# Patient Record
Sex: Male | Born: 1975
Health system: Southern US, Community
[De-identification: ages and names within clinical notes are randomized; demographics above are authoritative.]

## PROBLEM LIST (undated history)

## (undated) DIAGNOSIS — T7840XA Allergy, unspecified, initial encounter: Secondary | ICD-10-CM

## (undated) DIAGNOSIS — J45909 Unspecified asthma, uncomplicated: Secondary | ICD-10-CM

## (undated) DIAGNOSIS — L4050 Arthropathic psoriasis, unspecified: Secondary | ICD-10-CM

## (undated) DIAGNOSIS — K219 Gastro-esophageal reflux disease without esophagitis: Secondary | ICD-10-CM

## (undated) DIAGNOSIS — F419 Anxiety disorder, unspecified: Secondary | ICD-10-CM

## (undated) HISTORY — DX: Unspecified asthma, uncomplicated: J45.909

## (undated) HISTORY — DX: Gastro-esophageal reflux disease without esophagitis: K21.9

## (undated) HISTORY — DX: Allergy, unspecified, initial encounter: T78.40XA

## (undated) HISTORY — DX: Anxiety disorder, unspecified: F41.9

## (undated) HISTORY — DX: Arthropathic psoriasis, unspecified: L40.50

---

## 2003-02-26 ENCOUNTER — Emergency Department (HOSPITAL_COMMUNITY): Admission: EM | Admit: 2003-02-26 | Discharge: 2003-02-26 | Payer: Self-pay | Admitting: Emergency Medicine

## 2003-02-26 ENCOUNTER — Encounter: Payer: Self-pay | Admitting: Emergency Medicine

## 2006-05-04 ENCOUNTER — Emergency Department (HOSPITAL_COMMUNITY): Admission: EM | Admit: 2006-05-04 | Discharge: 2006-05-04 | Payer: Self-pay | Admitting: Emergency Medicine

## 2007-03-20 ENCOUNTER — Emergency Department (HOSPITAL_COMMUNITY): Admission: EM | Admit: 2007-03-20 | Discharge: 2007-03-20 | Payer: Self-pay | Admitting: Emergency Medicine

## 2008-05-19 ENCOUNTER — Ambulatory Visit: Payer: Self-pay | Admitting: Family Medicine

## 2008-05-19 DIAGNOSIS — F411 Generalized anxiety disorder: Secondary | ICD-10-CM | POA: Insufficient documentation

## 2008-05-19 DIAGNOSIS — J45909 Unspecified asthma, uncomplicated: Secondary | ICD-10-CM | POA: Insufficient documentation

## 2009-02-07 ENCOUNTER — Ambulatory Visit: Payer: Self-pay | Admitting: Family Medicine

## 2009-02-07 DIAGNOSIS — L723 Sebaceous cyst: Secondary | ICD-10-CM | POA: Insufficient documentation

## 2010-03-07 ENCOUNTER — Ambulatory Visit: Payer: Self-pay | Admitting: Family Medicine

## 2010-03-26 ENCOUNTER — Emergency Department (HOSPITAL_COMMUNITY): Admission: EM | Admit: 2010-03-26 | Discharge: 2010-03-26 | Payer: Self-pay | Admitting: Emergency Medicine

## 2010-09-24 NOTE — Assessment & Plan Note (Signed)
Summary: RENEW-CHECK CYST ON BACK//CCM   Vital Signs:  Patient profile:   35 year old male Height:      72.25 inches Weight:      179 pounds BMI:     24.20 Temp:     98.3 degrees F oral BP sitting:   120 / 80  (left arm) Cuff size:   regular  Vitals Entered By: Kern Reap CMA (February 07, 2009 11:53 AM)  Reason for Visit Concerns,cyst on back x 2-3 years  History of Present Illness: Alan Brooks is a 35 year old male, who comes in today for evaluation of 3 problems.  He has a history of panic attacks.  Is currently on 20 mg of Celexa daily.  The major panic attacks and stopped however, is having many panic attacks.  Once to discuss increasing the medication.  He can't get on an airplane.  He has a bump in the middle of his back he would like evaluated.  He also has a history of asthma.  He's been an intermittent smoker, however, he stopped smoking now for two weeks.  He takes Advair one puff b.i.d..  Allergies (verified): No Known Drug Allergies PMH-FH-SH reviewed-no changes except otherwise noted  Family History: Reviewed history from 05/19/2008 and no changes required. Family History of Anxiety Family History Depression  Social History: Reviewed history from 05/19/2008 and no changes required. Occupation: Single Former Smoker Alcohol use-yes Drug use-no Regular exercise-yes  Review of Systems      See HPI  Physical Exam  General:  Well-developed,well-nourished,in no acute distress; alert,appropriate and cooperative throughout examination Skin:  sebaceous cyst on his back.  The lesion was squeezed the fluid was excised Psych:  Cognition and judgment appear intact. Alert and cooperative with normal attention span and concentration. No apparent delusions, illusions, hallucinations   Impression & Recommendations:  Problem # 1:  ASTHMA (ICD-493.90) Assessment Improved  His updated medication list for this problem includes:    Advair Diskus 250-50 Mcg/dose Misc  (Fluticasone-salmeterol) ..... Use two times a day    Proventil Hfa 108 (90 Base) Mcg/act Aers (Albuterol sulfate) .Marland Kitchen... As needed  Orders: Prescription Created Electronically [phone])  Problem # 2:  ANXIETY (ICD-300.00) Assessment: Improved  His updated medication list for this problem includes:    Celexa 20 Mg Tabs (Citalopram hydrobromide) .Marland Kitchen... 1 tab @ bedtime    Celexa 20 Mg Tabs (Citalopram hydrobromide) .Marland Kitchen... 1 & 1/2 at bedtime  Orders: Prescription Created Electronically [phone])  Problem # 3:  SEBACEOUS CYST (ICD-706.2) Assessment: New  Orders: Prescription Created Electronically [phone])  Complete Medication List: 1)  Advair Diskus 250-50 Mcg/dose Misc (Fluticasone-salmeterol) .... Use two times a day 2)  Proventil Hfa 108 (90 Base) Mcg/act Aers (Albuterol sulfate) .... As needed 3)  Celexa 20 Mg Tabs (Citalopram hydrobromide) .Marland Kitchen.. 1 tab @ bedtime 4)  Celexa 20 Mg Tabs (Citalopram hydrobromide) .Marland Kitchen.. 1 & 1/2 at bedtime  Patient Instructions: 1)  continue the Advair one puff twice a day.  When she is not smoked for a year.  Will discuss stopping the Advair. 2)  Increase the Celexa to 30 mg a day.  If after 3 weeks.  u  don't feel  increase the dose to 40 mg a day.  In other words we will increase it by 10 mg every 3 weeks and until we get to a  max dose of 80 mg daily. Prescriptions: ADVAIR DISKUS 250-50 MCG/DOSE MISC (FLUTICASONE-SALMETEROL) use two times a day  #3 x 3   Entered and  Authorized by:   Roderick Pee MD   Signed by:   Roderick Pee MD on 02/07/2009   Method used:   Print then Give to Patient   RxID:   [id number] CELEXA 20 MG TABS (CITALOPRAM HYDROBROMIDE) 1 & 1/2 at bedtime  #150 x 3   Entered and Authorized by:   Roderick Pee MD   Signed by:   Roderick Pee MD on 02/07/2009   Method used:   Print then Give to Patient   RxID:   [id number]

## 2010-09-24 NOTE — Assessment & Plan Note (Signed)
Summary: new anxiety/mhf   Vital Signs:  Patient Profile:   35 Years Old Male Height:     72.25 inches Weight:      179 pounds Temp:     98.6 degrees F oral Pulse rate:   60 / minute Pulse rhythm:   regular BP sitting:   120 / 80  (left arm) Cuff size:   regular  Vitals Entered By: Kern Reap CMA (May 19, 2008 4:04 PM)                 Chief Complaint:  cpx.  History of Present Illness: Alan Brooks is a 35 year old single male comes in today as a new patient for evaluation of asthma and panic attacks.  He was diagnosed with asthma at age 54.  Four months ago.  He quit smoking.  His asthma has improved.  He currently takes Advair 250 -- 50.  He takes one puff twice a day.  Also, albuterol, one or two puffs p.r.n. he was instructed that two canisters of albuterol in a 20-month basis represents fairly good control in a sling over the knees to be reevaluated.  He was also reassured that his lungs will heal over the next two years and at some point, we may be able to discontinue the Advair.  However, continue it for now.  He had a long history of anxiety and depression and panic attacks.  He's been given Xanax by another physician but it doesn't help.  His spells about once a month.  He also has issues with heights air plane travel.  It one time he tried to treat it with alcohol.  He realized that his work.  Now he only has one or two beers per week.  Does not use any street drugs.  Positive family history for anxiety, depression    Current Allergies: No known allergies   Past Medical History:    Reviewed history and no changes required:       Anxiety       Asthma   Family History:    Family History of Anxiety    Family History Depression  Social History:    Reviewed history and no changes required:       Occupation:       Single       Former Smoker       Alcohol use-yes       Drug use-no       Regular exercise-yes   Risk Factors:  Tobacco use:  quit  Year quit:  09 Drug use:  no Alcohol use:  yes Exercise:  yes   Review of Systems      See HPI   Physical Exam  General:     Well-developed,well-nourished,in no acute distress; alert,appropriate and cooperative throughout examination Head:     Normocephalic and atraumatic without obvious abnormalities. No apparent alopecia or balding. Eyes:     No corneal or conjunctival inflammation noted. EOMI. Perrla. Funduscopic exam benign, without hemorrhages, exudates or papilledema. Vision grossly normal. Ears:     External ear exam shows no significant lesions or deformities.  Otoscopic examination reveals clear canals, tympanic membranes are intact bilaterally without bulging, retraction, inflammation or discharge. Hearing is grossly normal bilaterally. Nose:     External nasal examination shows no deformity or inflammation. Nasal mucosa are pink and moist without lesions or exudates. Mouth:     Oral mucosa and oropharynx without lesions or exudates.  Teeth in good repair. Neck:  No deformities, masses, or tenderness noted. Chest Wall:     No deformities, masses, tenderness or gynecomastia noted. Breasts:     No masses or gynecomastia noted Lungs:     Normal respiratory effort, chest expands symmetrically. Lungs are clear to auscultation, no crackles or wheezes. Heart:     Normal rate and regular rhythm. S1 and S2 normal without gallop, murmur, click, rub or other extra sounds. Psych:     Cognition and judgment appear intact. Alert and cooperative with normal attention span and concentration. No apparent delusions, illusions, hallucinations    Impression & Recommendations:  Problem # 1:  ANXIETY (ICD-300.00) Assessment: New  His updated medication list for this problem includes:    Celexa 20 Mg Tabs (Citalopram hydrobromide) .Marland Kitchen... 1 tab @ bedtime   Problem # 2:  ASTHMA (ICD-493.90) Assessment: New  His updated medication list for this problem includes:    Advair Diskus  250-50 Mcg/dose Misc (Fluticasone-salmeterol) ..... Use two times a day    Proventil Hfa 108 (90 Base) Mcg/act Aers (Albuterol sulfate) .Marland Kitchen... As needed   Complete Medication List: 1)  Advair Diskus 250-50 Mcg/dose Misc (Fluticasone-salmeterol) .... Use two times a day 2)  Proventil Hfa 108 (90 Base) Mcg/act Aers (Albuterol sulfate) .... As needed 3)  Celexa 20 Mg Tabs (Citalopram hydrobromide) .Marland Kitchen.. 1 tab @ bedtime   Patient Instructions: 1)  begin Celexa 20 mg one tablet at bedtime.  If after 4 weeks your feeling well.  Continue that dose and return to see me in one year.  If not, we increase by 10 mg per month until either the medication helps or we get to a max dose of 80 mg daily.  If you need to increase the medication and need a new prescription call and leave a Engineer, technical sales for [place]. 2)  Also, continue the Advair 1 puff b.i.d., and the Proventil p.r.n. remember, less than two canisters of albuterol in a 12 month time frame indicates good control   Prescriptions: PROVENTIL HFA 108 (90 BASE) MCG/ACT AERS (ALBUTEROL SULFATE) as needed  #1 x 1   Entered and Authorized by:   Roderick Pee MD   Signed by:   Roderick Pee MD on 05/19/2008   Method used:   Print then Give to Patient   RxID:   [id number] ADVAIR DISKUS 250-50 MCG/DOSE MISC (FLUTICASONE-SALMETEROL) use two times a day  #3 x 3   Entered and Authorized by:   Roderick Pee MD   Signed by:   Roderick Pee MD on 05/19/2008   Method used:   Print then Give to Patient   RxID:   [id number] CELEXA 20 MG TABS (CITALOPRAM HYDROBROMIDE) 1 tab @ bedtime  #100 x 3   Entered and Authorized by:   Roderick Pee MD   Signed by:   Roderick Pee MD on 05/19/2008   Method used:   Print then Give to Patient   RxID:   [id number]  ]

## 2010-09-24 NOTE — Assessment & Plan Note (Signed)
Summary: fu on meds/njr   Vital Signs:  Patient profile:   35 year old male Weight:      188 pounds BMI:     25.41 Temp:     98.4 degrees F oral BP sitting:   120 / 80  (left arm) Cuff size:   regular  Vitals Entered By: Kern Reap CMA Duncan Dull) (March 07, 2010 1:47 PM) CC: follow-up visit   CC:  follow-up visit.  History of Present Illness: Alan Brooks is a 35 year old male, who comes in today for evaluation of asthma and mild anxiety attacks.  He began having asthma  when he was about 35 years of age.  Two years ago.  He tried stopping his medication however, his asthma returned.  He currently uses Advair 250 50,,,,, 1 puff  b.i.d., and only uses albuterol p.r.n. in fact, he only uses one can every 12 months.  He also takes Celexa 30 mg daily because of panic attacks.  Allergies: No Known Drug Allergies  Past History:  Past medical, surgical, family and social histories (including risk factors) reviewed, and no changes noted (except as noted below).  Past Medical History: Reviewed history from 05/19/2008 and no changes required. Anxiety Asthma  Family History: Reviewed history from 05/19/2008 and no changes required. Family History of Anxiety Family History Depression  Social History: Reviewed history from 05/19/2008 and no changes required. Occupation: Single Former Smoker Alcohol use-yes Drug use-no Regular exercise-yes  Review of Systems      See HPI  Physical Exam  General:  Well-developed,well-nourished,in no acute distress; alert,appropriate and cooperative throughout examination Neck:  No deformities, masses, or tenderness noted. Lungs:  Normal respiratory effort, chest expands symmetrically. Lungs are clear to auscultation, no crackles or wheezes. Psych:  Cognition and judgment appear intact. Alert and cooperative with normal attention span and concentration. No apparent delusions, illusions, hallucinations   Impression & Recommendations:  Problem #  1:  ASTHMA (ICD-493.90) Assessment Improved  His updated medication list for this problem includes:    Advair Diskus 250-50 Mcg/dose Misc (Fluticasone-salmeterol) ..... Use two times a day    Proventil Hfa 108 (90 Base) Mcg/act Aers (Albuterol sulfate) .Marland Kitchen... As needed  Orders: Prescription Created Electronically [phone])  Problem # 2:  ANXIETY (ICD-300.00) Assessment: Improved  The following medications were removed from the medication list:    Celexa 20 Mg Tabs (Citalopram hydrobromide) .Marland Kitchen... 1 tab @ bedtime His updated medication list for this problem includes:    Celexa 20 Mg Tabs (Citalopram hydrobromide) .Marland Kitchen... 1 & 1/2 at bedtime  Orders: Prescription Created Electronically [phone])  Complete Medication List: 1)  Advair Diskus 250-50 Mcg/dose Misc (Fluticasone-salmeterol) .... Use two times a day 2)  Proventil Hfa 108 (90 Base) Mcg/act Aers (Albuterol sulfate) .... As needed 3)  Celexa 20 Mg Tabs (Citalopram hydrobromide) .Marland Kitchen.. 1 & 1/2 at bedtime  Patient Instructions: 1)  try decreasing the Advair to one puff daily.  If however, he began to have more symptoms, increased back to one puff twice daily. 2)  Continue the Celexa as directed. 3)  Return in one year for follow-up/prn  4)  Remember to get your flu shot in the fall Prescriptions: CELEXA 20 MG TABS (CITALOPRAM HYDROBROMIDE) 1 & 1/2 at bedtime  #150 x 3   Entered and Authorized by:   Roderick Pee MD   Signed by:   Roderick Pee MD on 03/07/2010   Method used:   Electronically to        [facility]  [address] [phone]* (retail)       [address]       [place], [place]  [address]       Ph: [phone] or [phone]       Fax: [phone]   RxID:   [id number] PROVENTIL HFA 108 (90 BASE) MCG/ACT AERS (ALBUTEROL SULFATE) as needed  #6.7 Inhaler x 1   Entered and Authorized by:   Roderick Pee MD   Signed by:   Roderick Pee MD on 03/07/2010   Method used:   [facility]ronically to        [facility]  [address]  [phone]* (retail)       [address]       [place], [place]  [address]       Ph: [phone] or [phone]       Fax: [phone]   RxID:   [id number] ADVAIR DISKUS 250-50 MCG/DOSE MISC (FLUTICASONE-SALMETEROL) use two times a day  #3 units x 4   Entered and Authorized by:   Roderick Pee MD   Signed by:   Roderick Pee MD on 03/07/2010   Method used:   [facility]        [facility]  [address] [phone]* (retail)       [address]       [place], [place]  [address]       Ph: [phone] or [phone]       Fax: [phone]   RxID:   [id number]

## 2010-12-06 ENCOUNTER — Other Ambulatory Visit: Payer: Self-pay | Admitting: Family Medicine

## 2011-03-13 ENCOUNTER — Other Ambulatory Visit: Payer: Self-pay | Admitting: Family Medicine

## 2011-06-25 ENCOUNTER — Other Ambulatory Visit: Payer: Self-pay | Admitting: Family Medicine

## 2011-08-04 ENCOUNTER — Ambulatory Visit (INDEPENDENT_AMBULATORY_CARE_PROVIDER_SITE_OTHER): Payer: BC Managed Care – PPO

## 2011-08-04 DIAGNOSIS — S8010XA Contusion of unspecified lower leg, initial encounter: Secondary | ICD-10-CM

## 2011-08-04 DIAGNOSIS — M79609 Pain in unspecified limb: Secondary | ICD-10-CM

## 2011-11-18 ENCOUNTER — Encounter: Payer: Self-pay | Admitting: Family Medicine

## 2011-11-18 ENCOUNTER — Ambulatory Visit (INDEPENDENT_AMBULATORY_CARE_PROVIDER_SITE_OTHER): Payer: BC Managed Care – PPO | Admitting: Family Medicine

## 2011-11-18 VITALS — BP 110/76 | Temp 98.1°F | Ht 73.0 in | Wt 184.0 lb

## 2011-11-18 DIAGNOSIS — F17200 Nicotine dependence, unspecified, uncomplicated: Secondary | ICD-10-CM

## 2011-11-18 DIAGNOSIS — Z23 Encounter for immunization: Secondary | ICD-10-CM

## 2011-11-18 DIAGNOSIS — J45909 Unspecified asthma, uncomplicated: Secondary | ICD-10-CM

## 2011-11-18 DIAGNOSIS — Z720 Tobacco use: Secondary | ICD-10-CM

## 2011-11-18 LAB — BASIC METABOLIC PANEL
BUN: 11 mg/dL (ref 6–23)
CO2: 28 mEq/L (ref 19–32)
Calcium: 9.4 mg/dL (ref 8.4–10.5)
Chloride: 103 mEq/L (ref 96–112)
Creatinine, Ser: 0.9 mg/dL (ref 0.4–1.5)
GFR: 97.72 mL/min (ref >60.00)
Glucose, Bld: 109 mg/dL — ABNORMAL HIGH (ref 70–99)
Potassium: 4.3 mEq/L (ref 3.5–5.1)
Sodium: 141 mEq/L (ref 135–145)

## 2011-11-18 LAB — POCT URINALYSIS DIPSTICK
Bilirubin, UA: NEGATIVE
Blood, UA: NEGATIVE
Glucose, UA: NEGATIVE
Ketones, UA: NEGATIVE
Leukocytes, UA: NEGATIVE
Nitrite, UA: NEGATIVE
Protein, UA: NEGATIVE
Spec Grav, UA: 1.025
Urobilinogen, UA: 0.2
pH, UA: 6.5

## 2011-11-18 LAB — LIPID PANEL
Cholesterol: 176 mg/dL (ref 0–200)
HDL: 44.5 mg/dL (ref >39.00)
LDL Cholesterol: 108 mg/dL — ABNORMAL HIGH (ref 0–99)
Total CHOL/HDL Ratio: 4
Triglycerides: 119 mg/dL (ref 0.0–149.0)
VLDL: 23.8 mg/dL (ref 0.0–40.0)

## 2011-11-18 LAB — HEPATIC FUNCTION PANEL
ALT: 18 U/L (ref 0–53)
AST: 22 U/L (ref 0–37)
Albumin: 4.2 g/dL (ref 3.5–5.2)
Alkaline Phosphatase: 50 U/L (ref 39–117)
Bilirubin, Direct: 0 mg/dL (ref 0.0–0.3)
Total Bilirubin: 0.5 mg/dL (ref 0.3–1.2)
Total Protein: 7.4 g/dL (ref 6.0–8.3)

## 2011-11-18 LAB — CBC WITH DIFFERENTIAL/PLATELET
Basophils Absolute: 0 10*3/uL (ref 0.0–0.1)
Basophils Relative: 0.6 % (ref 0.0–3.0)
Eosinophils Absolute: 0.6 10*3/uL (ref 0.0–0.7)
Eosinophils Relative: 9 % — ABNORMAL HIGH (ref 0.0–5.0)
HCT: 46.8 % (ref 39.0–52.0)
Hemoglobin: 15.7 g/dL (ref 13.0–17.0)
Lymphocytes Relative: 26.9 % (ref 12.0–46.0)
Lymphs Abs: 1.7 10*3/uL (ref 0.7–4.0)
MCHC: 33.5 g/dL (ref 30.0–36.0)
MCV: 98.5 fl (ref 78.0–100.0)
Monocytes Absolute: 0.5 10*3/uL (ref 0.1–1.0)
Monocytes Relative: 7.6 % (ref 3.0–12.0)
Neutro Abs: 3.5 10*3/uL (ref 1.4–7.7)
Neutrophils Relative %: 55.9 % (ref 43.0–77.0)
Platelets: 200 10*3/uL (ref 150.0–400.0)
RBC: 4.75 Mil/uL (ref 4.22–5.81)
RDW: 13.6 % (ref 11.5–14.6)
WBC: 6.2 10*3/uL (ref 4.5–10.5)

## 2011-11-18 LAB — TSH: TSH: 1.09 u[IU]/mL (ref 0.35–5.50)

## 2011-11-18 MED ORDER — VARENICLINE TARTRATE 1 MG PO TABS: ORAL_TABLET | ORAL | Status: DC

## 2011-11-18 MED ORDER — CITALOPRAM HYDROBROMIDE 20 MG PO TABS: ORAL_TABLET | ORAL | Status: DC

## 2011-11-18 MED ORDER — FLUTICASONE-SALMETEROL 250-50 MCG/DOSE IN AEPB: 1.0000 | INHALATION_SPRAY | Freq: Two times a day (BID) | RESPIRATORY_TRACT | Status: DC

## 2011-11-18 MED ORDER — ALBUTEROL SULFATE HFA 108 (90 BASE) MCG/ACT IN AERS: 2.0000 | INHALATION_SPRAY | Freq: Four times a day (QID) | RESPIRATORY_TRACT | Status: DC | PRN

## 2011-11-18 NOTE — Patient Instructions (Signed)
Increase the Advair to one puff twice daily  Continue the Celexa  Start the Chantix program one half tab daily in the morning and taper as outlined  Return in 6 weeks for followup

## 2011-11-18 NOTE — Progress Notes (Signed)
  Subjective:    Patient ID: Alan Brooks, male    DOB: [birth year], 36 y.o.   MRN: [id number]  HPI Alan Brooks is a 36 year old male who comes in today for general physical examination because of a history of allergic rhinitis, mild depression, and tobacco abuse  He's currently using Advair 1 puff daily and albuterol when necessary he continues to smoke 20 cigarettes a day. We will outline a smoking cessation program  He takes Celexa 30 mg each bedtime for mild depression and states he's doing well emotionally.  Tetanus booster unknown given today   Review of Systems  Constitutional: Negative.   HENT: Negative.   Eyes: Negative.   Respiratory: Negative.   Cardiovascular: Negative.   Gastrointestinal: Negative.   Genitourinary: Negative.   Musculoskeletal: Negative.   Skin: Negative.   Neurological: Negative.   Hematological: Negative.   Psychiatric/Behavioral: Negative.        Objective:   Physical Exam  Constitutional: He is oriented to person, place, and time. He appears well-developed and well-nourished.  HENT:  Head: Normocephalic and atraumatic.  Right Ear: External ear normal.  Left Ear: External ear normal.  Nose: Nose normal.  Mouth/Throat: Oropharynx is clear and moist.  Eyes: Conjunctivae and EOM are normal. Pupils are equal, round, and reactive to light.  Neck: Normal range of motion. Neck supple. No JVD present. No tracheal deviation present. No thyromegaly present.  Cardiovascular: Normal rate, regular rhythm, normal heart sounds and intact distal pulses.  Exam reveals no gallop and no friction rub.   No murmur heard. Pulmonary/Chest: Effort normal and breath sounds normal. No stridor. No respiratory distress. He has no wheezes. He has no rales. He exhibits no tenderness.  Abdominal: Soft. Bowel sounds are normal. He exhibits no distension and no mass. There is no tenderness. There is no rebound and no guarding.  Genitourinary: Penis normal. No penile  tenderness.  Musculoskeletal: Normal range of motion. He exhibits no edema and no tenderness.  Lymphadenopathy:    He has no cervical adenopathy.  Neurological: He is alert and oriented to person, place, and time. He has normal reflexes. No cranial nerve deficit. He exhibits normal muscle tone.  Skin: Skin is warm and dry. No rash noted. No erythema. No pallor.  Psychiatric: He has a normal mood and affect. His behavior is normal. Judgment and thought content normal.   Lungs clear except for some mild late expiratory wheezing       Assessment & Plan:  Healthy male  History of allergic asthma plan increase Advair to one puff twice daily smoking cessation program outlined followup in 6 weeks  History of mild depression continue Celexa 30 mg daily

## 2011-11-20 ENCOUNTER — Telehealth: Payer: Self-pay | Admitting: Family Medicine

## 2011-11-20 NOTE — Telephone Encounter (Signed)
Patient is aware of lab results and copy mailed 

## 2011-11-20 NOTE — Telephone Encounter (Signed)
Pt would like blood work results °

## 2011-12-30 ENCOUNTER — Ambulatory Visit: Payer: BC Managed Care – PPO | Admitting: Family Medicine

## 2012-01-08 ENCOUNTER — Ambulatory Visit (INDEPENDENT_AMBULATORY_CARE_PROVIDER_SITE_OTHER): Payer: BC Managed Care – PPO | Admitting: Family Medicine

## 2012-01-08 ENCOUNTER — Encounter: Payer: Self-pay | Admitting: Family Medicine

## 2012-01-08 VITALS — BP 102/68 | Temp 97.7°F | Wt 180.0 lb

## 2012-01-08 DIAGNOSIS — F17200 Nicotine dependence, unspecified, uncomplicated: Secondary | ICD-10-CM

## 2012-01-08 DIAGNOSIS — J45909 Unspecified asthma, uncomplicated: Secondary | ICD-10-CM

## 2012-01-08 DIAGNOSIS — G5602 Carpal tunnel syndrome, left upper limb: Secondary | ICD-10-CM

## 2012-01-08 DIAGNOSIS — Z720 Tobacco use: Secondary | ICD-10-CM

## 2012-01-08 DIAGNOSIS — G5600 Carpal tunnel syndrome, unspecified upper limb: Secondary | ICD-10-CM

## 2012-01-08 NOTE — Progress Notes (Signed)
  Subjective:    Patient ID: Alan Brooks, male    DOB: 05/07/1976, 36 y.o.   MRN: [id number]  HPI Alan Brooks is a 36 year old male smoker with asthma who comes in today for followup  We have placed him on a smoking cessation program and a half a tablet Chantix daily he has decreased his tobacco consumption down to 10 cigarettes a day  He also has tingling in his left index and middle finger. 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  He also has a history of asthma and is on Advair 1 puff twice a day albuterol when necessary and still having issues of asthma because of the fact that he continues to smoke and also the extrinsic triggers of asthma which are the pollen.   Review of Systems General and pulmonary and neurologic review of systems otherwise negative    Objective:   Physical Exam  Well-developed and nourished man in acute distress      Assessment & Plan:  Tobacco abuse increase the Chantix to one half tablet twice daily as outlined a tapering program followup in 6 weeks  Carpal tunnel syndrome night splint  Asthma continue inhalers followup in 6 weeks

## 2012-01-08 NOTE — Patient Instructions (Signed)
Increase the Chantix to one half tablet twice daily  Taper by 2 cigarettes per week  Followup in 6 weeks  Continue your inhalers  Wear the night splint on your left arm nightly

## 2012-02-19 ENCOUNTER — Ambulatory Visit (INDEPENDENT_AMBULATORY_CARE_PROVIDER_SITE_OTHER): Payer: BC Managed Care – PPO | Admitting: Family Medicine

## 2012-02-19 ENCOUNTER — Encounter: Payer: Self-pay | Admitting: Family Medicine

## 2012-02-19 VITALS — BP 100/70 | Temp 98.1°F | Wt 186.0 lb

## 2012-02-19 DIAGNOSIS — J45909 Unspecified asthma, uncomplicated: Secondary | ICD-10-CM

## 2012-02-19 DIAGNOSIS — G5600 Carpal tunnel syndrome, unspecified upper limb: Secondary | ICD-10-CM

## 2012-02-19 DIAGNOSIS — Z720 Tobacco use: Secondary | ICD-10-CM

## 2012-02-19 DIAGNOSIS — F17200 Nicotine dependence, unspecified, uncomplicated: Secondary | ICD-10-CM

## 2012-02-19 DIAGNOSIS — G5602 Carpal tunnel syndrome, left upper limb: Secondary | ICD-10-CM

## 2012-02-19 NOTE — Progress Notes (Signed)
  Subjective:    Patient ID: Alan Brooks, male    DOB: 1976/05/11, 36 y.o.   MRN: [id number]  HPI Alan Brooks is a 36 year old male who comes in today for followup of smoking cessation  He has a history of underlying allergic rhinitis and asthma and we started him on a smoking cessation program 2 months ago. He's on Chantix program one half tablet twice daily and has been off nicotine completely for 4 weeks except for one day when he smokes 10 cigarettes.  He's wearing his splint for the carpal tunnel syndrome   Review of Systems General and pulmonary review of systems otherwise negative will    Objective:   Physical Exam Well-developed well-nourished male in no acute distress       Assessment & Plan:  Tobacco abuse plan Taper [facility] continue avoiding nicotine products

## 2012-02-19 NOTE — Patient Instructions (Addendum)
Chantix program,,,,,,,,,,, one half tablet twice daily for one month then one half tablet daily for one month then one half tablet Monday Wednesday Friday for one month  Return when necessary

## 2013-01-18 ENCOUNTER — Other Ambulatory Visit: Payer: Self-pay | Admitting: Family Medicine

## 2013-03-02 ENCOUNTER — Other Ambulatory Visit: Payer: Self-pay | Admitting: Family Medicine

## 2013-03-09 ENCOUNTER — Ambulatory Visit: Payer: BC Managed Care – PPO

## 2013-03-09 ENCOUNTER — Ambulatory Visit (INDEPENDENT_AMBULATORY_CARE_PROVIDER_SITE_OTHER): Payer: BC Managed Care – PPO | Admitting: Family Medicine

## 2013-03-09 VITALS — BP 110/78 | HR 88 | Temp 98.0°F | Resp 16 | Ht 72.0 in | Wt 183.0 lb

## 2013-03-09 DIAGNOSIS — S50311A Abrasion of right elbow, initial encounter: Secondary | ICD-10-CM

## 2013-03-09 DIAGNOSIS — M25521 Pain in right elbow: Secondary | ICD-10-CM

## 2013-03-09 DIAGNOSIS — IMO0002 Reserved for concepts with insufficient information to code with codable children: Secondary | ICD-10-CM

## 2013-03-09 DIAGNOSIS — M25529 Pain in unspecified elbow: Secondary | ICD-10-CM
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
## 2013-04-13 ENCOUNTER — Other Ambulatory Visit: Payer: Self-pay | Admitting: Family Medicine

## 2013-04-14 ENCOUNTER — Encounter: Payer: Self-pay | Admitting: Internal Medicine

## 2013-04-14 ENCOUNTER — Ambulatory Visit (INDEPENDENT_AMBULATORY_CARE_PROVIDER_SITE_OTHER): Payer: BC Managed Care – PPO | Admitting: Internal Medicine

## 2013-04-14 VITALS — BP 120/90 | HR 119 | Temp 98.4°F | Wt 186.0 lb

## 2013-04-14 DIAGNOSIS — F17200 Nicotine dependence, unspecified, uncomplicated: Secondary | ICD-10-CM

## 2013-04-14 DIAGNOSIS — M7021 Olecranon bursitis, right elbow: Secondary | ICD-10-CM

## 2013-04-14 DIAGNOSIS — J45909 Unspecified asthma, uncomplicated: Secondary | ICD-10-CM

## 2013-04-14 DIAGNOSIS — M7020 Olecranon bursitis, unspecified elbow: Secondary | ICD-10-CM

## 2013-04-14 DIAGNOSIS — Z720 Tobacco use: Secondary | ICD-10-CM

## 2013-04-14 MED ORDER — VARENICLINE TARTRATE 1 MG PO TABS: ORAL_TABLET | ORAL | Status: DC

## 2013-04-14 NOTE — Patient Instructions (Addendum)
Take Aleve 200 mg twice daily for pain or swelling  Call or return to clinic prn if these symptoms worsen or fail to improve as anticipated.  

## 2013-04-14 NOTE — Progress Notes (Signed)
  Subjective:    Patient ID: Alan Brooks, male    DOB: [birth year], 37 y.o.   MRN: [id number]  HPI  37 year old patient who sustained trauma to the right elbow approximately 6 weeks ago. He had x-rays performed on July 16 which were negative for acute fracture. He has had persistent discomfort and swelling about the right elbow.  X-rays were reviewed  Past Medical History  Diagnosis Date  . Anxiety   . Asthma     History   Social History  . Marital Status: Single    Spouse Name: N/A    Number of Children: N/A  . Years of Education: N/A   Occupational History  . Not on file.   Social History Main Topics  . Smoking status: Current Every Day Smoker -- 1.00 packs/day for 15 years    Types: Cigarettes  . Smokeless tobacco: Not on file  . Alcohol Use: Yes  . Drug Use: No  . Sexual Activity: Not on file   Other Topics Concern  . Not on file   Social History Narrative  . No narrative on file    History reviewed. No pertinent past surgical history.  Family History  Problem Relation Age of Onset  . Asthma Other   . Depression Other   . Anxiety disorder Other     Not on File  Current Outpatient Prescriptions on File Prior to Visit  Medication Sig Dispense Refill  . ADVAIR DISKUS 250-50 MCG/DOSE AEPB INHALE 1 PUFF INTO THE LUNGS 2 (TWO) TIMES DAILY.  180 each  2  . citalopram (CELEXA) 20 MG tablet TAKE 1 AND 1/2 TABLETS BY MOUTH AT BEDTIME  150 tablet  0  . PROVENTIL HFA 108 (90 BASE) MCG/ACT inhaler USE AS DIRECTED AS NEEDED  20.1 each  0   No current facility-administered medications on file prior to visit.    BP 120/90  Pulse 119  Temp(Src) 98.4 F (36.9 C) (Oral)  Wt 186 lb (84.369 kg)  BMI 25.22 kg/m2  SpO2 97%       Review of Systems  Musculoskeletal:       Right elbow pain and swelling       Objective:   Physical Exam  Constitutional: He appears well-developed and well-nourished. No distress.  Musculoskeletal:  The right olecranon  bursa was slightly swollen, mildly tender. No erythema or excessive warmth          Assessment & Plan:   Traumatic olecranon bursitis.  The patient will attempt to avoid overuse activities. He'll take Aleve twice daily. If no improvement in 2-3 weeks or symptoms worsen he will be set up for orthopedic evaluation or cortisone injection

## 2013-05-16 ENCOUNTER — Ambulatory Visit (INDEPENDENT_AMBULATORY_CARE_PROVIDER_SITE_OTHER): Payer: BC Managed Care – PPO | Admitting: Family Medicine

## 2013-05-16 ENCOUNTER — Other Ambulatory Visit: Payer: Self-pay | Admitting: Family Medicine

## 2013-05-16 ENCOUNTER — Encounter: Payer: Self-pay | Admitting: Family Medicine

## 2013-05-16 VITALS — BP 112/72 | HR 70 | Temp 97.8°F | Ht 72.0 in | Wt 186.0 lb

## 2013-05-16 DIAGNOSIS — J45909 Unspecified asthma, uncomplicated: Secondary | ICD-10-CM

## 2013-05-16 DIAGNOSIS — J209 Acute bronchitis, unspecified: Secondary | ICD-10-CM

## 2013-05-16 DIAGNOSIS — L219 Seborrheic dermatitis, unspecified: Secondary | ICD-10-CM

## 2013-05-16 DIAGNOSIS — Z720 Tobacco use: Secondary | ICD-10-CM

## 2013-05-16 DIAGNOSIS — F17200 Nicotine dependence, unspecified, uncomplicated: Secondary | ICD-10-CM

## 2013-05-16 MED ORDER — TRIAMCINOLONE ACETONIDE 0.1 % EX CREA: TOPICAL_CREAM | Freq: Two times a day (BID) | CUTANEOUS | Status: DC

## 2013-05-16 MED ORDER — AZITHROMYCIN 250 MG PO TABS: ORAL_TABLET | ORAL | Status: AC

## 2013-05-16 MED ORDER — VARENICLINE TARTRATE 1 MG PO TABS: 1.0000 mg | ORAL_TABLET | Freq: Two times a day (BID) | ORAL | Status: DC

## 2013-05-16 MED ORDER — VARENICLINE TARTRATE 1 MG PO TABS: ORAL_TABLET | ORAL | Status: DC

## 2013-05-16 NOTE — Progress Notes (Signed)
  Subjective:    Patient ID: Alan Brooks, male    DOB: 11-03-1975, 37 y.o.   MRN: [id number]  HPI Patient seen with several issues as follows  4 to five-day history of cough. Productive of green to brown sputum. No fevers or chills. No dyspnea. He has asthma treated with Advair. No significant wheezing. Minimal nasal congestion. Typical cold-like symptoms of onset 4-5 days ago.  Patient has history of seborrhea. He has areas of involvement including scalp, face, anterior chest. He's tried anti-seborrhea shampoos without much improvement. His intermittent rash is pruritic.  Patient trying to quit smoking. Currently on Chantix but needs refill. No adverse side effects. Currently smoking about one half pack cigarettes per day. Motivation to quit is moderate to high  Past Medical History  Diagnosis Date  . Anxiety   . Asthma    No past surgical history on file.  reports that he has been smoking Cigarettes.  He has a 15 pack-year smoking history. He does not have any smokeless tobacco history on file. He reports that  drinks alcohol. He reports that he does not use illicit drugs. family history includes Anxiety disorder in his other; Asthma in his other; Depression in his other. No Known Allergies    Review of Systems  Constitutional: Negative for fever, chills, activity change, appetite change, fatigue and unexpected weight change.  HENT: Positive for congestion and postnasal drip. Negative for ear pain, sore throat and trouble swallowing.   Respiratory: Positive for cough. Negative for shortness of breath, wheezing and stridor.   Cardiovascular: Negative for chest pain and leg swelling.  Gastrointestinal: Negative for abdominal pain.  Musculoskeletal: Negative for arthralgias.  Skin: Negative for rash.  Neurological: Negative for syncope and headaches.  Hematological: Negative for adenopathy.       Objective:   Physical Exam  Constitutional: He appears well-developed and  well-nourished.  HENT:  Right Ear: External ear normal.  Left Ear: External ear normal.  Mouth/Throat: Oropharynx is clear and moist.  Neck: Neck supple.  Cardiovascular: Normal rate and regular rhythm.   Pulmonary/Chest: Effort normal and breath sounds normal. No respiratory distress. He has no wheezes. He has no rales.  Skin:  Patient has non-scaly slightly erythematous minimally raised rash anterior chest          Assessment & Plan:  #1 acute bronchitis. We explained this is probably viral. No antibiotics indicated at this time but if productive cough persist consider Zithromax #2 seborrheic eczema. We discussed over-the-counter anti-seborrhea shampoos. Add triamcinolone 0.1% cream to affected areas of skin twice daily as needed #3 smoking cessation. Refill of Chantix given

## 2013-05-16 NOTE — Patient Instructions (Signed)

## 2013-10-08 ENCOUNTER — Other Ambulatory Visit: Payer: Self-pay | Admitting: Family Medicine

## 2013-12-08 ENCOUNTER — Telehealth: Payer: Self-pay | Admitting: Family Medicine

## 2013-12-08 MED ORDER — CITALOPRAM HYDROBROMIDE 40 MG PO TABS: 40.0000 mg | ORAL_TABLET | Freq: Every day | ORAL | Status: DC

## 2013-12-08 NOTE — Telephone Encounter (Signed)
Okay per Dr Todd and patient is aware. 

## 2013-12-08 NOTE — Telephone Encounter (Signed)
Pt states his psychologist Hilma FavorsMary Ann Garcia advised pt he needs to increase his citalopram (CELEXA) 30 MG tablet  to 40 mg. Would like dr todd to do this. [facility] [address]

## 2014-06-13 ENCOUNTER — Ambulatory Visit (INDEPENDENT_AMBULATORY_CARE_PROVIDER_SITE_OTHER): Payer: BC Managed Care – PPO

## 2014-06-13 ENCOUNTER — Ambulatory Visit (INDEPENDENT_AMBULATORY_CARE_PROVIDER_SITE_OTHER): Payer: BC Managed Care – PPO | Admitting: Family Medicine

## 2014-06-13 VITALS — BP 100/72 | HR 82 | Temp 98.4°F | Resp 14 | Ht 74.0 in | Wt 200.8 lb

## 2014-06-13 DIAGNOSIS — R079 Chest pain, unspecified: Secondary | ICD-10-CM

## 2014-06-13 NOTE — Progress Notes (Signed)
° °  Subjective:  This chart was scribed for Elvina SidleKurt Lauenstein, MD by Haywood PaoNadim Abu Hashem, ED Scribe. The patient was seen in Exam Room 2 info not found and the patient's care was started at 5:23 PM.   Patient ID: Alan Brooks, male    DOB: [birth year], 38 y.o.   MRN: [id number]  HPI HPI Comments: Alan Brooks is a 38 y.o. male [facility] of centralized CP. Pt notes the pain is primarily in his lungs. He takes Advil once day which he notes provides some relief. Pt does not work out. Pt notes his brother has asthma. He says he used to smoke but quit one year ago. He denies any swelling in his legs or calves. 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 Negative for leg swelling.  All other systems reviewed and are negative.      Objective:   Physical Exam  Nursing note and vitals reviewed. Constitutional: He is oriented to person, place, and time. He appears well-developed and well-nourished.  HENT:  Head: Normocephalic and atraumatic.  Eyes: EOM are normal.  Neck: Normal range of motion.  Cardiovascular: Normal rate, regular rhythm and normal heart sounds.  Exam reveals no friction rub.   No murmur heard. Pulmonary/Chest: Effort normal.  Lungs clear bilaterally   Musculoskeletal: Normal range of motion.  No calve tenderness  Neurological: He is alert and oriented to person, place, and time.  Skin: Skin is warm and dry.  Psychiatric: He has a normal mood and affect. His behavior is normal.   BP 100/72   Pulse 82   Temp(Src) 98.4 F (36.9 C) (Oral)   Resp 14   Ht 6\' 2"  (1.88 m)   Wt 200 lb 12.8 oz (91.082 kg)   BMI 25.77 kg/m2   SpO2 100%  [facility] reading (PRIMARY) by  Dr. Milus GlazierLauenstein:  No acute changes.       Assessment & Plan:  I personally performed the services described in this documentation, which was scribed in my presence. The recorded information has been reviewed and is  accurate.  Symptoms are consistent with a mucous plug. I explained this to the patient and asked him to increase the Advair to twice a day per week. These episodes of acute right-sided chest pain should resolve over the next week or he should return for further evaluation. There are no signs at this is a clot, pneumonia, or pneumothorax.  Signed, Sheila OatsKurt Lauenstein M.D.

## 2014-07-01 ENCOUNTER — Other Ambulatory Visit: Payer: Self-pay | Admitting: Family Medicine

## 2014-09-27 ENCOUNTER — Encounter: Payer: Self-pay | Admitting: Family Medicine

## 2014-09-27 ENCOUNTER — Ambulatory Visit (INDEPENDENT_AMBULATORY_CARE_PROVIDER_SITE_OTHER): Payer: BLUE CROSS/BLUE SHIELD | Admitting: Family Medicine

## 2014-09-27 VITALS — BP 120/80 | Temp 97.9°F | Wt 198.0 lb

## 2014-09-27 DIAGNOSIS — M545 Low back pain, unspecified: Secondary | ICD-10-CM | POA: Insufficient documentation

## 2014-09-27 DIAGNOSIS — L719 Rosacea, unspecified: Secondary | ICD-10-CM

## 2014-09-27 DIAGNOSIS — R1310 Dysphagia, unspecified: Secondary | ICD-10-CM

## 2014-09-27 DIAGNOSIS — M5441 Lumbago with sciatica, right side: Secondary | ICD-10-CM

## 2014-09-27 DIAGNOSIS — F411 Generalized anxiety disorder: Secondary | ICD-10-CM

## 2014-09-27 DIAGNOSIS — J4530 Mild persistent asthma, uncomplicated: Secondary | ICD-10-CM

## 2014-09-27 MED ORDER — DOXYCYCLINE HYCLATE 100 MG PO TABS: 100.0000 mg | ORAL_TABLET | Freq: Two times a day (BID) | ORAL | Status: DC

## 2014-09-27 MED ORDER — METRONIDAZOLE 1 % EX GEL: Freq: Every day | CUTANEOUS | Status: DC

## 2014-09-27 NOTE — Patient Instructions (Signed)
Soft diet  We will get you set up a consult in GI ASAP  For the rosacea........... avoid spicy foods..... Minimal amounts of caffeine and alcohol......... doxycycline one twice daily and MetroGel apply bedtime and wash off in the morning  Return in 4 weeks for reevaluation  You have what's called mechanical low back pain...Marland Kitchen.Marland Kitchen.Marland Kitchen. the treatment of which is follows......... daily Williams exercises every morning...........Marland Kitchen. Motrin 400 mg twice daily when necessary...Marland Kitchen.Marland Kitchen.. do not sit for more than 30 minutes at a time...Marland Kitchen.Marland Kitchen.. daily walking program 30 minutes  Congratulations on being an ex-smoker......... you cannot be exposed to any smoke........ your girlfriend can't smoke in the car or the house.......... stop the Advair

## 2014-09-27 NOTE — Progress Notes (Signed)
   Subjective:    Patient ID: Alan Brooks, male    DOB: September 09, 1975, 39 y.o.   MRN: [id number]  HPI Alan Brooks is a 39 year old single male......Marland Kitchen. lives with his girlfriend who smokes....Marland Kitchen.Marland Kitchen. he himself quit smoking a year ago....... who comes in today for evaluation of for problems  For the past year he's had low back pain. He points to the right and left sciatic area as a source of his pain. He states that comes and goes. When it comes on a last an hour to go away. It sometimes sharp sometimes dull. It's a 4 on a scale of 1-10  The pain is not increased or decreased by any maneuver. Bowel and bladder function normal. Occupation he sits all day. No exercise except for walking a dog. No history of trauma.  For the past 5 years she's had difficulty swallowing food solids. Over the past couple months is seems gotten worse.  He's also had a rash on his face that is exacerbated by spicy foods.  He also has a history of asthma which is really been more related to smoking than allergy. He stopped smoking a year ago. He's tapered his Advair down to 1 puff daily and feels well pulmonary wise    Review of Systems    review of systems otherwise negative Objective:   Physical Exam  Well-developed well-nourished male no acute distress vital signs stable he is afebrile examination spine in the standing position showed no scoliosis. There is some tenderness in both SI joints. Neurologic exam otherwise normal  Skin exam shows rosacea with some acne.      Assessment & Plan:  Low back pain mechanical.......... outlined treatment program of exercise  Rosacea......... doxycycline and MetroGel  X smoker 1 year.......Marland Kitchen. girlfriend a smoker but not in the car the house.......  History of asthma more related to smoking.......... stop the Advair  Difficulty swallowing solid food....Marland Kitchen.Marland Kitchen. probable mid esophageal stricture......Marland Kitchen. refer to GI for evaluation and treatment

## 2014-09-27 NOTE — Progress Notes (Signed)
Pre visit review using our clinic review tool, if applicable. No additional management support is needed unless otherwise documented below in the visit note. 

## 2014-09-29 ENCOUNTER — Encounter: Payer: Self-pay | Admitting: Physician Assistant

## 2014-09-29 ENCOUNTER — Ambulatory Visit (INDEPENDENT_AMBULATORY_CARE_PROVIDER_SITE_OTHER): Payer: BLUE CROSS/BLUE SHIELD | Admitting: Physician Assistant

## 2014-09-29 VITALS — BP 105/74 | HR 85 | Ht 73.0 in | Wt 199.4 lb

## 2014-09-29 DIAGNOSIS — K219 Gastro-esophageal reflux disease without esophagitis: Secondary | ICD-10-CM

## 2014-09-29 DIAGNOSIS — R1310 Dysphagia, unspecified: Secondary | ICD-10-CM

## 2014-09-29 MED ORDER — PANTOPRAZOLE SODIUM 40 MG PO TBEC: 40.0000 mg | DELAYED_RELEASE_TABLET | Freq: Every day | ORAL | Status: DC

## 2014-09-29 NOTE — Patient Instructions (Addendum)
You have been scheduled for an endoscopy. Please follow written instructions given to you at your visit today. If you use inhalers (even only as needed), please bring them with you on the day of your procedure. Your physician has requested that you go to www.startemmi.com and enter the access code given to you at your visit today. This web site gives a general overview about your procedure. However, you should still follow specific instructions given to you by our office regarding your preparation for the procedure.  We have sent the following medications to your pharmacy for you to pick up at your convenience: Pantoprazole 40 mg every morning 30 minutes before breakfast  Food Choices for Gastroesophageal Reflux Disease When you have gastroesophageal reflux disease (GERD), the foods you eat and your eating habits are very important. Choosing the right foods can help ease the discomfort of GERD. WHAT GENERAL GUIDELINES DO I NEED TO FOLLOW?  Choose fruits, vegetables, whole grains, low-fat dairy products, and low-fat meat, fish, and poultry.  Limit fats such as oils, salad dressings, butter, nuts, and avocado.  Keep a food diary to identify foods that cause symptoms.  Avoid foods that cause reflux. These may be different for different people.  Eat frequent small meals instead of three large meals each day.  Eat your meals slowly, in a relaxed setting.  Limit fried foods.  Cook foods using methods other than frying.  Avoid drinking alcohol.  Avoid drinking large amounts of liquids with your meals.  Avoid bending over or lying down until 2-3 hours after eating. WHAT FOODS ARE NOT RECOMMENDED? The following are some foods and drinks that may worsen your symptoms: Vegetables Tomatoes. Tomato juice. Tomato and spaghetti sauce. Chili peppers. Onion and garlic. Horseradish. Fruits Oranges, grapefruit, and lemon (fruit and juice). Meats High-fat meats, fish, and poultry. This includes  hot dogs, ribs, ham, sausage, salami, and bacon. Dairy Whole milk and chocolate milk. Sour cream. Cream. Butter. Ice cream. Cream cheese.  Beverages Coffee and tea, with or without caffeine. Carbonated beverages or energy drinks. Condiments Hot sauce. Barbecue sauce.  Sweets/Desserts Chocolate and cocoa. Donuts. Peppermint and spearmint. Fats and Oils High-fat foods, including [place] fries and potato chips. Other Vinegar. Strong spices, such as black pepper, white pepper, red pepper, cayenne, curry powder, cloves, ginger, and chili powder. The items listed above may not be a complete list of foods and beverages to avoid. Contact your dietitian for more information. Document Released: 08/11/2005 Document Revised: 08/16/2013 Document Reviewed: 06/15/2013 [facility] Patient Information 2015 [place], [place]. This information is not intended to replace advice given to you by your health care provider. Make sure you discuss any questions you have with your health care provider.  Gastroesophageal Reflux Disease, Adult Gastroesophageal reflux disease (GERD) happens when acid from your stomach flows up into the esophagus. When acid comes in contact with the esophagus, the acid causes soreness (inflammation) in the esophagus. Over time, GERD may create small holes (ulcers) in the lining of the esophagus. CAUSES   Increased body weight. This puts pressure on the stomach, making acid rise from the stomach into the esophagus.  Smoking. This increases acid production in the stomach.  Drinking alcohol. This causes decreased pressure in the lower esophageal sphincter (valve or ring of muscle between the esophagus and stomach), allowing acid from the stomach into the esophagus.  Late evening meals and a full stomach. This increases pressure and acid production in the stomach.  A malformed lower esophageal sphincter. Sometimes, no cause is  found. SYMPTOMS   Burning pain in the lower part of the  mid-chest behind the breastbone and in the mid-stomach area. This may occur twice a week or more often.  Trouble swallowing.  Sore throat.  Dry cough.  Asthma-like symptoms including chest tightness, shortness of breath, or wheezing. DIAGNOSIS  Your caregiver may be able to diagnose GERD based on your symptoms. In some cases, X-rays and other tests may be done to check for complications or to check the condition of your stomach and esophagus. TREATMENT  Your caregiver may recommend over-the-counter or prescription medicines to help decrease acid production. Ask your caregiver before starting or adding any new medicines.  HOME CARE INSTRUCTIONS   Change the factors that you can control. Ask your caregiver for guidance concerning weight loss, quitting smoking, and alcohol consumption.  Avoid foods and drinks that make your symptoms worse, such as:  Caffeine or alcoholic drinks.  Chocolate.  Peppermint or mint flavorings.  Garlic and onions.  Spicy foods.  Citrus fruits, such as oranges, lemons, or limes.  Tomato-based foods such as sauce, chili, salsa, and pizza.  Fried and fatty foods.  Avoid lying down for the 3 hours prior to your bedtime or prior to taking a nap.  Eat small, frequent meals instead of large meals.  Wear loose-fitting clothing. Do not wear anything tight around your waist that causes pressure on your stomach.  Raise the head of your bed 6 to 8 inches with wood blocks to help you sleep. Extra pillows will not help.  Only take over-the-counter or prescription medicines for pain, discomfort, or fever as directed by your caregiver.  Do not take aspirin, ibuprofen, or other nonsteroidal anti-inflammatory drugs (NSAIDs). SEEK IMMEDIATE MEDICAL CARE IF:   You have pain in your arms, neck, jaw, teeth, or back.  Your pain increases or changes in intensity or duration.  You develop nausea, vomiting, or sweating (diaphoresis).  You develop shortness of  breath, or you faint.  Your vomit is green, yellow, black, or looks like coffee grounds or blood.  Your stool is red, bloody, or black. These symptoms could be signs of other problems, such as heart disease, gastric bleeding, or esophageal bleeding. MAKE SURE YOU:   Understand these instructions.  Will watch your condition.  Will get help right away if you are not doing well or get worse. Document Released: 05/21/2005 Document Revised: 11/03/2011 Document Reviewed: 02/28/2011 [facility] Patient Information 2015 [place]. This information is not intended to replace advice given to you by your health care provider. Make sure you discuss any questions you have with your health care provider.  CC:Dr Kelle DartingJeffrey Todd

## 2014-09-29 NOTE — Progress Notes (Signed)
Patient ID: Alan Brooks J Carstens, male   DOB: [birth year], 39 y.o.   MRN: [id number]    HPI:   [name] is a 39 year old male referred for evaluation by Dr. Tawanna Coolerodd due to dysphagia.  [name] reports that he has had difficulty swallowing solids for 5-7 years. Over the past few months it has gotten progressively worse. Until then, it was primarily meats like steak and chicken that would get stuck, but yesterday he was eating pancakes and had a piece get stuck and started choking. Eventually the pancake passed down into his stomach and he was able to drink some fluid and felt better. He has had multiple episodes over the past several months where he has had to stop eating and wait for repeat her eat of time for the food bolus to pass. He feels his food is becoming lodged in the distal esophagus.  Further questioning reveals that he has frequent heartburn on a daily basis for which she has been using tums daily for at least 10 years. He does not have any nocturnal regurgitation or cough. He has no epigastric pain, nausea, or vomiting. His appetite is good and he has no early satiety. He reports that his maternal grandmother had esophageal strictures and had several dilations performed. Patient has a history of asthma and environmental allergies and in the past was on allergy meds which he has not taken in some time. He has not clearly correlated any link between a flare in his allergy symptoms associated with an increase in his dysphagia.  Past Medical History  Diagnosis Date  . Anxiety   . Asthma   . Allergy     History reviewed. No pertinent past surgical history. Family History  Problem Relation Age of Onset  . Asthma Other   . Depression Other   . Anxiety disorder Other    History  Substance Use Topics  . Smoking status: Former Smoker -- 1.00 packs/day for 15 years    Types: Cigarettes  . Smokeless tobacco: Not on file  . Alcohol Use: Yes   Current Outpatient Prescriptions  Medication Sig  Dispense Refill  . ADVAIR DISKUS 250-50 MCG/DOSE AEPB INHALE 1 PUFF INTO THE LUNGS 2 (TWO) TIMES DAILY. 180 each 0  . citalopram (CELEXA) 40 MG tablet Take 1 tablet (40 mg total) by mouth daily. 90 tablet 3  . doxycycline (VIBRA-TABS) 100 MG tablet Take 1 tablet (100 mg total) by mouth 2 (two) times daily. 200 tablet 3  . metroNIDAZOLE (METROGEL) 1 % gel Apply topically daily. 60 g 5  . PROVENTIL HFA 108 (90 BASE) MCG/ACT inhaler USE AS DIRECTED AS NEEDED 20.1 each 0  . pantoprazole (PROTONIX) 40 MG tablet Take 1 tablet (40 mg total) by mouth daily before breakfast. 30 tablet 2   No current facility-administered medications for this visit.   No Known Allergies   Review of Systems: Gen: Denies any fever, chills, sweats, anorexia, fatigue, weakness, malaise, weight loss, and sleep disorder CV: Denies chest pain, angina, palpitations, syncope, orthopnea, PND, peripheral edema, and claudication. Resp: Denies dyspnea at rest, dyspnea with exercise, cough, sputum, wheezing, coughing up blood, and pleurisy. GI: Denies vomiting blood, jaundice, and fecal incontinence.  Has dysphagia to solids/ GU : Denies urinary burning, blood in urine, urinary frequency, urinary hesitancy, nocturnal urination, and urinary incontinence. MS: Denies joint pain, limitation of movement, and swelling, stiffness, low back pain, extremity pain. Denies muscle weakness, cramps, atrophy.  Derm: Denies rash, itching, dry skin, hives, moles, warts, or unhealing ulcers.  Psych: Denies depression, anxiety, memory loss, suicidal ideation, hallucinations, paranoia, and confusion. Heme: Denies bruising, bleeding, and enlarged lymph nodes. Neuro:  Denies any headaches, dizziness, paresthesias. Endo:  Denies any problems with DM, thyroid, adrenal function   Physical Exam: BP 105/74 mmHg  Pulse 85  Ht  (1.854 m)  Wt 199 lb 6.4 oz (90.447 kg)  BMI 26.31 kg/m2  SpO2 93% Constitutional: Pleasant,well-developed male in no  acute distress. HEENT: Normocephalic and atraumatic. Conjunctivae are normal. No scleral icterus. Neck supple. No thyromegaly or lymphadenopathy Cardiovascular: Normal rate, regular rhythm.  Pulmonary/chest: Effort normal and breath sounds normal. No wheezing, rales or rhonchi. Abdominal: Soft, nondistended, nontender. Bowel sounds active throughout. There are no masses palpable. No hepatomegaly. Extremities: no edema Lymphadenopathy: No cervical adenopathy noted. Neurological: Alert and oriented to person place and time. Skin: Skin is warm and dry. No rashes noted. Psychiatric: Normal mood and affect. Behavior is normal.  ASSESSMENT AND PLAN:  39 year old male with a long-standing history of GERD now with dysphagia referred for evaluation. He has been advised to adhere to an anti-reflux regimen. He will be given a trial of pantoprazole 40 mg 1 by mouth every morning 30 minutes prior to breakfast. He will be scheduled for an EGD to assess for esophagitis, gastritis, ulcers, and dilate any esophageal strictures if indicated.The risks, benefits, and alternatives to endoscopy with possible biopsy and possible dilation were discussed with the patient and they consent to proceed. The procedure will be scheduled with Dr. Christella Hartigan.  Further recommendations will be made pending the findings of the above.   Tiwana Chavis, Tollie Pizza PA-C 09/29/2014, 3:11 PM

## 2014-09-29 NOTE — Progress Notes (Signed)
i agree with the above note, plan 

## 2014-10-06 ENCOUNTER — Encounter: Payer: Self-pay | Admitting: Gastroenterology

## 2014-10-06 ENCOUNTER — Ambulatory Visit (AMBULATORY_SURGERY_CENTER): Payer: BLUE CROSS/BLUE SHIELD | Admitting: Gastroenterology

## 2014-10-06 VITALS — BP 101/57 | HR 75 | Temp 97.0°F | Resp 18 | Ht 73.0 in | Wt 199.0 lb

## 2014-10-06 DIAGNOSIS — K2100 Gastro-esophageal reflux disease with esophagitis, without bleeding: Secondary | ICD-10-CM

## 2014-10-06 DIAGNOSIS — K222 Esophageal obstruction: Secondary | ICD-10-CM

## 2014-10-06 DIAGNOSIS — R1314 Dysphagia, pharyngoesophageal phase: Secondary | ICD-10-CM

## 2014-10-06 DIAGNOSIS — Q394 Esophageal web: Secondary | ICD-10-CM

## 2014-10-06 MED ORDER — SODIUM CHLORIDE 0.9 % IV SOLN: 500.0000 mL | INTRAVENOUS | Status: DC

## 2014-10-06 NOTE — Patient Instructions (Signed)
YOU HAD AN ENDOSCOPIC PROCEDURE TODAY AT THE [facility]: Refer to the procedure report that was given to you for any specific questions about what was found during the examination.  If the procedure report does not answer your questions, please call your gastroenterologist to clarify.  If you requested that your care partner not be given the details of your procedure findings, then the procedure report has been included in a sealed envelope for you to review at your convenience later.  YOU SHOULD EXPECT: Some feelings of bloating in the abdomen. Passage of more gas than usual.  Walking can help get rid of the air that was put into your GI tract during the procedure and reduce the bloating. If you had a lower endoscopy (such as a colonoscopy or flexible sigmoidoscopy) you may notice spotting of blood in your stool or on the toilet paper. If you underwent a bowel prep for your procedure, then you may not have a normal bowel movement for a few days.  DIET: See dilation diet-  Drink plenty of fluids but you should avoid alcoholic beverages for 24 hours.  ACTIVITY: Your care partner should take you home directly after the procedure.  You should plan to take it easy, moving slowly for the rest of the day.  You can resume normal activity the day after the procedure however you should NOT DRIVE or use heavy machinery for 24 hours (because of the sedation medicines used during the test).    SYMPTOMS TO REPORT IMMEDIATELY: A gastroenterologist can be reached at any hour.  During normal business hours, 8:30 AM to 5:00 PM Monday through Friday, call [phone].  After hours and on weekends, please call the GI answering service at [phone] who will take a message and have the physician on call contact you.   Following upper endoscopy (EGD)  Vomiting of blood or coffee ground material  New chest pain or pain under the shoulder blades  Painful or persistently difficult swallowing  New  shortness of breath  Fever of 100F or higher  Black, tarry-looking stools  FOLLOW UP: If any biopsies were taken you will be contacted by phone or by letter within the next 1-3 weeks.  Call your gastroenterologist if you have not heard about the biopsies in 3 weeks.  Our staff will call the home number listed on your records the next business day following your procedure to check on you and address any questions or concerns that you may have at that time regarding the information given to you following your procedure. This is a courtesy call and so if there is no answer at the home number and we have not heard from you through the emergency physician on call, we will assume that you have returned to your regular daily activities without incident.  SIGNATURES/CONFIDENTIALITY: You and/or your care partner have signed paperwork which will be entered into your electronic medical record.  These signatures attest to the fact that that the information above on your After Visit Summary has been reviewed and is understood.  Full responsibility of the confidentiality of this discharge information lies with you and/or your care-partner.  Dilation diet, stricture-handouts given

## 2014-10-06 NOTE — Progress Notes (Signed)
Called to room to assist during endoscopic procedure.  Patient ID and intended procedure confirmed with present staff. Received instructions for my participation in the procedure from the performing physician.  

## 2014-10-06 NOTE — Op Note (Signed)
[facility] [address] [place], [id number]   ENDOSCOPY PROCEDURE REPORT  PATIENT: Alan Brooks, [name]  MR#: [id number] BIRTHDATE: 30-Mar-1976 , 38  yrs. old GENDER: male ENDOSCOPIST: Rachael Feeaniel P Antjuan Rothe, MD REFERRED BY:  Roderick PeeJeffrey A Todd, M.D. PROCEDURE DATE:  10/06/2014 PROCEDURE:  EGD w/ biopsy and EGD w/ balloon dilation ASA CLASS:     Class II INDICATIONS:  Chronic GERD, intermittent solid food dysphagia for years. MEDICATIONS: Monitored anesthesia care and Propofol 400 mg IV TOPICAL ANESTHETIC: none  DESCRIPTION OF PROCEDURE: After the risks benefits and alternatives of the procedure were thoroughly explained, informed consent was obtained.  The LB [id number] endoscope was introduced through the mouth and advanced to the second portion of the duodenum , Without limitations.  The instrument was slowly withdrawn as the mucosa was fully examined.    The esophagus, GE junction appeared normal and I proceeded with biopsies of the esophagus (distal and proximal).  While performing biopsies a mucosal ring at GE junction became evident (Schatzki's ring).  Following biopsies I dilated the GE junction ring with CRE TTS balloon held inflated for 60 seconds.  The examination was otherwise normal.  Retroflexed views revealed no abnormalities. The scope was then withdrawn from the patient and the procedure completed.  COMPLICATIONS: There were no immediate complications.  ENDOSCOPIC IMPRESSION: The esophagus, GE junction appeared normal and I proceeded with biopsies of the esophagus (distal and proximal).  While performing biopsies a mucosal ring at GE junction became evident (Schatzki's ring).  Following biopsies I dilated the GE junction ring with CRE TTS balloon held inflated for 60 seconds.  The examination was otherwise normal  RECOMMENDATIONS: Await final pathology. For now, please start the antiacid medicine (protonix) that was called into your  pharmacy last week. Take one pill 20-30 min prior to BF meal daily.  Chew your food well, eat slowly and take small bites.   eSigned:  Rachael Feeaniel P Treydon Henricks, MD 10/06/2014 8:49 AM

## 2014-10-06 NOTE — Progress Notes (Signed)
A/ox3 pleased with MAC, report to April RN 

## 2014-10-09 ENCOUNTER — Telehealth: Payer: Self-pay | Admitting: *Deleted

## 2014-10-09 NOTE — Telephone Encounter (Signed)
Name identifier, left message, follow-up 

## 2014-10-12 ENCOUNTER — Encounter: Payer: Self-pay | Admitting: Gastroenterology

## 2014-10-26 ENCOUNTER — Encounter: Payer: Self-pay | Admitting: Family Medicine

## 2014-10-26 ENCOUNTER — Ambulatory Visit (INDEPENDENT_AMBULATORY_CARE_PROVIDER_SITE_OTHER): Payer: BLUE CROSS/BLUE SHIELD | Admitting: Family Medicine

## 2014-10-26 VITALS — BP 110/70 | Temp 98.8°F | Wt 198.0 lb

## 2014-10-26 DIAGNOSIS — J4530 Mild persistent asthma, uncomplicated: Secondary | ICD-10-CM

## 2014-10-26 DIAGNOSIS — L719 Rosacea, unspecified: Secondary | ICD-10-CM

## 2014-10-26 DIAGNOSIS — R1310 Dysphagia, unspecified: Secondary | ICD-10-CM

## 2014-10-26 NOTE — Patient Instructions (Signed)
Continue current medications for now  Call Dr. Para SkeansFred Lupton dermatology

## 2014-10-26 NOTE — Progress Notes (Signed)
   Subjective:    Patient ID: Alan Brooks, male    DOB: 01-17-1976, 39 y.o.   MRN: [id number]  HPI Alan Brooks is a 39 year old single male nonsmoker,,,,,, his live-in girlfriend no longer smokes in the car or the house,,,,,, who comes in today for follow-up  We started him on doxycycline 100 mg twice a day and MetroGel at bedtime for rosacea. He says it doesn't seem to be helping. We discussed various treatment options. He appropriately Lex to get a consult with dermatology Dr. Terri PiedraLupton which I support  We send him to see Dr. Christella HartiganJacobs because of difficulty swallowing. He was found to have an esophageal stricture which was dilated. He's now on an anti-reflex program and is able to swallow food again.  Caffeine consumption is 6 cups per day. We discussed caffeine consumption in terms of reflux   Review of Systems Negative except he is cut his Advair down to 1 puff daily his asthma is markedly diminished since he is not around cigarette smoke anymore    Objective:   Physical Exam  Well-developed well-nourished male no acute distress vital signs stable he is afebrile examination skin shows rosacea on the face in the anterior chest wall      Assessment & Plan:  Rosacea unresolved with doxycycline and MetroGel.......Marland Kitchen. refer to dermatology  Reflux esophagitis..... Esophageal stricture......Marland Kitchen. recently dilated..... Follow directions from Dr. Christella HartiganJacobs  Asthma...Marland Kitchen.Marland Kitchen.Marland Kitchen. Advair 1 puff daily albuterol when necessary

## 2014-10-26 NOTE — Progress Notes (Signed)
Pre visit review using our clinic review tool, if applicable. No additional management support is needed unless otherwise documented below in the visit note. 

## 2014-12-13 ENCOUNTER — Ambulatory Visit (INDEPENDENT_AMBULATORY_CARE_PROVIDER_SITE_OTHER): Payer: BLUE CROSS/BLUE SHIELD | Admitting: Family Medicine

## 2014-12-13 VITALS — BP 118/90 | HR 82 | Temp 98.1°F | Resp 17 | Ht 73.25 in | Wt 196.3 lb

## 2014-12-13 DIAGNOSIS — J209 Acute bronchitis, unspecified: Secondary | ICD-10-CM

## 2014-12-13 DIAGNOSIS — B354 Tinea corporis: Secondary | ICD-10-CM

## 2014-12-13 MED ORDER — PREDNISONE 20 MG PO TABS: 40.0000 mg | ORAL_TABLET | Freq: Every day | ORAL | Status: DC

## 2014-12-13 MED ORDER — KETOCONAZOLE 200 MG PO TABS: 200.0000 mg | ORAL_TABLET | Freq: Every day | ORAL | Status: DC

## 2014-12-13 MED ORDER — AZITHROMYCIN 250 MG PO TABS: ORAL_TABLET | ORAL | Status: DC

## 2014-12-13 NOTE — Patient Instructions (Signed)

## 2014-12-13 NOTE — Progress Notes (Signed)
Patient ID: LACHARLES ALTSCHULER MRN: [id number], DOB: [birth year], 39 y.o. Date of Encounter: 12/13/2014, 4:49 PM  Primary Physician: Evette Georges, MD  Chief Complaint:  Chief Complaint  Patient presents with  . Cough    Productive  . Recurrent Skin Infections  . Nasal Congestion  . Chest Congestion    HPI: 39 y.o. year old male presents with a 7-10 day history of nasal congestion, post nasal drip, sore throat, and cough. Mild sinus pressure. Afebrile. No chills. Nasal congestion thick and green/yellow. Cough is productive of green/yellow sputum and not associated with time of day. Ears feel full, leading to sensation of muffled hearing. Has tried OTC cold preps without success. No GI complaints.   No sick contacts, recent antibiotics, or recent travels.   No leg trauma, sedentary periods, h/o cancer, or tobacco use.  Past Medical History  Diagnosis Date  . Anxiety   . Asthma   . Allergy      Home Meds: Prior to Admission medications   Medication Sig Start Date End Date Taking? Authorizing Provider  ADVAIR DISKUS 250-50 MCG/DOSE AEPB INHALE 1 PUFF INTO THE LUNGS 2 (TWO) TIMES DAILY. 07/03/14  Yes Roderick Pee, MD  citalopram (CELEXA) 40 MG tablet Take 1 tablet (40 mg total) by mouth daily. 12/08/13  Yes Roderick Pee, MD  pantoprazole (PROTONIX) 40 MG tablet Take 1 tablet (40 mg total) by mouth daily before breakfast. 09/29/14  Yes Lori P Hvozdovic, PA-C  PROVENTIL HFA 108 (90 BASE) MCG/ACT inhaler USE AS DIRECTED AS NEEDED 04/13/13  Yes Roderick Pee, MD  azithromycin (ZITHROMAX Z-PAK) 250 MG tablet Take as directed on pack 12/13/14   Elvina Sidle, MD  ketoconazole (NIZORAL) 200 MG tablet Take 1 tablet (200 mg total) by mouth daily. 12/13/14   Elvina Sidle, MD  predniSONE (DELTASONE) 20 MG tablet Take 2 tablets (40 mg total) by mouth daily. 12/13/14   Elvina Sidle, MD    Allergies: No Known Allergies  History   Social History  . Marital Status: Single   Spouse Name: N/A  . Number of Children: N/A  . Years of Education: N/A   Occupational History  . Not on file.   Social History Main Topics  . Smoking status: Former Smoker -- 1.00 packs/day for 15 years    Types: Cigarettes  . Smokeless tobacco: Not on file  . Alcohol Use: No  . Drug Use: No  . Sexual Activity: Not on file   Other Topics Concern  . Not on file   Social History Narrative     Review of Systems: Constitutional: negative for chills, fever, night sweats or weight changes Cardiovascular: negative for chest pain or palpitations Respiratory: negative for hemoptysis, wheezing, or shortness of breath Abdominal: negative for abdominal pain, nausea, vomiting or diarrhea Dermatological: Positive for rash-see below Neurologic: negative for headache   Physical Exam: Blood pressure 118/90, pulse 82, temperature 98.1 F (36.7 C), temperature source Oral, resp. rate 17, height 6' 1.25" (1.861 m), weight 196 lb 5.1 oz (89.05 kg), SpO2 97 %., Body mass index is 25.71 kg/(m^2). General: Well developed, well nourished, in no acute distress. Head: Normocephalic, atraumatic, eyes without discharge, sclera non-icteric, nares are congested. Bilateral auditory canals clear, TM's are without perforation, pearly grey with reflective cone of light bilaterally. No sinus TTP. Oral cavity moist, dentition normal. Posterior pharynx with post nasal drip and mild erythema. No peritonsillar abscess or tonsillar exudate. Neck: Supple. No thyromegaly. Full ROM. No lymphadenopathy. Lungs:  Coarse breath sounds bilaterally without wheezes, rales, or rhonchi. Breathing is unlabored.  Heart: RRR with S1 S2. No murmurs, rubs, or gallops appreciated. Msk:  Strength and tone normal for age. Extremities: No clubbing or cyanosis. No edema. Neuro: Alert and oriented X 3. Moves all extremities spontaneously. CNII-XII grossly in tact. Psych:  Responds to questions appropriately with a normal affect.      ASSESSMENT AND PLAN:  39 y.o. year old male with bronchitis. -   ICD-9-CM ICD-10-CM   1. Acute bronchitis, unspecified organism 466.0 J20.9 predniSONE (DELTASONE) 20 MG tablet     azithromycin (ZITHROMAX Z-PAK) 250 MG tablet  2. Tinea corporis 110.5 B35.4 ketoconazole (NIZORAL) 200 MG tablet   Signed, Elvina SidleKurt Joriel Streety, MD 12/13/2014 4:49 PM  Patient also has a rash is been going on over year. This is characterized by red papules on his face diffusely, dandruff, platelike plaque on his left scrotum. He has taken doxycycline and MetroGel for this without any benefit.  Patient also has a annular area on his back that been itching.  Objective: Patient has an interesting seborrheic type rash on his face as well as what appears to be an early psoriatic lesion on the scrotum.  He has a well-circumscribed annular lesion on the interscapular part of his back with central clearing.  Plan: Dermatology consult in a week, Nizoral 200 mg tablets to be taken daily for 7 days along with the prednisone.  Signed, Sheila OatsKurt Mieka Leaton M.D.

## 2015-01-08 ENCOUNTER — Other Ambulatory Visit: Payer: Self-pay | Admitting: Physician Assistant

## 2015-01-08 ENCOUNTER — Other Ambulatory Visit: Payer: Self-pay | Admitting: Family Medicine

## 2015-01-31 ENCOUNTER — Ambulatory Visit (INDEPENDENT_AMBULATORY_CARE_PROVIDER_SITE_OTHER): Payer: BLUE CROSS/BLUE SHIELD | Admitting: Adult Health

## 2015-01-31 ENCOUNTER — Encounter: Payer: Self-pay | Admitting: Adult Health

## 2015-01-31 VITALS — BP 130/84 | HR 84 | Temp 98.3°F | Resp 20 | Ht 73.25 in | Wt 202.0 lb

## 2015-01-31 DIAGNOSIS — Z760 Encounter for issue of repeat prescription: Secondary | ICD-10-CM | POA: Diagnosis not present

## 2015-01-31 DIAGNOSIS — J4540 Moderate persistent asthma, uncomplicated: Secondary | ICD-10-CM

## 2015-01-31 MED ORDER — FLUTICASONE FUROATE-VILANTEROL 200-25 MCG/INH IN AEPB: 200.0000 ug | INHALATION_SPRAY | Freq: Every day | RESPIRATORY_TRACT | Status: DC

## 2015-01-31 NOTE — Progress Notes (Signed)
Pre visit review using our clinic review tool, if applicable. No additional management support is needed unless otherwise documented below in the visit note. 

## 2015-01-31 NOTE — Patient Instructions (Addendum)
Please let me know if this does not work for you.   Go to the ER with any difficulty breathing.    sthma Asthma is a recurring condition in which the airways tighten and narrow. Asthma can make it difficult to breathe. It can cause coughing, wheezing, and shortness of breath. Asthma episodes, also called asthma attacks, range from minor to life-threatening. Asthma cannot be cured, but medicines and lifestyle changes can help control it. CAUSES Asthma is believed to be caused by inherited (genetic) and environmental factors, but its exact cause is unknown. Asthma may be triggered by allergens, lung infections, or irritants in the air. Asthma triggers are different for each person. Common triggers include:   Animal dander.  Dust mites.  Cockroaches.  Pollen from trees or grass.  Mold.  Smoke.  Air pollutants such as dust, household cleaners, hair sprays, aerosol sprays, paint fumes, strong chemicals, or strong odors.  Cold air, weather changes, and winds (which increase molds and pollens in the air).  Strong emotional expressions such as crying or laughing hard.  Stress.  Certain medicines (such as aspirin) or types of drugs (such as beta-blockers).  Sulfites in foods and drinks. Foods and drinks that may contain sulfites include dried fruit, potato chips, and sparkling grape juice.  Infections or inflammatory conditions such as the flu, a cold, or an inflammation of the nasal membranes (rhinitis).  Gastroesophageal reflux disease (GERD).  Exercise or strenuous activity. SYMPTOMS Symptoms may occur immediately after asthma is triggered or many hours later. Symptoms include:  Wheezing.  Excessive nighttime or early morning coughing.  Frequent or severe coughing with a common cold.  Chest tightness.  Shortness of breath. DIAGNOSIS  The diagnosis of asthma is made by a review of your medical history and a physical exam. Tests may also be performed. These may  include:  Lung function studies. These tests show how much air you breathe in and out.  Allergy tests.  Imaging tests such as X-rays. TREATMENT  Asthma cannot be cured, but it can usually be controlled. Treatment involves identifying and avoiding your asthma triggers. It also involves medicines. There are 2 classes of medicine used for asthma treatment:   Controller medicines. These prevent asthma symptoms from occurring. They are usually taken every day.  Reliever or rescue medicines. These quickly relieve asthma symptoms. They are used as needed and provide short-term relief. Your health care provider will help you create an asthma action plan. An asthma action plan is a written plan for managing and treating your asthma attacks. It includes a list of your asthma triggers and how they may be avoided. It also includes information on when medicines should be taken and when their dosage should be changed. An action plan may also involve the use of a device called a peak flow meter. A peak flow meter measures how well the lungs are working. It helps you monitor your condition. HOME CARE INSTRUCTIONS   Take medicines only as directed by your health care provider. Speak with your health care provider if you have questions about how or when to take the medicines.  Use a peak flow meter as directed by your health care provider. Record and keep track of readings.  Understand and use the action plan to help minimize or stop an asthma attack without needing to seek medical care.  Control your home environment in the following ways to help prevent asthma attacks:  Do not smoke. Avoid being exposed to secondhand smoke.  Change your  heating and air conditioning filter regularly.  Limit your use of fireplaces and wood stoves.  Get rid of pests (such as roaches and mice) and their droppings.  Throw away plants if you see mold on them.  Clean your floors and dust regularly. Use unscented cleaning  products.  Try to have someone else vacuum for you regularly. Stay out of rooms while they are being vacuumed and for a short while afterward. If you vacuum, use a dust mask from a hardware store, a double-layered or microfilter vacuum cleaner bag, or a vacuum cleaner with a HEPA filter.  Replace carpet with wood, tile, or vinyl flooring. Carpet can trap dander and dust.  Use allergy-proof pillows, mattress covers, and box spring covers.  Wash bed sheets and blankets every week in hot water and dry them in a dryer.  Use blankets that are made of polyester or cotton.  Clean bathrooms and kitchens with bleach. If possible, have someone repaint the walls in these rooms with mold-resistant paint. Keep out of the rooms that are being cleaned and painted.  Wash hands frequently. SEEK MEDICAL CARE IF:   You have wheezing, shortness of breath, or a cough even if taking medicine to prevent attacks.  The colored mucus you cough up (sputum) is thicker than usual.  Your sputum changes from clear or white to yellow, green, gray, or bloody.  You have any problems that may be related to the medicines you are taking (such as a rash, itching, swelling, or trouble breathing).  You are using a reliever medicine more than 2-3 times per week.  Your peak flow is still at 50-79% of your personal best after following your action plan for 1 hour.  You have a fever. SEEK IMMEDIATE MEDICAL CARE IF:   You seem to be getting worse and are unresponsive to treatment during an asthma attack.  You are short of breath even at rest.  You get short of breath when doing very little physical activity.  You have difficulty eating, drinking, or talking due to asthma symptoms.  You develop chest pain.  You develop a fast heartbeat.  You have a bluish color to your lips or fingernails.  You are light-headed, dizzy, or faint.  Your peak flow is less than 50% of your personal best. MAKE SURE YOU:   Understand  these instructions.  Will watch your condition.  Will get help right away if you are not doing well or get worse. Document Released: 08/11/2005 Document Revised: 12/26/2013 Document Reviewed: 03/10/2013 [facility] Patient Information 2015 [employer] [place]. This information is not intended to replace advice given to you by your health care provider. Make sure you discuss any questions you have with your health care provider.

## 2015-01-31 NOTE — Progress Notes (Signed)
Subjective:    Patient ID: Alan Brooks, male    DOB: Jul 20, 1976, 39 y.o.   MRN: [id number]  HPI  Has a history of asthma and was using Adviar while he smoked. He stopped smoking two years ago at which time he was having to use two puffs of his advair. He went down to one puff a day over the last year. Two weeks ago he started doing one puff every other day. This week he has not been using his Advair( because he is out of refills). Has been using his rescue inhaler 5-6 times a day.    Review of Systems  Constitutional: Negative.   HENT: Negative.   Eyes: Negative.   Respiratory: Positive for shortness of breath and wheezing. Negative for apnea, cough, choking, chest tightness and stridor.   Cardiovascular: Negative.   Neurological: Negative.   All other systems reviewed and are negative.  Past Medical History  Diagnosis Date  . Anxiety   . Asthma   . Allergy     History   Social History  . Marital Status: Single    Spouse Name: N/A  . Number of Children: N/A  . Years of Education: N/A   Occupational History  . Not on file.   Social History Main Topics  . Smoking status: Former Smoker -- 1.00 packs/day for 15 years    Types: Cigarettes  . Smokeless tobacco: Not on file  . Alcohol Use: No  . Drug Use: No  . Sexual Activity: Not on file   Other Topics Concern  . Not on file   Social History Narrative    No past surgical history on file.  Family History  Problem Relation Age of Onset  . Asthma Other   . Depression Other   . Anxiety disorder Other     No Known Allergies  Current Outpatient Prescriptions on File Prior to Visit  Medication Sig Dispense Refill  . citalopram (CELEXA) 40 MG tablet TAKE 1 TABLET BY MOUTH ONCE DAILY 90 tablet 0  . pantoprazole (PROTONIX) 40 MG tablet TAKE 1 TABLET BY MOUTH ONCE DAILY BEFORE BREAKFAST 30 tablet 2  . PROVENTIL HFA 108 (90 BASE) MCG/ACT inhaler USE AS DIRECTED AS NEEDED 20.1 each 0   No current  facility-administered medications on file prior to visit.    BP 130/84 mmHg  Pulse 84  Temp(Src) 98.3 F (36.8 C) (Oral)  Resp 20  Ht 6' 1.25" (1.861 m)  Wt 202 lb (91.627 kg)  BMI 26.46 kg/m2  SpO2 97%        Objective:   Physical Exam  Constitutional: He is oriented to person, place, and time. He appears well-developed and well-nourished. No distress.  Cardiovascular: Normal rate, regular rhythm, normal heart sounds and intact distal pulses.  Exam reveals no gallop and no friction rub.   No murmur heard. Pulmonary/Chest: Effort normal. No respiratory distress. He has wheezes (right upper). He has no rales. He exhibits no tenderness.  Does not appear in any acute respiratory distress  Neurological: He is alert and oriented to person, place, and time.  Skin: Skin is warm and dry. He is not diaphoretic.  Psychiatric: He has a normal mood and affect. His behavior is normal. Thought content normal.  Nursing note and vitals reviewed.      Assessment & Plan:  1. Medication refill - Fluticasone Furoate-Vilanterol (BREO ELLIPTA) 200-25 MCG/INH AEPB; Inhale 200 mcg into the lungs daily.  Dispense: 1 each; Refill: 11  2. Asthma,  chronic, moderate persistent, uncomplicated - Discontinue Advair - Fluticasone Furoate-Vilanterol (BREO ELLIPTA) 200-25 MCG/INH AEPB; Inhale 200 mcg into the lungs daily.  Dispense: 1 each; Refill: 11 - Follow up with me if no improvement

## 2015-04-03 ENCOUNTER — Other Ambulatory Visit: Payer: Self-pay | Admitting: Gastroenterology

## 2015-04-06 ENCOUNTER — Telehealth: Payer: Self-pay | Admitting: Family Medicine

## 2015-04-06 MED ORDER — CITALOPRAM HYDROBROMIDE 40 MG PO TABS: 40.0000 mg | ORAL_TABLET | Freq: Every day | ORAL | Status: DC

## 2015-04-06 NOTE — Telephone Encounter (Signed)
Refill request for Citalopram 40 mg take 1 po qd and a 90 day supply to [facility].

## 2015-06-11 ENCOUNTER — Ambulatory Visit (INDEPENDENT_AMBULATORY_CARE_PROVIDER_SITE_OTHER): Payer: BLUE CROSS/BLUE SHIELD | Admitting: Emergency Medicine

## 2015-06-11 ENCOUNTER — Ambulatory Visit (INDEPENDENT_AMBULATORY_CARE_PROVIDER_SITE_OTHER): Payer: BLUE CROSS/BLUE SHIELD

## 2015-06-11 VITALS — BP 124/82 | HR 84 | Temp 98.2°F | Resp 16 | Ht 73.25 in | Wt 207.8 lb

## 2015-06-11 DIAGNOSIS — M79676 Pain in unspecified toe(s): Secondary | ICD-10-CM

## 2015-06-11 DIAGNOSIS — M79609 Pain in unspecified limb: Secondary | ICD-10-CM

## 2015-06-11 DIAGNOSIS — S92501A Displaced unspecified fracture of right lesser toe(s), initial encounter for closed fracture: Secondary | ICD-10-CM | POA: Diagnosis not present

## 2015-06-11 MED ORDER — HYDROCODONE-ACETAMINOPHEN 5-325 MG PO TABS: 1.0000 | ORAL_TABLET | ORAL | Status: DC | PRN

## 2015-06-11 NOTE — Patient Instructions (Signed)
Toe Fracture °A toe fracture is a break in one of the toe bones (phalanges). °CAUSES °This condition may be caused by: °· Dropping a heavy object on your toe. °· Stubbing your toe. °· Overusing your toe or doing repetitive exercise. °· Twisting or stretching your toe out of place. °RISK FACTORS °This condition is more likely to develop in people who: °· Play contact sports. °· Have a bone disease. °· Have a low calcium level. °SYMPTOMS °The main symptoms of this condition are swelling and pain in the toe. The pain may get worse with standing or walking. Other symptoms include: °· Bruising. °· Stiffness. °· Numbness. °· A change in the way the toe looks. °· Broken bones that poke through the skin. °· Blood beneath the toenail. °DIAGNOSIS °This condition is diagnosed with a physical exam. You may also have X-rays. °TREATMENT  °Treatment for this condition depends on the type of fracture and its severity. Treatment may involve: °· Taping the broken toe to a toe that is next to it (buddy taping). This is the most common treatment for fractures in which the bone has not moved out of place (nondisplaced fracture). °· Wearing a shoe that has a wide, rigid sole to protect the toe and to limit its movement. °· Wearing a walking cast. °· Having a procedure to move the toe back into place. °· Surgery. This may be needed: °¨ If there are many pieces of broken bone that are out of place (displaced). °¨ If the toe joint breaks. °¨ If the bone breaks through the skin. °· Physical therapy. This is done to help regain movement and strength in the toe. °You may need follow-up X-rays to make sure that the bone is healing well and staying in position. °HOME CARE INSTRUCTIONS °If You Have a Cast: °· Do not stick anything inside the cast to scratch your skin. Doing that increases your risk of infection. °· Check the skin around the cast every day. Report any concerns to your health care provider. You may put lotion on dry skin around the  edges of the cast. Do not apply lotion to the skin underneath the cast. °· Do not put pressure on any part of the cast until it is fully hardened. This may take several hours. °· Keep the cast clean and dry. °Bathing °· Do not take baths, swim, or use a hot tub until your health care provider approves. Ask your health care provider if you can take showers. You may only be allowed to take sponge baths for bathing. °· If your health care provider approves bathing and showering, cover the cast or bandage (dressing) with a watertight plastic bag to protect it from water. Do not let the cast or dressing get wet. °Managing Pain, Stiffness, and Swelling °· If you do not have a cast, apply ice to the injured area, if directed. °¨ Put ice in a plastic bag. °¨ Place a towel between your skin and the bag. °¨ Leave the ice on for 20 minutes, 2-3 times per day. °· Move your toes often to avoid stiffness and to lessen swelling. °· Raise (elevate) the injured area above the level of your heart while you are sitting or lying down. °Driving °· Do not drive or operate heavy machinery while taking pain medicine. °· Do not drive while wearing a cast on a foot that you use for driving. °Activity °· Return to your normal activities as directed by your health care provider. Ask your health care   provider what activities are safe for you. °· Perform exercises daily as directed by your health care provider or physical therapist. °Safety °· Do not use the injured limb to support your body weight until your health care provider says that you can. Use crutches or other assistive devices as directed by your health care provider. °General Instructions °· If your toe was treated with buddy taping, follow your health care provider's instructions for changing the gauze and tape. Change it more often: °¨ The gauze and tape get wet. If this happens, dry the space between the toes. °¨ The gauze and tape are too tight and cause your toe to become pale  or numb. °· Wear a protective shoe as directed by your health care provider. If you were not given a protective shoe, wear sturdy, supportive shoes. Your shoes should not pinch your toes and should not fit tightly against your toes. °· Do not use any tobacco products, including cigarettes, chewing tobacco, or e-cigarettes. Tobacco can delay bone healing. If you need help quitting, ask your health care provider. °· Take medicines only as directed by your health care provider. °· Keep all follow-up visits as directed by your health care provider. This is important. °SEEK MEDICAL CARE IF: °· You have a fever. °· Your pain medicine is not helping. °· Your toe is cold. °· Your toe is numb. °· You still have pain after one week of rest and treatment. °· You still have pain after your health care provider has said that you can start walking again. °· You have pain, tingling, or numbness in your foot that is not going away. °SEEK IMMEDIATE MEDICAL CARE IF: °· You have severe pain. °· You have redness or inflammation in your toe that is getting worse. °· You have pain or numbness in your toe that is getting worse. °· Your toe turns blue. °  °This information is not intended to replace advice given to you by your health care provider. Make sure you discuss any questions you have with your health care provider. °  °Document Released: 08/08/2000 Document Revised: 05/02/2015 Document Reviewed: 06/07/2014 °Elsevier Interactive Patient Education ©2016 [employer] ° °

## 2015-06-11 NOTE — Progress Notes (Signed)
Subjective:  Patient ID: Alan Brooks, male    DOB: May 10, 1976  Age: 39 y.o. MRN: [id number]  CC: Foot Injury   HPI Alan Brooks presents  he was chasing his dog around the living room last night and kicked the leg about, a couch with his foot and has pain in the small toe. There is no deformity but is rather swollen and ecchymotic. He has pain with ambulation  History Alan Brooks has a past medical history of Anxiety; Asthma; and Allergy.   He has no past surgical history on file.   His  family history includes Anxiety disorder in his other; Asthma in his other; Depression in his other.  He   reports that he has quit smoking. His smoking use included Cigarettes. He has a 15 pack-year smoking history. He does not have any smokeless tobacco history on file. He reports that he does not drink alcohol or use illicit drugs.  Outpatient Prescriptions Prior to Visit  Medication Sig Dispense Refill  . citalopram (CELEXA) 40 MG tablet Take 1 tablet (40 mg total) by mouth daily. 90 tablet 0  . Fluticasone Furoate-Vilanterol (BREO ELLIPTA) 200-25 MCG/INH AEPB Inhale 200 mcg into the lungs daily. 1 each 11  . pantoprazole (PROTONIX) 40 MG tablet TAKE 1 TABLET BY MOUTH ONCE DAILY BEFORE BREAKFAST 30 tablet 2  . PROVENTIL HFA 108 (90 BASE) MCG/ACT inhaler USE AS DIRECTED AS NEEDED 20.1 each 0   No facility-administered medications prior to visit.    Social History   Social History  . Marital Status: Single    Spouse Name: N/A  . Number of Children: N/A  . Years of Education: N/A   Social History Main Topics  . Smoking status: Former Smoker -- 1.00 packs/day for 15 years    Types: Cigarettes  . Smokeless tobacco: None  . Alcohol Use: No  . Drug Use: No  . Sexual Activity: Not Asked   Other Topics Concern  . None   Social History Narrative     Review of Systems  Constitutional: Negative for fever, chills and appetite change.  HENT: Negative for congestion, ear pain,  postnasal drip, sinus pressure and sore throat.   Eyes: Negative for pain and redness.  Respiratory: Negative for cough, shortness of breath and wheezing.   Cardiovascular: Negative for leg swelling.  Gastrointestinal: Negative for nausea, vomiting, abdominal pain, diarrhea, constipation and blood in stool.  Endocrine: Negative for polyuria.  Genitourinary: Negative for dysuria, urgency, frequency and flank pain.  Musculoskeletal: Negative for gait problem.  Skin: Negative for rash.  Neurological: Negative for weakness and headaches.  Psychiatric/Behavioral: Negative for confusion and decreased concentration. The patient is not nervous/anxious.     Objective:  BP 124/82 mmHg  Pulse 84  Temp(Src) 98.2 F (36.8 C) (Oral)  Resp 16  Ht 6' 1.25" (1.861 m)  Wt 207 lb 12.8 oz (94.257 kg)  BMI 27.22 kg/m2  SpO2 98%  Physical Exam  Constitutional: He is oriented to person, place, and time. He appears well-developed and well-nourished.  HENT:  Head: Normocephalic and atraumatic.  Eyes: Conjunctivae are normal. Pupils are equal, round, and reactive to light.  Pulmonary/Chest: Effort normal.  Musculoskeletal: He exhibits no edema.       Right foot: There is tenderness and swelling. There is no deformity.  Small toe  Neurological: He is alert and oriented to person, place, and time.  Skin: Skin is dry.  Psychiatric: He has a normal mood and affect. His behavior is  normal. Thought content normal.      Assessment & Plan:   Alan Brooks was seen today for foot injury.  Diagnoses and all orders for this visit:  Pain of fifth toe -     DG Toe 5th Right; Future  Closed fracture of fifth toe of right foot, initial encounter -     Ambulatory referral to Orthopedic Surgery  Other orders -     HYDROcodone-acetaminophen (NORCO) 5-325 MG tablet; Take 1-2 tablets by mouth every 4 (four) hours as needed.   I am having Alan Brooks start on HYDROcodone-acetaminophen. I am also having him  maintain his PROVENTIL HFA, Fluticasone Furoate-Vilanterol, pantoprazole, and citalopram.  Meds ordered this encounter  Medications  . HYDROcodone-acetaminophen (NORCO) 5-325 MG tablet    Sig: Take 1-2 tablets by mouth every 4 (four) hours as needed.    Dispense:  30 tablet    Refill:  0    Appropriate red flag conditions were discussed with the patient as well as actions that should be taken.  Patient expressed his understanding.  Follow-up: Return if symptoms worsen or fail to improve.  Carmelina DaneAnderson, Kehinde Totzke S, MD   [facility] reading (PRIMARY) by  Dr. Dareen PianoAnderson fracture fifth toe.

## 2015-07-05 ENCOUNTER — Other Ambulatory Visit: Payer: Self-pay

## 2015-07-05 MED ORDER — PANTOPRAZOLE SODIUM 40 MG PO TBEC: DELAYED_RELEASE_TABLET | ORAL | Status: DC

## 2015-07-05 NOTE — Telephone Encounter (Signed)
[facility] pantoprazole 40 mg 1 qd #30 3  Refills. RX approved for 3 months supply.

## 2015-07-11 ENCOUNTER — Telehealth: Payer: Self-pay | Admitting: Family Medicine

## 2015-07-11 ENCOUNTER — Other Ambulatory Visit: Payer: Self-pay | Admitting: *Deleted

## 2015-07-11 DIAGNOSIS — Z760 Encounter for issue of repeat prescription: Secondary | ICD-10-CM

## 2015-07-11 DIAGNOSIS — J4540 Moderate persistent asthma, uncomplicated: Secondary | ICD-10-CM

## 2015-07-11 MED ORDER — CITALOPRAM HYDROBROMIDE 40 MG PO TABS: 40.0000 mg | ORAL_TABLET | Freq: Every day | ORAL | Status: DC

## 2015-07-11 NOTE — Telephone Encounter (Signed)
Pt would like a refill on his Citalopram and his Breio sent to [facility] please. He is out.

## 2015-07-12 ENCOUNTER — Encounter: Payer: Self-pay | Admitting: Adult Health

## 2015-07-12 ENCOUNTER — Ambulatory Visit (INDEPENDENT_AMBULATORY_CARE_PROVIDER_SITE_OTHER): Payer: BLUE CROSS/BLUE SHIELD | Admitting: Adult Health

## 2015-07-12 VITALS — BP 102/78 | Temp 98.6°F | Ht 73.0 in | Wt 210.8 lb

## 2015-07-12 DIAGNOSIS — Z760 Encounter for issue of repeat prescription: Secondary | ICD-10-CM

## 2015-07-12 DIAGNOSIS — J4540 Moderate persistent asthma, uncomplicated: Secondary | ICD-10-CM | POA: Diagnosis not present

## 2015-07-12 DIAGNOSIS — Z0000 Encounter for general adult medical examination without abnormal findings: Secondary | ICD-10-CM | POA: Diagnosis not present

## 2015-07-12 MED ORDER — CITALOPRAM HYDROBROMIDE 40 MG PO TABS: 40.0000 mg | ORAL_TABLET | Freq: Every day | ORAL | Status: DC

## 2015-07-12 MED ORDER — FLUTICASONE FUROATE-VILANTEROL 200-25 MCG/INH IN AEPB: 200.0000 ug | INHALATION_SPRAY | Freq: Every day | RESPIRATORY_TRACT | Status: DC

## 2015-07-12 NOTE — Telephone Encounter (Signed)
Prescriptions refilled.

## 2015-07-12 NOTE — Telephone Encounter (Signed)
Pt has an appt today with Cory. 

## 2015-07-12 NOTE — Addendum Note (Signed)
Addended by: Azucena FreedMILLNER, Jamarius Saha C on: 07/12/2015 10:50 AM   Modules accepted: Orders

## 2015-07-12 NOTE — Patient Instructions (Addendum)
It was great seeing you again today!  Follow up for blood work  I cannot stress enough how important it is to start eating healthy and exercising. Please add fruits, vegetables and lean meats to your diet as well as exercise for at least 30 minutes , 3-4 times a week.   Health Maintenance, Male A healthy lifestyle and preventative care can promote health and wellness.  Maintain regular health, dental, and eye exams.  Eat a healthy diet. Foods like vegetables, fruits, whole grains, low-fat dairy products, and lean protein foods contain the nutrients you need and are low in calories. Decrease your intake of foods high in solid fats, added sugars, and salt. Get information about a proper diet from your health care provider, if necessary.  Regular physical exercise is one of the most important things you can do for your health. Most adults should get at least 150 minutes of moderate-intensity exercise (any activity that increases your heart rate and causes you to sweat) each week. In addition, most adults need muscle-strengthening exercises on 2 or more days a week.   Maintain a healthy weight. The body mass index (BMI) is a screening tool to identify possible weight problems. It provides an estimate of body fat based on height and weight. Your health care provider can find your BMI and can help you achieve or maintain a healthy weight. For males 20 years and older:  A BMI below 18.5 is considered underweight.  A BMI of 18.5 to 24.9 is normal.  A BMI of 25 to 29.9 is considered overweight.  A BMI of 30 and above is considered obese.  Maintain normal blood lipids and cholesterol by exercising and minimizing your intake of saturated fat. Eat a balanced diet with plenty of fruits and vegetables. Blood tests for lipids and cholesterol should begin at age 37 and be repeated every 5 years. If your lipid or cholesterol levels are high, you are over age 37, or you are at high risk for heart disease,  you may need your cholesterol levels checked more frequently.Ongoing high lipid and cholesterol levels should be treated with medicines if diet and exercise are not working.  If you smoke, find out from your health care provider how to quit. If you do not use tobacco, do not start.  Lung cancer screening is recommended for adults aged 55-80 years who are at high risk for developing lung cancer because of a history of smoking. A yearly low-dose CT scan of the lungs is recommended for people who have at least a 30-pack-year history of smoking and are current smokers or have quit within the past 15 years. A pack year of smoking is smoking an average of 1 pack of cigarettes a day for 1 year (for example, a 30-pack-year history of smoking could mean smoking 1 pack a day for 30 years or 2 packs a day for 15 years). Yearly screening should continue until the smoker has stopped smoking for at least 15 years. Yearly screening should be stopped for people who develop a health problem that would prevent them from having lung cancer treatment.  If you choose to drink alcohol, do not have more than 2 drinks per day. One drink is considered to be 12 oz (360 mL) of beer, 5 oz (150 mL) of wine, or 1.5 oz (45 mL) of liquor.  Avoid the use of street drugs. Do not share needles with anyone. Ask for help if you need support or instructions about stopping the use  of drugs.  High blood pressure causes heart disease and increases the risk of stroke. High blood pressure is more likely to develop in:  People who have blood pressure in the end of the normal range (100-139/85-89 mm Hg).  People who are overweight or obese.  People who are African American.  If you are 70-59 years of age, have your blood pressure checked every 3-5 years. If you are 19 years of age or older, have your blood pressure checked every year. You should have your blood pressure measured twice--once when you are at a hospital or clinic, and once when  you are not at a hospital or clinic. Record the average of the two measurements. To check your blood pressure when you are not at a hospital or clinic, you can use:  An automated blood pressure machine at a pharmacy.  A home blood pressure monitor.  If you are 8-43 years old, ask your health care provider if you should take aspirin to prevent heart disease.  Diabetes screening involves taking a blood sample to check your fasting blood sugar level. This should be done once every 3 years after age 26 if you are at a normal weight and without risk factors for diabetes. Testing should be considered at a younger age or be carried out more frequently if you are overweight and have at least 1 risk factor for diabetes.  Colorectal cancer can be detected and often prevented. Most routine colorectal cancer screening begins at the age of 32 and continues through age 45. However, your health care provider may recommend screening at an earlier age if you have risk factors for colon cancer. On a yearly basis, your health care provider may provide home test kits to check for hidden blood in the stool. A small camera at the end of a tube may be used to directly examine the colon (sigmoidoscopy or colonoscopy) to detect the earliest forms of colorectal cancer. Talk to your health care provider about this at age 27 when routine screening begins. A direct exam of the colon should be repeated every 5-10 years through age 33, unless early forms of precancerous polyps or small growths are found.  People who are at an increased risk for hepatitis B should be screened for this virus. You are considered at high risk for hepatitis B if:  You were born in a country where hepatitis B occurs often. Talk with your health care provider about which countries are considered high risk.  Your parents were born in a high-risk country and you have not received a shot to protect against hepatitis B (hepatitis B vaccine).  You have HIV  or AIDS.  You use needles to inject street drugs.  You live with, or have sex with, someone who has hepatitis B.  You are a man who has sex with other men (MSM).  You get hemodialysis treatment.  You take certain medicines for conditions like cancer, organ transplantation, and autoimmune conditions.  Hepatitis C blood testing is recommended for all people born from [birth year] through 1963 and any individual with known risk factors for hepatitis C.  Healthy men should no longer receive prostate-specific antigen (PSA) blood tests as part of routine cancer screening. Talk to your health care provider about prostate cancer screening.  Testicular cancer screening is not recommended for adolescents or adult males who have no symptoms. Screening includes self-exam, a health care provider exam, and other screening tests. Consult with your health care provider about any symptoms you  have or any concerns you have about testicular cancer.  Practice safe sex. Use condoms and avoid high-risk sexual practices to reduce the spread of sexually transmitted infections (STIs).  You should be screened for STIs, including gonorrhea and chlamydia if:  You are sexually active and are younger than 24 years.  You are older than 24 years, and your health care provider tells you that you are at risk for this type of infection.  Your sexual activity has changed since you were last screened, and you are at an increased risk for chlamydia or gonorrhea. Ask your health care provider if you are at risk.  If you are at risk of being infected with HIV, it is recommended that you take a prescription medicine daily to prevent HIV infection. This is called pre-exposure prophylaxis (PrEP). You are considered at risk if:  You are a man who has sex with other men (MSM).  You are a heterosexual man who is sexually active with multiple partners.  You take drugs by injection.  You are sexually active with a partner who has  HIV.  Talk with your health care provider about whether you are at high risk of being infected with HIV. If you choose to begin PrEP, you should first be tested for HIV. You should then be tested every 3 months for as long as you are taking PrEP.  Use sunscreen. Apply sunscreen liberally and repeatedly throughout the day. You should seek shade when your shadow is shorter than you. Protect yourself by wearing long sleeves, pants, a wide-brimmed hat, and sunglasses year round whenever you are outdoors.  Tell your health care provider of new moles or changes in moles, especially if there is a change in shape or color. Also, tell your health care provider if a mole is larger than the size of a pencil eraser.  A one-time screening for abdominal aortic aneurysm (AAA) and surgical repair of large AAAs by ultrasound is recommended for men aged 85-75 years who are current or former smokers.  Stay current with your vaccines (immunizations).   This information is not intended to replace advice given to you by your health care provider. Make sure you discuss any questions you have with your health care provider.   Document Released: 02/07/2008 Document Revised: 09/01/2014 Document Reviewed: 01/06/2011 Elsevier Interactive Patient Education [employer].

## 2015-07-12 NOTE — Progress Notes (Signed)
HPI:  Alan Brooks is here to establish care and have a complete physical.  Last PCP and physical: 2013  Has the following chronic problems that require follow up and concerns today:  Anxiety - well controlled with Celexa  Asthma, - Feels like Virgel BouquetBreo Ellipta works much better than [place]pir[place] for him.   ROS negative for unless reported above: fevers, chills,feeling poorly, unintentional weight loss, hearing or vision loss, chest pain, palpitations, leg claudication, struggling to breath,Not feeling congested in the chest, no orthopenia, no cough,no wheezing, normal appetite, no soft tissue swelling, no hemoptysis, melena, hematochezia, hematuria, falls, loc, si, or thoughts of self harm.   Immunizations:Needs tdap and flu but does not want Diet: Does not eat healthy Exercise: Does not exercise    Past Medical History  Diagnosis Date  . Anxiety   . Asthma   . Allergy     No past surgical history on file.  Family History  Problem Relation Age of Onset  . Asthma Other   . Depression Other   . Anxiety disorder Other     Social History   Social History  . Marital Status: Single    Spouse Name: N/A  . Number of Children: N/A  . Years of Education: N/A   Social History Main Topics  . Smoking status: Former Smoker -- 1.00 packs/day for 15 years    Types: Cigarettes  . Smokeless tobacco: None  . Alcohol Use: No  . Drug Use: No  . Sexual Activity: Not Asked   Other Topics Concern  . None   Social History Narrative     Current outpatient prescriptions:  .  citalopram (CELEXA) 40 MG tablet, Take 1 tablet (40 mg total) by mouth daily., Disp: 90 tablet, Rfl: 0 .  Fluticasone Furoate-Vilanterol (BREO ELLIPTA) 200-25 MCG/INH AEPB, Inhale 200 mcg into the lungs daily., Disp: 1 each, Rfl: 11 .  pantoprazole (PROTONIX) 40 MG tablet, TAKE 1 TABLET BY MOUTH ONCE DAILY BEFORE BREAKFAST, Disp: 30 tablet, Rfl: 2 .  PROVENTIL HFA 108 (90 BASE) MCG/ACT inhaler, USE AS  DIRECTED AS NEEDED, Disp: 20.1 each, Rfl: 0  EXAM:  Filed Vitals:   07/12/15 0830  BP: 102/78  Temp: 98.6 F (37 C)    Body mass index is 27.82 kg/(m^2).  GENERAL: vitals reviewed and listed above, alert, oriented, appears well hydrated and in no acute distress  HEENT: atraumatic, conjunttiva clear, no obvious abnormalities on inspection of external nose and ears. Wears glasses  NECK: Neck is soft and supple without masses, no adenopathy or thyromegaly, trachea midline, no JVD. Normal range of motion.   LUNGS: clear to auscultation bilaterally, no wheezes, rales or rhonchi, good air movement  CV: Regular rate and rhythm, normal S1/S2, no audible murmurs, gallops, or rubs. No peripheral edema.   MS: moves all extremities without noticeable abnormality. No edema noted  Abd: soft/nontender/nondistended/normal bowel sounds   Skin: warm and dry, no rash   Extremities: No clubbing, cyanosis, or edema. Capillary refill is WNL. Pulses intact bilaterally in upper and lower extremities.   Neuro: CN II-XII intact, sensation and reflexes normal throughout, 5/5 muscle strength in bilateral upper and lower extremities. Normal finger to nose. Normal rapid alternating movements. Normal romberg. No pronator drift.   PSYCH: pleasant and cooperative, no obvious depression or anxiety  ASSESSMENT AND PLAN:  1. Routine general medical examination at a health care facility  - Basic metabolic panel; Future - CBC; Future - Hemoglobin A1c; Future - Hepatic function panel;  Future - Lipid panel; Future - POCT urinalysis dipstick; Future - TSH; Future - Stressed importance of diet and exercise.  - Follow up for labs and then follow up in one year for CPE - Follow up sooner if needed  Discussed the following assessment and plan:  -We reviewed the PMH, PSH, FH, SH, Meds and Allergies. -We provided refills for any medications we will prescribe as needed. -We addressed current concerns per  orders and patient instructions. -We have asked for records for pertinent exams, studies, vaccines and notes from previous providers. -We have advised patient to follow up per instructions below.   -Patient advised to return or notify a provider immediately if symptoms worsen or persist or new concerns arise.   Shirline Frees, AGNP

## 2015-07-13 ENCOUNTER — Other Ambulatory Visit (INDEPENDENT_AMBULATORY_CARE_PROVIDER_SITE_OTHER): Payer: BLUE CROSS/BLUE SHIELD

## 2015-07-13 DIAGNOSIS — Z0000 Encounter for general adult medical examination without abnormal findings: Secondary | ICD-10-CM | POA: Diagnosis not present

## 2015-07-13 DIAGNOSIS — Z23 Encounter for immunization: Secondary | ICD-10-CM | POA: Diagnosis not present

## 2015-07-13 LAB — LIPID PANEL
Cholesterol: 195 mg/dL (ref 0–200)
HDL: 38.8 mg/dL — ABNORMAL LOW (ref >39.00)
LDL Cholesterol: 139 mg/dL — ABNORMAL HIGH (ref 0–99)
NonHDL: 156.58
Total CHOL/HDL Ratio: 5
Triglycerides: 90 mg/dL (ref 0.0–149.0)
VLDL: 18 mg/dL (ref 0.0–40.0)

## 2015-07-13 LAB — CBC
HCT: 48.2 % (ref 39.0–52.0)
Hemoglobin: 16 g/dL (ref 13.0–17.0)
MCHC: 33.2 g/dL (ref 30.0–36.0)
MCV: 92.7 fl (ref 78.0–100.0)
Platelets: 221 10*3/uL (ref 150.0–400.0)
RBC: 5.2 Mil/uL (ref 4.22–5.81)
RDW: 13.5 % (ref 11.5–15.5)
WBC: 5.2 10*3/uL (ref 4.0–10.5)

## 2015-07-13 LAB — BASIC METABOLIC PANEL
BUN: 15 mg/dL (ref 6–23)
CO2: 31 mEq/L (ref 19–32)
Calcium: 9.6 mg/dL (ref 8.4–10.5)
Chloride: 105 mEq/L (ref 96–112)
Creatinine, Ser: 1.18 mg/dL (ref 0.40–1.50)
GFR: 73.62 mL/min (ref >60.00)
Glucose, Bld: 102 mg/dL — ABNORMAL HIGH (ref 70–99)
Potassium: 4.1 mEq/L (ref 3.5–5.1)
Sodium: 143 mEq/L (ref 135–145)

## 2015-07-13 LAB — HEPATIC FUNCTION PANEL
ALT: 23 U/L (ref 0–53)
AST: 16 U/L (ref 0–37)
Albumin: 4.4 g/dL (ref 3.5–5.2)
Alkaline Phosphatase: 57 U/L (ref 39–117)
Bilirubin, Direct: 0.1 mg/dL (ref 0.0–0.3)
Total Bilirubin: 0.6 mg/dL (ref 0.2–1.2)
Total Protein: 7.1 g/dL (ref 6.0–8.3)

## 2015-07-13 LAB — POCT URINALYSIS DIPSTICK
Bilirubin, UA: NEGATIVE
Blood, UA: NEGATIVE
Glucose, UA: NEGATIVE
Ketones, UA: NEGATIVE
Leukocytes, UA: NEGATIVE
Nitrite, UA: NEGATIVE
Spec Grav, UA: 1.02
Urobilinogen, UA: 1
pH, UA: 7

## 2015-07-13 LAB — HEMOGLOBIN A1C: Hgb A1c MFr Bld: 5.5 % (ref 4.6–6.5)

## 2015-07-13 LAB — TSH: TSH: 0.96 u[IU]/mL (ref 0.35–4.50)

## 2015-09-04 ENCOUNTER — Other Ambulatory Visit: Payer: Self-pay | Admitting: Adult Health

## 2015-09-04 MED ORDER — PANTOPRAZOLE SODIUM 40 MG PO TBEC: DELAYED_RELEASE_TABLET | ORAL | Status: DC

## 2015-09-04 MED ORDER — CITALOPRAM HYDROBROMIDE 40 MG PO TABS: 40.0000 mg | ORAL_TABLET | Freq: Every day | ORAL | Status: DC

## 2015-09-04 NOTE — Telephone Encounter (Signed)
Pls advise on citalopram

## 2015-09-04 NOTE — Telephone Encounter (Signed)
Ok to refill Celexa for 90 days with 3 refills. Can fill protonix as well. Alan Brooks does not come in generic but if he goes on the web site he can get a savings card and pay $10 a month

## 2015-09-04 NOTE — Telephone Encounter (Signed)
Rx was sent to the pharmacy  

## 2015-09-04 NOTE — Telephone Encounter (Signed)
Pt needs refill on generic breo, citalopram and pantoprazole #90 each w/refills send to [facility]

## 2015-09-06 ENCOUNTER — Other Ambulatory Visit: Payer: Self-pay | Admitting: Adult Health

## 2015-09-06 MED FILL — BREO ELLIPTA 200-25 MCG INH: 200-25 | 30 days supply | Qty: 60 | Fill #7

## 2015-09-24 ENCOUNTER — Ambulatory Visit (INDEPENDENT_AMBULATORY_CARE_PROVIDER_SITE_OTHER): Payer: BLUE CROSS/BLUE SHIELD | Admitting: Adult Health

## 2015-09-24 ENCOUNTER — Encounter: Payer: Self-pay | Admitting: Adult Health

## 2015-09-24 VITALS — BP 128/70 | Temp 97.6°F | Ht 73.0 in | Wt 214.7 lb

## 2015-09-24 DIAGNOSIS — T148XXA Other injury of unspecified body region, initial encounter: Secondary | ICD-10-CM

## 2015-09-24 DIAGNOSIS — T148 Other injury of unspecified body region: Secondary | ICD-10-CM | POA: Diagnosis not present

## 2015-09-24 MED ORDER — KETOROLAC TROMETHAMINE 60 MG/2ML IM SOLN
60.0000 mg | Freq: Once | INTRAMUSCULAR | Status: AC
  Administered 2015-09-24: 60 mg via INTRAMUSCULAR

## 2015-09-24 MED ORDER — METHYLPREDNISOLONE 4 MG PO TBPK: ORAL_TABLET | ORAL | Status: DC

## 2015-09-24 MED ORDER — CYCLOBENZAPRINE HCL 10 MG PO TABS: 10.0000 mg | ORAL_TABLET | Freq: Three times a day (TID) | ORAL | Status: DC | PRN

## 2015-09-24 MED FILL — METHYLPREDNISOLONE 4 MG TAB: 4 | 6 days supply | Qty: 21 | Fill #0

## 2015-09-24 MED FILL — CYCLOBENZAPRINE 10 MG TAB: 10 | 10 days supply | Qty: 30 | Fill #0

## 2015-09-24 NOTE — Progress Notes (Signed)
Subjective:    Patient ID: Alan Brooks, male    DOB: [birth year], 40 y.o.   MRN: [id number]  HPI 40 year old male who presents to the office today for lower back pain that he first started to experience around 7 days ago. The pain started in his sacral area and become worse as the week went on. The pain was the worst two days ago (Friday) when he woke up the morning after the pain was gone. Yesterday he was eating breakfast when he felt a " tightness" in his mid back. He has been doing stretching exercises, which he endorses makes the pain worse but also makes the pain better. Has also been using a heating pad.  He denies any trauma to the area   Review of Systems  Constitutional: Positive for activity change. Negative for chills.  Respiratory: Negative.   Cardiovascular: Negative.   Gastrointestinal: Negative.   Musculoskeletal: Positive for myalgias and back pain. Negative for joint swelling, arthralgias, gait problem, neck pain and neck stiffness.  Skin: Negative.   Neurological: Negative.   All other systems reviewed and are negative.  Past Medical History  Diagnosis Date  . Anxiety   . Asthma   . Allergy     Social History   Social History  . Marital Status: Single    Spouse Name: N/A  . Number of Children: N/A  . Years of Education: N/A   Occupational History  . Not on file.   Social History Main Topics  . Smoking status: Former Smoker -- 1.00 packs/day for 15 years    Types: Cigarettes  . Smokeless tobacco: Not on file  . Alcohol Use: No  . Drug Use: No  . Sexual Activity: Not on file   Other Topics Concern  . 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  Family History  Problem Relation Age of Onset  . Asthma Brother   . Depression Other     entire family   . Anxiety disorder Other     entire  family   . Heart attack Maternal Grandfather   . Heart attack Paternal Grandfather     No Known Allergies  Current Outpatient Prescriptions on File Prior to Visit  Medication Sig Dispense Refill  . citalopram (CELEXA) 40 MG tablet Take 1 tablet (40 mg total) by mouth daily. 90 tablet 3  . Fluticasone Furoate-Vilanterol (BREO ELLIPTA) 200-25 MCG/INH AEPB Inhale 200 mcg into the lungs daily. 1 each 11  . pantoprazole (PROTONIX) 40 MG tablet TAKE 1 TABLET BY MOUTH ONCE DAILY BEFORE BREAKFAST 90 tablet 0  . PROVENTIL HFA 108 (90 BASE) MCG/ACT inhaler USE AS DIRECTED AS NEEDED 20.1 each 0   No current facility-administered medications on file prior to visit.    BP 128/70 mmHg  Temp(Src) 97.6 F (36.4 C) (Oral)  Ht  (1.854 m)  Wt 214 lb 11.2 oz (97.387 kg)  BMI 28.33 kg/m2       Objective:   Physical Exam  Constitutional: He is oriented to person, place, and time. He appears well-developed and well-nourished. No distress.  Cardiovascular: Normal rate, regular rhythm, normal heart sounds and intact distal pulses.  Exam reveals no gallop and no friction rub.   No murmur heard. Pulmonary/Chest: Effort  normal and breath sounds normal. No respiratory distress. He has no wheezes. He has no rales. He exhibits no tenderness.  Musculoskeletal: Normal range of motion. He exhibits tenderness (mid back on right side of spine).  Neurological: He is alert and oriented to person, place, and time.  Skin: Skin is warm and dry. No rash noted. He is not diaphoretic. No erythema. No pallor.  No bruising, redness or warmth noted  Psychiatric: He has a normal mood and affect. His behavior is normal. Judgment and thought content normal.  Nursing note and vitals reviewed.      Assessment & Plan:  1. Muscle strain - methylPREDNISolone (MEDROL DOSEPAK) 4 MG TBPK tablet; Take as directed  Dispense: 21 tablet; Refill: 0 - cyclobenzaprine (FLEXERIL) 10 MG tablet; Take 1 tablet (10 mg total) by mouth 3  (three) times daily as needed for muscle spasms.  Dispense: 30 tablet; Refill: 0 - Ibuprofen  every 8 hours as needed for pain - Stretching and heating pad

## 2015-09-24 NOTE — Patient Instructions (Addendum)
It was great seeing you today.  Your exam is consistent with a muscle strain. I have sent in a prescription for Flexeril and Prednisone. Take these as directed. You can also take Ibuprofen  every 8 hours as needed for pain.   Please follow up if no improvement in the next 2-3 days.     Muscle Strain A muscle strain is an injury that occurs when a muscle is stretched beyond its normal length. Usually a small number of muscle fibers are torn when this happens. Muscle strain is rated in degrees. First-degree strains have the least amount of muscle fiber tearing and pain. Second-degree and third-degree strains have increasingly more tearing and pain.  Usually, recovery from muscle strain takes 1-2 weeks. Complete healing takes 5-6 weeks.  CAUSES  Muscle strain happens when a sudden, violent force placed on a muscle stretches it too far. This may occur with lifting, sports, or a fall.  RISK FACTORS Muscle strain is especially common in athletes.  SIGNS AND SYMPTOMS At the site of the muscle strain, there may be:  Pain.  Bruising.  Swelling.  Difficulty using the muscle due to pain or lack of normal function. DIAGNOSIS  Your health care provider will perform a physical exam and ask about your medical history. TREATMENT  Often, the best treatment for a muscle strain is resting, icing, and applying cold compresses to the injured area.  HOME CARE INSTRUCTIONS   Use the PRICE method of treatment to promote muscle healing during the first 2-3 days after your injury. The PRICE method involves:  Protecting the muscle from being injured again.  Restricting your activity and resting the injured body part.  Icing your injury. To do this, put ice in a plastic bag. Place a towel between your skin and the bag. Then, apply the ice and leave it on from 15-20 minutes each hour. After the third day, switch to moist heat packs.  Apply compression to the injured area with a splint or elastic  bandage. Be careful not to wrap it too tightly. This may interfere with blood circulation or increase swelling.  Elevate the injured body part above the level of your heart as often as you can.  Only take over-the-counter or prescription medicines for pain, discomfort, or fever as directed by your health care provider.  Warming up prior to exercise helps to prevent future muscle strains. SEEK MEDICAL CARE IF:   You have increasing pain or swelling in the injured area.  You have numbness, tingling, or a significant loss of strength in the injured area. MAKE SURE YOU:   Understand these instructions.  Will watch your condition.  Will get help right away if you are not doing well or get worse.   This information is not intended to replace advice given to you by your health care provider. Make sure you discuss any questions you have with your health care provider.   Document Released: 08/11/2005 Document Revised: 06/01/2013 Document Reviewed: 03/10/2013 Elsevier Interactive Patient Education [employer]

## 2015-09-24 NOTE — Progress Notes (Signed)
Pre visit review using our clinic review tool, if applicable. No additional management support is needed unless otherwise documented below in the visit note. 

## 2015-10-11 MED FILL — PANTOPRAZOLE SOD DR 40 MG T: 40 | 90 days supply | Qty: 90 | Fill #0

## 2015-10-11 MED FILL — BREO ELLIPTA 200-25 MCG INH: 200-25 | 30 days supply | Qty: 60 | Fill #8

## 2015-10-11 MED FILL — CITALOPRAM HBR 40 MG TABLET: 40 | 90 days supply | Qty: 90 | Fill #0

## 2015-10-18 ENCOUNTER — Other Ambulatory Visit: Payer: Self-pay

## 2015-10-18 MED ORDER — ALBUTEROL SULFATE HFA 108 (90 BASE) MCG/ACT IN AERS: 2.0000 | INHALATION_SPRAY | Freq: Four times a day (QID) | RESPIRATORY_TRACT | Status: DC | PRN

## 2015-10-18 NOTE — Telephone Encounter (Signed)
Rx request for Albuterol.  Ok per [place] to send to pharmacy.

## 2015-11-09 MED FILL — BREO ELLIPTA 200-25 MCG INH: 200-25 | 30 days supply | Qty: 60 | Fill #9

## 2015-12-10 MED FILL — BREO ELLIPTA 200-25 MCG INH: 200-25 | 30 days supply | Qty: 60 | Fill #10

## 2016-01-04 ENCOUNTER — Encounter: Payer: Self-pay | Admitting: Adult Health

## 2016-01-04 ENCOUNTER — Ambulatory Visit (INDEPENDENT_AMBULATORY_CARE_PROVIDER_SITE_OTHER): Payer: BLUE CROSS/BLUE SHIELD | Admitting: Adult Health

## 2016-01-04 VITALS — BP 118/64 | Temp 98.5°F | Wt 195.5 lb

## 2016-01-04 DIAGNOSIS — J029 Acute pharyngitis, unspecified: Secondary | ICD-10-CM

## 2016-01-04 MED ORDER — METHYLPREDNISOLONE 4 MG PO TBPK: ORAL_TABLET | ORAL | Status: DC

## 2016-01-04 MED FILL — METHYLPREDNISOLONE 4 MG TAB: 4 | 6 days supply | Qty: 21 | Fill #0

## 2016-01-04 NOTE — Patient Instructions (Addendum)
It was great seeing you.   Your exam is consistent with pharyngitis.   I have sent in a prescription for medrol dose pack. Take as directed.   Gargle with warm saltwater  Use honey.   Follow up if no improvement.   Pharyngitis Pharyngitis is redness, pain, and swelling (inflammation) of your pharynx.  CAUSES  Pharyngitis is usually caused by infection. Most of the time, these infections are from viruses (viral) and are part of a cold. However, sometimes pharyngitis is caused by bacteria (bacterial). Pharyngitis can also be caused by allergies. Viral pharyngitis may be spread from person to person by coughing, sneezing, and personal items or utensils (cups, forks, spoons, toothbrushes). Bacterial pharyngitis may be spread from person to person by more intimate contact, such as kissing.  SIGNS AND SYMPTOMS  Symptoms of pharyngitis include:   Sore throat.   Tiredness (fatigue).   Low-grade fever.   Headache.  Joint pain and muscle aches.  Skin rashes.  Swollen lymph nodes.  Plaque-like film on throat or tonsils (often seen with bacterial pharyngitis). DIAGNOSIS  Your health care provider will ask you questions about your illness and your symptoms. Your medical history, along with a physical exam, is often all that is needed to diagnose pharyngitis. Sometimes, a rapid strep test is done. Other lab tests may also be done, depending on the suspected cause.  TREATMENT  Viral pharyngitis will usually get better in 3-4 days without the use of medicine. Bacterial pharyngitis is treated with medicines that kill germs (antibiotics).  HOME CARE INSTRUCTIONS   Drink enough water and fluids to keep your urine clear or pale yellow.   Only take over-the-counter or prescription medicines as directed by your health care provider:   If you are prescribed antibiotics, make sure you finish them even if you start to feel better.   Do not take aspirin.   Get lots of rest.   Gargle  with 8 oz of salt water ( tsp of salt per 1 qt of water) as often as every 1-2 hours to soothe your throat.   Throat lozenges (if you are not at risk for choking) or sprays may be used to soothe your throat. SEEK MEDICAL CARE IF:   You have large, tender lumps in your neck.  You have a rash.  You cough up green, yellow-brown, or bloody spit. SEEK IMMEDIATE MEDICAL CARE IF:   Your neck becomes stiff.  You drool or are unable to swallow liquids.  You vomit or are unable to keep medicines or liquids down.  You have severe pain that does not go away with the use of recommended medicines.  You have trouble breathing (not caused by a stuffy nose). MAKE SURE YOU:   Understand these instructions.  Will watch your condition.  Will get help right away if you are not doing well or get worse.   This information is not intended to replace advice given to you by your health care provider. Make sure you discuss any questions you have with your health care provider.   Document Released: 08/11/2005 Document Revised: 06/01/2013 Document Reviewed: 04/18/2013 Elsevier Interactive Patient Education [employer] Inc[employer]

## 2016-01-04 NOTE — Progress Notes (Signed)
Subjective:    Patient ID: Alan Brooks, male    DOB: [birth year], 40 y.o.   MRN: [id number]  HPI 40 year old male who presents to the office for throat pain for one week. The pain is described as " swallowing sand paper", it it is located on the right side. Pain is not worse when swallowing. Has not had any fevers, nausea, vomiting, feeling acutely ill.   He did have a hoarse voice this week but feels as though it is getting better.   He has tried Claritin- D, which did not work.     Review of Systems  Constitutional: Negative.   HENT: Positive for sore throat and voice change. Negative for nosebleeds, postnasal drip, rhinorrhea, sinus pressure and trouble swallowing.   Eyes: Negative.   Neurological: Negative.   All other systems reviewed and are negative.  Past Medical History  Diagnosis Date  . Anxiety   . Asthma   . Allergy     Social History   Social History  . Marital Status: Single    Spouse Name: N/A  . Number of Children: N/A  . Years of Education: N/A   Occupational History  . Not on file.   Social History Main Topics  . Smoking status: Former Smoker -- 1.00 packs/day for 15 years    Types: Cigarettes  . Smokeless tobacco: Not on file  . Alcohol Use: No  . Drug Use: No  . Sexual Activity: Not on file   Other Topics Concern  . 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  Family History  Problem Relation Age of Onset  . Asthma Brother   . Depression Other     entire family   . Anxiety disorder Other     entire family   . Heart attack Maternal Grandfather   . Heart attack Paternal Grandfather     No Known Allergies  Current Outpatient Prescriptions on File Prior to Visit  Medication Sig Dispense Refill  . albuterol (PROVENTIL HFA) 108 (90 Base) MCG/ACT inhaler Inhale 2 puffs into the  lungs every 6 (six) hours as needed for wheezing or shortness of breath. 18 each 0  . citalopram (CELEXA) 40 MG tablet Take 1 tablet (40 mg total) by mouth daily. 90 tablet 3  . Fluticasone Furoate-Vilanterol (BREO ELLIPTA) 200-25 MCG/INH AEPB Inhale 200 mcg into the lungs daily. 1 each 11  . pantoprazole (PROTONIX) 40 MG tablet TAKE 1 TABLET BY MOUTH ONCE DAILY BEFORE BREAKFAST 90 tablet 0   No current facility-administered medications on file prior to visit.    BP 118/64 mmHg  Temp(Src) 98.5 F (36.9 C) (Oral)  Wt 195 lb 8 oz (88.678 kg)       Objective:   Physical Exam  Constitutional: He is oriented to person, place, and time. He appears well-developed and well-nourished. No distress.  HENT:  Head: Normocephalic and atraumatic.  Right Ear: External ear normal.  Left Ear: External ear normal.  Nose: Nose normal.  Mouth/Throat: Uvula is midline and mucous membranes are normal. Posterior oropharyngeal edema (slightly enlarged right tonsil ) present. No oropharyngeal exudate or posterior oropharyngeal erythema.  Cardiovascular: Normal rate, regular rhythm, normal heart sounds and intact distal pulses.  Exam reveals no gallop and no friction  rub.   No murmur heard. Pulmonary/Chest: Effort normal and breath sounds normal. No respiratory distress. He has no wheezes. He has no rales. He exhibits no tenderness.  Neurological: He is alert and oriented to person, place, and time.  Skin: Skin is warm and dry. No rash noted. He is not diaphoretic. No erythema. No pallor.  Psychiatric: He has a normal mood and affect. His behavior is normal. Judgment and thought content normal.  Nursing note reviewed.     Assessment & Plan:  1. Acute pharyngitis, unspecified etiology - Not concerned for strep. He has to sing this weekend. Will trial prednisone to see if it helps with symptoms - methylPREDNISolone (MEDROL DOSEPAK) 4 MG TBPK  tablet; Take as directed  Dispense: 21 tablet; Refill: 0 -  Follow up as needed  Shirline Freesory Kinlee Garrison, NP

## 2016-01-14 MED FILL — PANTOPRAZOLE SOD DR 40 MG T: 40 | 30 days supply | Qty: 30 | Fill #0

## 2016-01-14 MED FILL — CITALOPRAM HBR 40 MG TABLET: 40 | 90 days supply | Qty: 90 | Fill #1

## 2016-01-14 MED FILL — BREO ELLIPTA 200-25 MCG INH: 200-25 | 30 days supply | Qty: 60 | Fill #11

## 2016-02-12 MED FILL — BREO ELLIPTA 200-25 MCG INH: 200-25 | 30 days supply | Qty: 60 | Fill #0

## 2016-02-12 MED FILL — PANTOPRAZOLE SOD DR 40 MG T: 40 | 30 days supply | Qty: 30 | Fill #1

## 2016-03-12 ENCOUNTER — Other Ambulatory Visit: Payer: Self-pay | Admitting: Adult Health

## 2016-03-12 MED FILL — BREO ELLIPTA 200-25 MCG INH: 200-25 | 30 days supply | Qty: 60 | Fill #1

## 2016-03-12 MED FILL — PANTOPRAZOLE SOD DR 40 MG T: 40 | 30 days supply | Qty: 30 | Fill #2

## 2016-04-14 MED FILL — PANTOPRAZOLE SOD DR 40 MG T: 40 | 90 days supply | Qty: 90 | Fill #0

## 2016-04-14 MED FILL — CITALOPRAM HBR 40 MG TABLET: 40 | 90 days supply | Qty: 90 | Fill #2

## 2016-04-14 MED FILL — BREO ELLIPTA 200-25 MCG INH: 200-25 | 30 days supply | Qty: 60 | Fill #2

## 2016-04-14 MED FILL — VENTOLIN HFA 90 MCG INHALER: 108 (90 BAS | 25 days supply | Qty: 18 | Fill #0

## 2016-05-06 ENCOUNTER — Encounter: Payer: Self-pay | Admitting: Adult Health

## 2016-05-06 ENCOUNTER — Ambulatory Visit (INDEPENDENT_AMBULATORY_CARE_PROVIDER_SITE_OTHER): Payer: BLUE CROSS/BLUE SHIELD | Admitting: Adult Health

## 2016-05-06 VITALS — BP 120/80 | Temp 98.2°F | Wt 206.8 lb

## 2016-05-06 DIAGNOSIS — L219 Seborrheic dermatitis, unspecified: Secondary | ICD-10-CM | POA: Diagnosis not present

## 2016-05-06 DIAGNOSIS — F411 Generalized anxiety disorder: Secondary | ICD-10-CM

## 2016-05-06 DIAGNOSIS — Z23 Encounter for immunization: Secondary | ICD-10-CM

## 2016-05-06 DIAGNOSIS — D1801 Hemangioma of skin and subcutaneous tissue: Secondary | ICD-10-CM | POA: Diagnosis not present

## 2016-05-06 DIAGNOSIS — D235 Other benign neoplasm of skin of trunk: Secondary | ICD-10-CM | POA: Diagnosis not present

## 2016-05-06 DIAGNOSIS — L821 Other seborrheic keratosis: Secondary | ICD-10-CM | POA: Diagnosis not present

## 2016-05-06 NOTE — Progress Notes (Signed)
Subjective:    Patient ID: Alan Brooks, male    DOB: 1975/10/26, 40 y.o.   MRN: [id number]  HPI   40 year old male who  has a past medical history of Allergy; Anxiety; and Asthma. He presents to the office today for concern for anxiety. He has been on Celexa for " 6-8 years". He feels as though he has been having more panic attacks over the last month or two. These panic attacks have been happening with more frequency. He has started formally exercising and is doing karate twice a week. He has not seen a difference in the amount of panic attacks throughout the week but feels as though on days when he does go to karate his attacks are better.   His panic attacks only last a few minutes  Denies any thoughts of SI or self harm   Review of Systems  Constitutional: Negative.   HENT: Negative.   Eyes: Negative.   Respiratory: Negative.   Cardiovascular: Negative.   Gastrointestinal: Negative.   Neurological: Negative.   Hematological: Negative.   Psychiatric/Behavioral: Negative for agitation, behavioral problems, confusion, decreased concentration, dysphoric mood, hallucinations, self-injury, sleep disturbance and suicidal ideas. The patient is nervous/anxious. The patient is not hyperactive.   All other systems reviewed and are negative.  Past Medical History:  Diagnosis Date  . Allergy   . Anxiety   . Asthma     Social History   Social History  . Marital status: Single    Spouse name: N/A  . Number of children: N/A  . Years of education: N/A   Occupational History  . Not on file.   Social History Main Topics  . Smoking status: Former Smoker    Packs/day: 1.00    Years: 15.00    Types: Cigarettes  . Smokeless tobacco: Not on file  . Alcohol use No  . Drug use: No  . Sexual activity: Not on file   Other Topics Concern  . 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  Family History  Problem Relation Age of Onset  . Asthma Brother   . Depression Other     entire family   . Anxiety disorder Other     entire family   . Heart attack Maternal Grandfather   . Heart attack Paternal Grandfather     No Known Allergies  Current Outpatient Prescriptions on File Prior to Visit  Medication Sig Dispense Refill  . albuterol (PROVENTIL HFA) 108 (90 Base) MCG/ACT inhaler Inhale 2 puffs into the lungs every 6 (six) hours as needed for wheezing or shortness of breath. 18 each 0  . citalopram (CELEXA) 40 MG tablet Take 1 tablet (40 mg total) by mouth daily. 90 tablet 3  . Fluticasone Furoate-Vilanterol (BREO ELLIPTA) 200-25 MCG/INH AEPB Inhale 200 mcg into the lungs daily. 1 each 11  . pantoprazole (PROTONIX) 40 MG tablet TAKE 1 TABLET BY MOUTH ONCE DAILY BEFORE BREAKFAST 90 tablet 0   No current facility-administered medications on file prior to visit.     BP 120/80 (BP Location: Right Arm, Patient Position: Sitting, Cuff Size: Normal)   Temp 98.2 F (36.8 C) (Oral)   Wt 206 lb 12.8 oz (93.8 kg)   BMI 27.28 kg/m  Objective:   Physical Exam  Constitutional: He is oriented to person, place, and time. He appears well-developed and well-nourished. No distress.  Cardiovascular: Normal rate, regular rhythm, normal heart sounds and intact distal pulses.  Exam reveals no gallop and no friction rub.   No murmur heard. Pulmonary/Chest: Effort normal and breath sounds normal. No respiratory distress. He has no wheezes. He has no rales. He exhibits no tenderness.  Musculoskeletal: Normal range of motion. He exhibits no edema, tenderness or deformity.  Neurological: He is alert and oriented to person, place, and time.  Skin: Skin is warm and dry. No rash noted. He is not diaphoretic. No erythema. No pallor.  Psychiatric: He has a normal mood and affect. His behavior is normal. Judgment and thought content  normal.  Nursing note and vitals reviewed.     Assessment & Plan:  1. Anxiety state - We have decided to continue with Celexa at this time. He is going to increase his exercise weekly and see if this helps.  - He will follow up with me if feels as though this does work  - Consider changing to Zoloft  2. Need for influenza vaccination - Flu Vaccine QUAD 36+ mos IM   Shirline Frees, NP

## 2016-05-20 MED FILL — BREO ELLIPTA 200-25 MCG INH: 200-25 | 30 days supply | Qty: 60 | Fill #3

## 2016-06-16 ENCOUNTER — Other Ambulatory Visit: Payer: Self-pay | Admitting: Adult Health

## 2016-06-16 MED FILL — BREO ELLIPTA 200-25 MCG INH: 200-25 | 90 days supply | Qty: 180 | Fill #4

## 2016-06-17 DIAGNOSIS — L700 Acne vulgaris: Secondary | ICD-10-CM | POA: Diagnosis not present

## 2016-06-17 DIAGNOSIS — L219 Seborrheic dermatitis, unspecified: Secondary | ICD-10-CM | POA: Diagnosis not present

## 2016-07-21 MED FILL — PANTOPRAZOLE SOD DR 40 MG T: 40 | 90 days supply | Qty: 90 | Fill #0

## 2016-07-21 MED FILL — CITALOPRAM HBR 40 MG TABLET: 40 | 90 days supply | Qty: 90 | Fill #3

## 2016-07-21 MED FILL — CICLOPIROX 1% SHAMPOO: 1 | 30 days supply | Qty: 120 | Fill #0

## 2016-09-15 ENCOUNTER — Other Ambulatory Visit: Payer: Self-pay | Admitting: Adult Health

## 2016-09-15 DIAGNOSIS — Z760 Encounter for issue of repeat prescription: Secondary | ICD-10-CM

## 2016-09-15 DIAGNOSIS — J4540 Moderate persistent asthma, uncomplicated: Secondary | ICD-10-CM

## 2016-09-15 NOTE — Telephone Encounter (Signed)
Ok to refill all for 6 months. Please remind him that he is due for his physical

## 2016-09-16 NOTE — Telephone Encounter (Signed)
Patient notified and physical has been scheduled.

## 2016-09-17 ENCOUNTER — Encounter: Payer: Self-pay | Admitting: Adult Health

## 2016-09-17 ENCOUNTER — Ambulatory Visit (INDEPENDENT_AMBULATORY_CARE_PROVIDER_SITE_OTHER): Payer: BLUE CROSS/BLUE SHIELD | Admitting: Adult Health

## 2016-09-17 VITALS — BP 106/74 | Temp 98.6°F | Ht 73.0 in | Wt 209.4 lb

## 2016-09-17 DIAGNOSIS — F411 Generalized anxiety disorder: Secondary | ICD-10-CM | POA: Diagnosis not present

## 2016-09-17 DIAGNOSIS — Z0000 Encounter for general adult medical examination without abnormal findings: Secondary | ICD-10-CM

## 2016-09-17 DIAGNOSIS — J4520 Mild intermittent asthma, uncomplicated: Secondary | ICD-10-CM

## 2016-09-17 LAB — POC URINALSYSI DIPSTICK (AUTOMATED)
Bilirubin, UA: NEGATIVE
Blood, UA: NEGATIVE
Glucose, UA: NEGATIVE
Ketones, UA: NEGATIVE
Leukocytes, UA: NEGATIVE
Nitrite, UA: NEGATIVE
Protein, UA: NEGATIVE
Spec Grav, UA: >=1.03
Urobilinogen, UA: 0.2
pH, UA: 5.5

## 2016-09-17 LAB — CBC WITH DIFFERENTIAL/PLATELET
Basophils Absolute: 0 10*3/uL (ref 0.0–0.1)
Basophils Relative: 0.7 % (ref 0.0–3.0)
Eosinophils Absolute: 0.4 10*3/uL (ref 0.0–0.7)
Eosinophils Relative: 5.5 % — ABNORMAL HIGH (ref 0.0–5.0)
HCT: 46.8 % (ref 39.0–52.0)
Hemoglobin: 15.8 g/dL (ref 13.0–17.0)
Lymphocytes Relative: 35.2 % (ref 12.0–46.0)
Lymphs Abs: 2.4 10*3/uL (ref 0.7–4.0)
MCHC: 33.8 g/dL (ref 30.0–36.0)
MCV: 92.1 fl (ref 78.0–100.0)
Monocytes Absolute: 0.6 10*3/uL (ref 0.1–1.0)
Monocytes Relative: 8.6 % (ref 3.0–12.0)
Neutro Abs: 3.5 10*3/uL (ref 1.4–7.7)
Neutrophils Relative %: 50 % (ref 43.0–77.0)
Platelets: 224 10*3/uL (ref 150.0–400.0)
RBC: 5.08 Mil/uL (ref 4.22–5.81)
RDW: 13.7 % (ref 11.5–15.5)
WBC: 6.9 10*3/uL (ref 4.0–10.5)

## 2016-09-17 LAB — HEPATIC FUNCTION PANEL
ALT: 31 U/L (ref 0–53)
AST: 19 U/L (ref 0–37)
Albumin: 4.4 g/dL (ref 3.5–5.2)
Alkaline Phosphatase: 69 U/L (ref 39–117)
Bilirubin, Direct: 0.1 mg/dL (ref 0.0–0.3)
Total Bilirubin: 0.5 mg/dL (ref 0.2–1.2)
Total Protein: 7.1 g/dL (ref 6.0–8.3)

## 2016-09-17 LAB — BASIC METABOLIC PANEL
BUN: 13 mg/dL (ref 6–23)
CO2: 28 mEq/L (ref 19–32)
Calcium: 9.4 mg/dL (ref 8.4–10.5)
Chloride: 107 mEq/L (ref 96–112)
Creatinine, Ser: 1.01 mg/dL (ref 0.40–1.50)
GFR: 87.55 mL/min (ref >60.00)
Glucose, Bld: 78 mg/dL (ref 70–99)
Potassium: 4.5 mEq/L (ref 3.5–5.1)
Sodium: 141 mEq/L (ref 135–145)

## 2016-09-17 LAB — TSH: TSH: 0.84 u[IU]/mL (ref 0.35–4.50)

## 2016-09-17 MED FILL — BREO ELLIPTA 200-25 MCG INH: 200-25 | 30 days supply | Qty: 60 | Fill #0

## 2016-09-17 NOTE — Progress Notes (Signed)
Subjective:    Patient ID: Alan Brooks, male    DOB: 1976/05/08, 41 y.o.   MRN: [id number]  HPI  Patient presents for yearly preventative medicine examination. He is a pleasant and healthy 41 year old male who  has a past medical history of Allergy; Anxiety; and Asthma.  All immunizations and health maintenance protocols were reviewed with the patient and needed orders were placed. He is up to date on his vaccinations  Appropriate screening laboratory values were ordered for the patient including screening of hyperlipidemia, renal function and hepatic function.  Medication reconciliation,  past medical history, social history, problem list and allergies were reviewed in detail with the patient  Goals were established with regard to weight loss, exercise, and  diet in compliance with medications. He has started doing martial arts and is enjoying being more active.   Anxiety is well controlled with Celexa 40mg .   GERD is controlled with protonix and diet  He has no acute complaints.    Review of Systems  Constitutional: Negative.   HENT: Negative.   Eyes: Negative.   Respiratory: Negative.   Cardiovascular: Negative.   Gastrointestinal: Negative.   Endocrine: Negative.   Genitourinary: Negative.   Musculoskeletal: Negative.   Allergic/Immunologic: Negative.   Neurological: Negative.   Hematological: Negative.   Psychiatric/Behavioral: Negative.   All other systems reviewed and are negative.  Past Medical History:  Diagnosis Date  . Allergy   . Anxiety   . Asthma     Social History   Social History  . Marital status: Single    Spouse name: N/A  . Number of children: N/A  . Years of education: N/A   Occupational History  . Not on file.   Social History Main Topics  . Smoking status: Former Smoker    Packs/day: 1.00    Years: 15.00    Types: Cigarettes  . Smokeless tobacco: Not on file  . Alcohol use No  . Drug use: No  . Sexual activity: Not on  file   Other Topics Concern  . 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  Family History  Problem Relation Age of Onset  . Heart attack Maternal Grandfather   . Heart attack Paternal Grandfather   . Asthma Brother   . Depression Other     entire family   . Anxiety disorder Other     entire family     No Known Allergies  Current Outpatient Prescriptions on File Prior to Visit  Medication Sig Dispense Refill  . BREO ELLIPTA 200-25 MCG/INH AEPB INHALE 1 PUFF BY MOUTH INTO LUNGS DAILY 60 each 11  . citalopram (CELEXA) 40 MG tablet TAKE 1 TABLET BY MOUTH ONCE DAILY 90 tablet 3  . pantoprazole (PROTONIX) 40 MG tablet TAKE 1 TABLET BY MOUTH ONCE DAILY BEFORE BREAKFAST 90 tablet 0  . VENTOLIN HFA 108 (90 Base) MCG/ACT inhaler INHALE 2 PUFFS BY MOUTH INTO THE LUNGS EVERY 6 HOURS AS NEEDED FOR WHEEZING OR SHORTNESS OF BREATH 18 g 0   No current facility-administered medications on file prior to visit.     BP 106/74   Temp 98.6 F (37 C) (Oral)   Ht 6\' 1"  (1.854 m)   Wt 209 lb 6.4 oz (95 kg)  BMI 27.63 kg/m       Objective:   Physical Exam  Constitutional: He is oriented to person, place, and time. He appears well-developed and well-nourished. No distress.  HENT:  Head: Normocephalic and atraumatic.  Right Ear: External ear normal.  Left Ear: External ear normal.  Nose: Nose normal.  Mouth/Throat: Oropharynx is clear and moist.  Cardiovascular: Normal rate, regular rhythm, normal heart sounds and intact distal pulses.  Exam reveals no gallop and no friction rub.   No murmur heard. Pulmonary/Chest: Effort normal and breath sounds normal. No respiratory distress. He has no wheezes. He has no rales. He exhibits no tenderness.  Abdominal: Soft. Bowel sounds are normal. He exhibits no distension and no mass. There is no  tenderness. There is no rebound and no guarding.  Musculoskeletal: Normal range of motion. He exhibits no edema, tenderness or deformity.  Neurological: He is alert and oriented to person, place, and time. He has normal reflexes. He displays normal reflexes. No cranial nerve deficit. He exhibits normal muscle tone. Coordination normal.  Skin: Skin is warm and dry. No rash noted. He is not diaphoretic. No erythema. No pallor.  Psychiatric: He has a normal mood and affect. His behavior is normal. Judgment and thought content normal.  Nursing note and vitals reviewed.     Assessment & Plan:  1. Routine general medical examination at a health care facility - Basic metabolic panel - CBC with Differential/Platelet - Hepatic function panel - POCT Urinalysis Dipstick (Automated) - TSH - Continue to stay active and eat healthy  - Follow up in one year or sooner if needed - Unable to do lipid panel today as he was not fasting.  2. Mild intermittent asthma without complication - Continue with Breo and Ventolin   3. Anxiety state - Well controlled with Celexa   Shirline Frees, NP

## 2016-10-09 MED FILL — PANTOPRAZOLE SOD DR 40 MG T: 40 | 90 days supply | Qty: 90 | Fill #0

## 2016-10-09 MED FILL — CITALOPRAM HBR 40 MG TABLET: 40 | 90 days supply | Qty: 90 | Fill #0

## 2016-10-10 MED FILL — BREO ELLIPTA 200-25 MCG INH: 200-25 | 30 days supply | Qty: 60 | Fill #1

## 2016-11-18 ENCOUNTER — Other Ambulatory Visit: Payer: Self-pay | Admitting: Adult Health

## 2016-11-18 DIAGNOSIS — J4540 Moderate persistent asthma, uncomplicated: Secondary | ICD-10-CM

## 2016-11-18 DIAGNOSIS — Z760 Encounter for issue of repeat prescription: Secondary | ICD-10-CM

## 2016-11-19 MED FILL — BREO ELLIPTA 200-25 MCG INH: 200-25 | 30 days supply | Qty: 60 | Fill #2

## 2016-12-10 ENCOUNTER — Ambulatory Visit (INDEPENDENT_AMBULATORY_CARE_PROVIDER_SITE_OTHER): Payer: BLUE CROSS/BLUE SHIELD | Admitting: Adult Health

## 2016-12-10 VITALS — BP 108/64 | Temp 98.3°F | Ht 73.0 in | Wt 207.2 lb

## 2016-12-10 DIAGNOSIS — T148XXA Other injury of unspecified body region, initial encounter: Secondary | ICD-10-CM | POA: Diagnosis not present

## 2016-12-10 NOTE — Progress Notes (Signed)
Subjective:    Patient ID: Alan Brooks, male    DOB: [birth year], 41 y.o.   MRN: [id number]  HPI  41 year old male who  has a past medical history of Allergy; Anxiety; and Asthma. He presents to the office today for the acute complaint of left leg pain intermittently for two months. The pain feels as though it is a " twinge" and happens when he is walking, or stretching. He is unsure if he injured it during karate or yoga.   He has not tried anything for pain relief.   Denies any bruising, issues with gait or ambulation.   Denies knee pain    Review of Systems See hPI   Past Medical History:  Diagnosis Date  . Allergy   . Anxiety   . Asthma     Social History   Social History  . Marital status: Single    Spouse name: N/A  . Number of children: N/A  . Years of education: N/A   Occupational History  . Not on file.   Social History Main Topics  . Smoking status: Former Smoker    Packs/day: 1.00    Years: 15.00    Types: Cigarettes  . Smokeless tobacco: Not on file  . Alcohol use No  . Drug use: No  . Sexual activity: Not on file   Other Topics Concern  . 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  Family History  Problem Relation Age of Onset  . Heart attack Maternal Grandfather   . Heart attack Paternal Grandfather   . Asthma Brother   . Depression Other     entire family   . Anxiety disorder Other     entire family     No Known Allergies  Current Outpatient Prescriptions on File Prior to Visit  Medication Sig Dispense Refill  . BREO ELLIPTA 200-25 MCG/INH AEPB INHALE 1 PUFF BY MOUTH INTO LUNGS DAILY 60 each 11  . citalopram (CELEXA) 40 MG tablet TAKE 1 TABLET BY MOUTH ONCE DAILY 90 tablet 3  . pantoprazole (PROTONIX) 40 MG tablet TAKE 1 TABLET BY MOUTH ONCE DAILY BEFORE BREAKFAST 90 tablet 0    . VENTOLIN HFA 108 (90 Base) MCG/ACT inhaler INHALE 2 PUFFS BY MOUTH INTO THE LUNGS EVERY 6 HOURS AS NEEDED FOR WHEEZING OR SHORTNESS OF BREATH 18 g 0   No current facility-administered medications on file prior to visit.     BP 108/64 (BP Location: Left Arm, Patient Position: Sitting, Cuff Size: Normal)   Temp 98.3 F (36.8 C) (Oral)   Ht  (1.854 m)   Wt 207 lb 3.2 oz (94 kg)   BMI 27.34 kg/m       Objective:   Physical Exam  Constitutional: He is oriented to person, place, and time. He appears well-developed and well-nourished. No distress.  Cardiovascular: Normal rate, regular rhythm, normal heart sounds and intact distal pulses.  Exam reveals no gallop and no friction rub.   No murmur heard. Musculoskeletal: Normal range of motion. He exhibits no edema or tenderness.  Unable to reproduce pain with ROM exercises. Pain is located on left lateral Soleus muscle. No bruising noted.   Neurological: He is alert and oriented to person, place, and time.  Skin: Skin is warm and dry. No rash noted. He is not diaphoretic. No erythema. No pallor.  Psychiatric: He has a normal mood and affect. His behavior is normal. Judgment and thought content normal.  Nursing note and vitals reviewed.     Assessment & Plan:  1. Muscle strain - Appears to be muscle strain.  - Advised rest, ice and anti inflammatories.  - Follow up if no improvement  - Consider referral to sports medicine   Shirline Frees, NP

## 2016-12-11 ENCOUNTER — Ambulatory Visit: Payer: BLUE CROSS/BLUE SHIELD | Admitting: Adult Health

## 2016-12-17 MED FILL — BREO ELLIPTA 200-25 MCG INH: 200-25 | 30 days supply | Qty: 60 | Fill #3

## 2017-01-16 ENCOUNTER — Other Ambulatory Visit: Payer: Self-pay | Admitting: Adult Health

## 2017-01-16 MED FILL — PANTOPRAZOLE SOD DR 40 MG T: 40 | 90 days supply | Qty: 90 | Fill #0

## 2017-01-16 MED FILL — CITALOPRAM HBR 40 MG TABLET: 40 | 90 days supply | Qty: 90 | Fill #1

## 2017-01-16 MED FILL — BREO ELLIPTA 200-25 MCG INH: 200-25 | 30 days supply | Qty: 60 | Fill #4

## 2017-01-16 NOTE — Telephone Encounter (Signed)
Ok to refill for one year  

## 2017-02-12 DIAGNOSIS — S86812A Strain of other muscle(s) and tendon(s) at lower leg level, left leg, initial encounter: Secondary | ICD-10-CM | POA: Diagnosis not present

## 2017-02-12 MED FILL — predniSONE 20 MG TABS: 20 | 7 days supply | Qty: 14 | Fill #0

## 2017-02-17 DIAGNOSIS — M79662 Pain in left lower leg: Secondary | ICD-10-CM | POA: Diagnosis not present

## 2017-02-17 MED FILL — BREO ELLIPTA 200-25 MCG INH: 200-25 | 30 days supply | Qty: 60 | Fill #5

## 2017-02-20 DIAGNOSIS — M79662 Pain in left lower leg: Secondary | ICD-10-CM | POA: Diagnosis not present

## 2017-02-23 DIAGNOSIS — M79662 Pain in left lower leg: Secondary | ICD-10-CM | POA: Diagnosis not present

## 2017-02-27 DIAGNOSIS — M79662 Pain in left lower leg: Secondary | ICD-10-CM | POA: Diagnosis not present

## 2017-03-02 ENCOUNTER — Encounter: Payer: Self-pay | Admitting: Family Medicine

## 2017-03-02 ENCOUNTER — Ambulatory Visit (INDEPENDENT_AMBULATORY_CARE_PROVIDER_SITE_OTHER): Payer: BLUE CROSS/BLUE SHIELD | Admitting: Family Medicine

## 2017-03-02 VITALS — BP 120/84 | HR 76 | Temp 98.4°F | Wt 204.0 lb

## 2017-03-02 DIAGNOSIS — M79662 Pain in left lower leg: Secondary | ICD-10-CM | POA: Diagnosis not present

## 2017-03-02 DIAGNOSIS — L739 Follicular disorder, unspecified: Secondary | ICD-10-CM

## 2017-03-02 MED ORDER — DOXYCYCLINE HYCLATE 100 MG PO CAPS: 100.0000 mg | ORAL_CAPSULE | Freq: Two times a day (BID) | ORAL | 0 refills | Status: DC

## 2017-03-02 MED FILL — DOXYCYCLINE HYC 100 MG TAB: 100 | 10 days supply | Qty: 20 | Fill #0

## 2017-03-02 MED FILL — CICLOPIROX 1% SHAMPOO: 1 | 30 days supply | Qty: 120 | Fill #1

## 2017-03-02 NOTE — Progress Notes (Signed)
Subjective:     Patient ID: Alan Brooks, male   DOB: [birth year], 41 y.o.   MRN: [id number]  HPI Patient seen with onset of rash last Thursday. He states he saw orthopedist recently was prescribed prednisone course and finished that about a week ago. He wondered if his current rash had something to do with coming off prednisone. He has a pinpoint pustular rash which is confined mostly to the upper extremities and trunk. Nonpruritic. Nonpainful. No fevers or chills. No recent hot tub use. He did recently start using a different type of soap couple weeks ago. Not sure of the exact type.  Past Medical History:  Diagnosis Date  . Allergy   . Anxiety   . Asthma    No past surgical history on file.  reports that he has quit smoking. His smoking use included Cigarettes. He has a 15.00 pack-year smoking history. He has never used smokeless tobacco. He reports that he does not drink alcohol or use drugs. family history includes Anxiety disorder in his other; Asthma in his brother; Depression in his other; Heart attack in his maternal grandfather and paternal grandfather. No Known Allergies   Review of Systems  Constitutional: Negative for chills and fever.  Skin: Positive for rash.       Objective:   Physical Exam  Constitutional: He appears well-developed and well-nourished.  Cardiovascular: Normal rate and regular rhythm.   Pulmonary/Chest: Effort normal and breath sounds normal. No respiratory distress. He has no wheezes. He has no rales.  Skin: Rash noted.  Patient has small erythematous papules and pustules scattered on his extremities including upper extremities and trunk. Sparing of the lower extremities       Assessment:     Patient has widespread folliculitis type rash mostly involving his trunk and upper extremities    Plan:     -Stay out of the heat as much as possible until rash is cleared -Doxycycline 100 mg twice daily -Follow-up with primary in one week if not  improving -Consider good anti-staph soap such as Lever 2000 or Dial.  Kristian CoveyBruce W Geri Hepler MD [facility]

## 2017-03-02 NOTE — Patient Instructions (Signed)
Consider soap such as Lever 2000 or Dial.

## 2017-03-06 DIAGNOSIS — M79662 Pain in left lower leg: Secondary | ICD-10-CM | POA: Diagnosis not present

## 2017-03-09 DIAGNOSIS — M79662 Pain in left lower leg: Secondary | ICD-10-CM | POA: Diagnosis not present

## 2017-03-12 DIAGNOSIS — M79662 Pain in left lower leg: Secondary | ICD-10-CM | POA: Diagnosis not present

## 2017-03-17 MED FILL — BREO ELLIPTA 200-25 MCG INH: 200-25 | 30 days supply | Qty: 60 | Fill #6

## 2017-04-17 MED FILL — PANTOPRAZOLE SOD DR 40 MG T: 40 | 90 days supply | Qty: 90 | Fill #1

## 2017-04-17 MED FILL — CITALOPRAM HBR 40 MG TABLET: 40 | 90 days supply | Qty: 90 | Fill #2

## 2017-04-17 MED FILL — BREO ELLIPTA 200-25 MCG INH: 200-25 | 30 days supply | Qty: 60 | Fill #7

## 2017-05-13 MED FILL — BREO ELLIPTA 200-25 MCG INH: 200-25 | 30 days supply | Qty: 60 | Fill #8

## 2017-05-18 MED FILL — CICLOPIROX 1% SHAMPOO: 1 | 30 days supply | Qty: 120 | Fill #0

## 2017-05-18 MED FILL — CICLOPIROX 0.77% GEL: 0.77 | 30 days supply | Qty: 100 | Fill #0

## 2017-06-17 MED FILL — BREO ELLIPTA 200-25 MCG INH: 200-25 | 30 days supply | Qty: 60 | Fill #9

## 2017-06-19 DIAGNOSIS — L700 Acne vulgaris: Secondary | ICD-10-CM | POA: Diagnosis not present

## 2017-06-19 DIAGNOSIS — L218 Other seborrheic dermatitis: Secondary | ICD-10-CM | POA: Diagnosis not present

## 2017-06-19 DIAGNOSIS — L820 Inflamed seborrheic keratosis: Secondary | ICD-10-CM | POA: Diagnosis not present

## 2017-06-19 MED FILL — CICLOPIROX 0.77% GEL: 0.77 | 30 days supply | Qty: 100 | Fill #0

## 2017-06-19 MED FILL — CICLOPIROX 1 % SHAM: 1 | 30 days supply | Qty: 120 | Fill #0

## 2017-06-26 ENCOUNTER — Encounter: Payer: Self-pay | Admitting: Physician Assistant

## 2017-06-26 ENCOUNTER — Ambulatory Visit (INDEPENDENT_AMBULATORY_CARE_PROVIDER_SITE_OTHER): Payer: BLUE CROSS/BLUE SHIELD | Admitting: Physician Assistant

## 2017-06-26 VITALS — BP 124/70 | HR 83 | Temp 98.8°F | Resp 16 | Ht 73.0 in | Wt 200.0 lb

## 2017-06-26 DIAGNOSIS — R21 Rash and other nonspecific skin eruption: Secondary | ICD-10-CM | POA: Diagnosis not present

## 2017-06-26 DIAGNOSIS — Z23 Encounter for immunization: Secondary | ICD-10-CM

## 2017-06-26 DIAGNOSIS — J329 Chronic sinusitis, unspecified: Secondary | ICD-10-CM

## 2017-06-26 LAB — POCT SKIN KOH
Skin KOH, POC: NEGATIVE
Skin KOH, POC: NEGATIVE

## 2017-06-26 MED ORDER — GUAIFENESIN ER 1200 MG PO TB12: 1.0000 | ORAL_TABLET | Freq: Two times a day (BID) | ORAL | 1 refills | Status: DC | PRN

## 2017-06-26 MED ORDER — FLUTICASONE PROPIONATE 50 MCG/ACT NA SUSP: 2.0000 | Freq: Every day | NASAL | 1 refills | Status: DC

## 2017-06-26 MED ORDER — ZINC OXIDE 11.3 % EX CREA: 1.0000 "application " | TOPICAL_CREAM | Freq: Two times a day (BID) | CUTANEOUS | 0 refills | Status: DC

## 2017-06-26 MED ORDER — FLUCONAZOLE 150 MG PO TABS: 150.0000 mg | ORAL_TABLET | ORAL | 0 refills | Status: DC

## 2017-06-26 NOTE — Progress Notes (Signed)
PRIMARY CARE AT [facility] [address] [place] [address] [phone]  Date:  06/26/2017   Name:  Alan Brooks   DOB:  11-Apr-1976   MRN:  [id number]  PCP:  Shirline Frees, NP    History of Present Illness:  Alan Brooks is a 41 y.o. male patient who presents to PCP with  Chief Complaint  Patient presents with  . Sinusitis    congestion/ green mucus  . Anal Itching  . Rash    head of penis/ white film around the area     Sinus: 5 days ago, he was having headache, itching, and pndrainage.  He has some brown mucus that he is coughing up.  Nasal mucus is clear.  No sob or dyspnea.  No ear discomfort.  Mild sore throat.  He has taken nothing for his symptoms, but vitamin C.  No fever.  No facial pain.  Cough is non-productive.  He has had contact with sick co-workers.    Anal itching: years of pruritic anal.  Feels raw at night.  He scratches at night.  No pain with bowel movements.  No constipation.  No anal intercourse.  No abdominal pain.  No foreign travel.    PENIle rash: 3 weeks of .  After sexual activity the penis would get raw and swell.  He has noticed whitish material, and there was some bleeding at the joining area of the penile shaft and head of his penis.  No dysuria.  No pruritic changes.  Circumcised.  He has tried neosporin to no avail.  He had tried lotrimin which did not help.  He tried ciclopirox which burned.     Patient Active Problem List   Diagnosis Date Noted  . Rosacea, acne 09/27/2014  . Difficulty swallowing solids 09/27/2014  . Low back pain 09/27/2014  . Carpal tunnel syndrome of left wrist 01/08/2012  . SEBACEOUS CYST 02/07/2009  . Anxiety state 05/19/2008  . Asthma 05/19/2008    Past Medical History:  Diagnosis Date  . Allergy   . Anxiety   . Asthma     History reviewed. No pertinent surgical history.  Social History  Substance Use Topics  . Smoking status: Former Smoker    Packs/day: 1.00    Years: 15.00    Types:  Cigarettes  . Smokeless tobacco: Never Used  . Alcohol use No    Family History  Problem Relation Age of Onset  . Heart attack Maternal Grandfather   . Heart attack Paternal Grandfather   . Asthma Brother   . Depression Other        entire family   . Anxiety disorder Other        entire family     No Known Allergies  Medication list has been reviewed and updated.  Current Outpatient Prescriptions on File Prior to Visit  Medication Sig Dispense Refill  . BREO ELLIPTA 200-25 MCG/INH AEPB INHALE 1 PUFF BY MOUTH INTO LUNGS DAILY 60 each 11  . citalopram (CELEXA) 40 MG tablet TAKE 1 TABLET BY MOUTH ONCE DAILY 90 tablet 3  . pantoprazole (PROTONIX) 40 MG tablet TAKE 1 TABLET BY MOUTH ONCE DAILY BEFORE BREAKFAST 90 tablet 3  . VENTOLIN HFA 108 (90 Base) MCG/ACT inhaler INHALE 2 PUFFS BY MOUTH INTO THE LUNGS EVERY 6 HOURS AS NEEDED FOR WHEEZING OR SHORTNESS OF BREATH 18 g 0  . doxycycline (VIBRAMYCIN) 100 MG capsule Take 1 capsule (100 mg total) by mouth 2 (two) times daily. (Patient  not taking: Reported on 06/26/2017) 20 capsule 0   No current facility-administered medications on file prior to visit.     ROS ROS otherwise unremarkable unless listed above.  Physical Examination: BP 124/70   Pulse 83   Temp 98.8 F (37.1 C) (Oral)   Resp 16   Ht 6\' 1"  (1.854 m)   Wt 200 lb (90.7 kg)   SpO2 95%   BMI 26.39 kg/m  Ideal Body Weight: Weight in (lb) to have BMI = 25: 189.1  Physical Exam  Constitutional: He is oriented to person, place, and time. He appears well-developed and well-nourished. No distress.  HENT:  Head: Normocephalic and atraumatic.  Right Ear: Tympanic membrane, external ear and ear canal normal.  Left Ear: Tympanic membrane, external ear and ear canal normal.  Nose: Mucosal edema and rhinorrhea present. Right sinus exhibits no maxillary sinus tenderness and no frontal sinus tenderness. Left sinus exhibits no maxillary sinus tenderness and no frontal sinus  tenderness.  Mouth/Throat: No uvula swelling. No oropharyngeal exudate, posterior oropharyngeal edema or posterior oropharyngeal erythema.  Eyes: Conjunctivae, EOM and lids are normal. Pupils are equal, round, and reactive to light. Right eye exhibits normal extraocular motion. Left eye exhibits normal extraocular motion.  Neck: Trachea normal and full passive range of motion without pain. No edema and no erythema present.  Cardiovascular: Normal rate.  Pulmonary/Chest: Effort normal. No respiratory distress. He has no decreased breath sounds. He has no wheezes. He has no rhonchi.  Genitourinary:  Genitourinary Comments: White material along the corona of the the penis.  This is non-tender.  No erythema. Gluteal cleft has mild erythema and fissure present superiorly at the cleft.   Mildly tender.   Neurological: He is alert and oriented to person, place, and time.  Skin: Skin is warm and dry. He is not diaphoretic.  Psychiatric: He has a normal mood and affect. His behavior is normal.     Results for orders placed or performed in visit on 06/26/17  POCT Skin KOH  Result Value Ref Range   Skin KOH, POC Negative Negative  POCT Skin KOH  Result Value Ref Range   Skin KOH, POC Negative Negative     Assessment and Plan: Alan Brooks is a 41 y.o. male who is here today for cc of  Chief Complaint  Patient presents with  . Sinusitis    congestion/ green mucus  . Anal Itching  . Rash    head of penis/ white film around the area  sinus appears viral in symptoms and longevity.  Supportive treatment given The rashes may be related.  I am giving zinc oxide to dry this area.  Also advised to take an anitfungal weekly for 3 weeks.  He will then return at that time for recheck.  Rhinosinusitis - Plan: Guaifenesin (MUCINEX MAXIMUM STRENGTH) 1200 MG TB12, fluticasone (FLONASE) 50 MCG/ACT nasal spray  Penile rash - Plan: POCT Skin KOH, zinc oxide (BALMEX) 11.3 % CREA cream, fluconazole  (DIFLUCAN) 150 MG tablet  Rash - Plan: POCT Skin KOH, zinc oxide (BALMEX) 11.3 % CREA cream, fluconazole (DIFLUCAN) 150 MG tablet  Need for influenza vaccination - Plan: Flu Vaccine QUAD 36+ mos IM  Trena PlattStephanie Nilesh Stegall, PA-C [facility] 11/6/20189:56 AM

## 2017-06-26 NOTE — Patient Instructions (Addendum)
Please apply the zinc oxide to the buttock area.   You can take the diflucan for the rash.  Take it weekly consecutively.       IF you received an x-ray today, you will receive an invoice from [facility]. Please contact [facility] at [phone] with questions or concerns regarding your invoice.   IF you received labwork today, you will receive an invoice from [facility]. Please contact [facility] at [phone] with questions or concerns regarding your invoice.   Our billing staff will not be able to assist you with questions regarding bills from these companies.  You will be contacted with the lab results as soon as they are available. The fastest way to get your results is to activate your My Chart account. Instructions are located on the last page of this paperwork. If you have not heard from us regarding the results in 2 weeks, please contact this office.

## 2017-07-17 MED FILL — CITALOPRAM HBR 40 MG TABLET: 40 | 90 days supply | Qty: 90 | Fill #3

## 2017-07-17 MED FILL — PANTOPRAZOLE SOD DR 40 MG T: 40 | 90 days supply | Qty: 90 | Fill #2

## 2017-07-17 MED FILL — BREO ELLIPTA 200-25 MCG INH: 200-25 | 30 days supply | Qty: 60 | Fill #10

## 2017-07-20 ENCOUNTER — Encounter: Payer: Self-pay | Admitting: Physician Assistant

## 2017-07-20 ENCOUNTER — Other Ambulatory Visit: Payer: Self-pay

## 2017-07-20 ENCOUNTER — Ambulatory Visit: Payer: BLUE CROSS/BLUE SHIELD | Admitting: Physician Assistant

## 2017-07-20 VITALS — BP 121/64 | HR 98 | Temp 98.3°F | Resp 16 | Ht 73.0 in | Wt 201.0 lb

## 2017-07-20 DIAGNOSIS — M79675 Pain in left toe(s): Secondary | ICD-10-CM | POA: Diagnosis not present

## 2017-07-20 DIAGNOSIS — R21 Rash and other nonspecific skin eruption: Secondary | ICD-10-CM

## 2017-07-20 MED ORDER — TERBINAFINE HCL 250 MG PO TABS: 250.0000 mg | ORAL_TABLET | Freq: Every day | ORAL | 0 refills | Status: DC

## 2017-07-20 MED ORDER — NYSTATIN 100000 UNIT/GM EX CREA: 1.0000 "application " | TOPICAL_CREAM | Freq: Two times a day (BID) | CUTANEOUS | 0 refills | Status: DC

## 2017-07-20 MED FILL — TERBINAFINE HCL 250 MG TAB: 250 | 28 days supply | Qty: 28 | Fill #0

## 2017-07-20 MED FILL — NYSTATIN 100,000 UNIT/GM CR: 100000 | 10 days supply | Qty: 30 | Fill #0

## 2017-07-20 NOTE — Progress Notes (Signed)
[facility] AT [facility] [address], [place] [id number] [phone]  Date:  07/20/2017   Name:  Alan BussingMichael J Brooks   DOB:  1976-04-19   MRN:  [id number]  PCP:  Shirline FreesNafziger, Cory, NP    History of Present Illness:  Alan Brooks is a 41 y.o. male patient who presents to PCP with  Chief Complaint  Patient presents with  . Follow-up    rhinosinusitis and penile rash. sinus is better rash is not.  . Toe Pain    left big toe     Patient reports that he continues to have the penile rash along the ridge of penis.  It is mildly irritated.  He did not think the diflucan had helped much.  He notes that there is a film which can wash off in the shower.  He will draw the skin back and clean, but this comes back.    Toe pain of left pain aggravated with jumping rope and kicking.  He has no swelling.  He does this without socks.  No redness that he knows.  He does do Location managerkick boxing.   He has no history of trauma.    Patient Active Problem List   Diagnosis Date Noted  . Rosacea, acne 09/27/2014  . Difficulty swallowing solids 09/27/2014  . Low back pain 09/27/2014  . Carpal tunnel syndrome of left wrist 01/08/2012  . SEBACEOUS CYST 02/07/2009  . Anxiety state 05/19/2008  . Asthma 05/19/2008    Past Medical History:  Diagnosis Date  . Allergy   . Anxiety   . Asthma     History reviewed. No pertinent surgical history.  Social History   Tobacco Use  . Smoking status: Former Smoker    Packs/day: 1.00    Years: 15.00    Pack years: 15.00    Types: Cigarettes  . Smokeless tobacco: Never Used  Substance Use Topics  . Alcohol use: No  . Drug use: No    Family History  Problem Relation Age of Onset  . Heart attack Maternal Grandfather   . Heart attack Paternal Grandfather   . Asthma Brother   . Depression Other        entire family   . Anxiety disorder Other        entire family     No Known Allergies  Medication list has been reviewed and updated.  Current  Outpatient Medications on File Prior to Visit  Medication Sig Dispense Refill  . BREO ELLIPTA 200-25 MCG/INH AEPB INHALE 1 PUFF BY MOUTH INTO LUNGS DAILY 60 each 11  . citalopram (CELEXA) 40 MG tablet TAKE 1 TABLET BY MOUTH ONCE DAILY 90 tablet 3  . doxycycline (VIBRAMYCIN) 100 MG capsule Take 1 capsule (100 mg total) by mouth 2 (two) times daily. (Patient not taking: Reported on 06/26/2017) 20 capsule 0  . fluconazole (DIFLUCAN) 150 MG tablet Take 1 tablet (150 mg total) by mouth once a week. Repeat if needed (Patient not taking: Reported on 07/20/2017) 3 tablet 0  . fluticasone (FLONASE) 50 MCG/ACT nasal spray Place 2 sprays into both nostrils daily. 16 g 1  . Guaifenesin (MUCINEX MAXIMUM STRENGTH) 1200 MG TB12 Take 1 tablet (1,200 mg total) by mouth every 12 (twelve) hours as needed. 14 tablet 1  . pantoprazole (PROTONIX) 40 MG tablet TAKE 1 TABLET BY MOUTH ONCE DAILY BEFORE BREAKFAST 90 tablet 3  . VENTOLIN HFA 108 (90 Base) MCG/ACT inhaler INHALE 2 PUFFS BY MOUTH INTO THE LUNGS EVERY 6  HOURS AS NEEDED FOR WHEEZING OR SHORTNESS OF BREATH 18 g 0  . zinc oxide (BALMEX) 11.3 % CREA cream Apply 1 application topically 2 (two) times daily. 85 g 0   No current facility-administered medications on file prior to visit.     ROS ROS otherwise unremarkable unless listed above.  Physical Examination: BP 121/64   Pulse 98   Temp 98.3 F (36.8 C) (Oral)   Resp 16   Ht 6\' 1"  (1.854 m)   Wt 201 lb (91.2 kg)   SpO2 98%   BMI 26.52 kg/m  Ideal Body Weight: Weight in (lb) to have BMI = 25: 189.1  Physical Exam  Constitutional: He is oriented to person, place, and time. He appears well-developed and well-nourished. No distress.  HENT:  Head: Normocephalic and atraumatic.  Eyes: Conjunctivae and EOM are normal. Pupils are equal, round, and reactive to light.  Cardiovascular: Normal rate.  Pulmonary/Chest: Effort normal. No respiratory distress.  Musculoskeletal:  Left toe swelling and tenderness  at the plantar area of the 1st metatarsal.  No erythema or swelling.  Normal rom  Neurological: He is alert and oriented to person, place, and time.  Skin: Skin is warm and dry. He is not diaphoretic.  Erythematous patch along the penile shaft and head.  White film at the coronal site once the penis is drawn back.   Psychiatric: He has a normal mood and affect. His behavior is normal.     Assessment and Plan: Alan Brooks is a 41 y.o. male who is here today for cc of  Chief Complaint  Patient presents with  . Follow-up    rhinosinusitis and penile rash. sinus is better rash is not.  . Toe Pain    left big toe  advised icing of the toe, and ibuprofen.  No improvement in 2 weeks, will image with xray Penis--start terbinafine for rash.  Nystatin topically.   Penile rash - Plan: nystatin cream (MYCOSTATIN), DISCONTINUED: nystatin cream (MYCOSTATIN)  Pain in left toe(s)  Trena PlattStephanie English, PA-C [facility] 11/26/20186:45 PM

## 2017-07-20 NOTE — Patient Instructions (Addendum)
Please ice the left toe three times per day for 15 minutes.  You can take ibuprofen 600mg  every 8 hours as needed.  Please take the terbinafine as prescribed. Terbinafine tablets What is this medicine? TERBINAFINE (TER bin a feen) is an antifungal medicine. It is used to treat certain kinds of fungal or yeast infections. This medicine may be used for other purposes; ask your health care provider or pharmacist if you have questions. COMMON BRAND NAME(S): Lamisil, Terbinex What should I tell my health care provider before I take this medicine? They need to know if you have any of these conditions: -drink alcoholic beverages -kidney disease -liver disease -an unusual or allergic reaction to terbinafine, other medicines, foods, dyes, or preservatives -pregnant or trying to get pregnant -breast-feeding How should I use this medicine? Take this medicine by mouth with a full glass of water. Follow the directions on the prescription label. You can take this medicine with food or on an empty stomach. Take your medicine at regular intervals. Do not take your medicine more often than directed. Do not skip doses or stop your medicine early even if you feel better. Do not stop taking except on your doctor's advice. Talk to your pediatrician regarding the use of this medicine in children. Special care may be needed. Overdosage: If you think you have taken too much of this medicine contact a poison control center or emergency room at once. NOTE: This medicine is only for you. Do not share this medicine with others. What if I miss a dose? If you miss a dose, take it as soon as you can. If it is almost time for your next dose, take only that dose. Do not take double or extra doses. What may interact with this medicine? Do not take this medicine with any of the following medications: -thioridazine This medicine may also interact with the following  medications: -beta-blockers -caffeine -cimetidine -cyclosporine -medicines for depression, anxiety, or psychotic disturbances -medicines for fungal infections like fluconazole and ketoconazole -medicines for irregular heartbeat like amiodarone, flecainide and propafenone -rifampin -warfarin This list may not describe all possible interactions. Give your health care provider a list of all the medicines, herbs, non-prescription drugs, or dietary supplements you use. Also tell them if you smoke, drink alcohol, or use illegal drugs. Some items may interact with your medicine. What should I watch for while using this medicine? Visit your doctor or health care provider regularly. Tell your doctor right away if you have nausea or vomiting, loss of appetite, stomach pain on your right upper side, yellow skin, dark urine, light stools, or are over tired. Some fungal infections need many weeks or months of treatment to cure. If you are taking this medicine for a long time, you will need to have important blood work done. What side effects may I notice from receiving this medicine? Side effects that you should report to your doctor or health care professional as soon as possible: -allergic reactions like skin rash or hives, swelling of the face, lips, or tongue -changes in vision -dark urine -fever or infection -general ill feeling or flu-like symptoms -light-colored stools -loss of appetite, nausea -redness, blistering, peeling or loosening of the skin, including inside the mouth -right upper belly pain -unusually weak or tired -yellowing of the eyes or skin Side effects that usually do not require medical attention (report to your doctor or health care professional if they continue or are bothersome): -changes in taste -diarrhea -hair loss -muscle or joint pain -  stomach gas -stomach upset This list may not describe all possible side effects. Call your doctor for medical advice about side  effects. You may report side effects to FDA at 1-800-FDA-1088. Where should I keep my medicine? Keep out of the reach of children. Store at room temperature below 25 degrees C (77 degrees F). Protect from light. Throw away any unused medicine after the expiration date. NOTE: This sheet is a summary. It may not cover all possible information. If you have questions about this medicine, talk to your doctor, pharmacist, or health care provider.  2018 Elsevier/Gold Standard (2007-10-22 16:28:07)        IF you received an x-ray today, you will receive an invoice from [facility]. Please contact [facility] at [phone] with questions or concerns regarding your invoice.   IF you received labwork today, you will receive an invoice from [facility]. Please contact [facility] at [phone] with questions or concerns regarding your invoice.   Our billing staff will not be able to assist you with questions regarding bills from these companies.  You will be contacted with the lab results as soon as they are available. The fastest way to get your results is to activate your My Chart account. Instructions are located on the last page of this paperwork. If you have not heard from us regarding the results in 2 weeks, please contact this office.

## 2017-07-30 DIAGNOSIS — M79675 Pain in left toe(s): Secondary | ICD-10-CM | POA: Diagnosis not present

## 2017-07-30 MED FILL — MELOXICAM 15 MG TABLET: 15 | 30 days supply | Qty: 30 | Fill #0

## 2017-08-01 ENCOUNTER — Ambulatory Visit: Payer: BLUE CROSS/BLUE SHIELD | Admitting: Physician Assistant

## 2017-08-01 ENCOUNTER — Encounter: Payer: Self-pay | Admitting: Physician Assistant

## 2017-08-01 VITALS — BP 123/70 | HR 83 | Temp 98.3°F | Resp 16 | Ht 73.0 in | Wt 204.0 lb

## 2017-08-01 DIAGNOSIS — R21 Rash and other nonspecific skin eruption: Secondary | ICD-10-CM

## 2017-08-01 MED ORDER — HYDROCORTISONE 1 % EX CREA: 1.0000 "application " | TOPICAL_CREAM | Freq: Two times a day (BID) | CUTANEOUS | 0 refills | Status: DC

## 2017-08-01 NOTE — Progress Notes (Signed)
[facility] [address], [place] [phone]  Date:  08/01/2017   Name:  Alan Brooks   DOB:  Jun 16, 1976   MRN:  [id number]  PCP:  Shirline FreesNafziger, Cory, NP    History of Present Illness:  Alan Brooks is a 41 y.o. male patient who presents to PCP with  Chief Complaint  Patient presents with  . Rash    penis rash; no change  . Follow-up     He is here today for penile rash follow up.  This is unchanged.  No symptoms of hematuria, dysuria, or penile discharge.  He still has  The film that appears at the head of penis.  He states it continues to be pruritic and painful.  He has no sexual activity at this time.   He is currently taking the terbinafine over the last 2 weeks.  He notes no change.  He also continues to have anal itching.  Patient Active Problem List   Diagnosis Date Noted  . Rosacea, acne 09/27/2014  . Difficulty swallowing solids 09/27/2014  . Low back pain 09/27/2014  . Carpal tunnel syndrome of left wrist 01/08/2012  . SEBACEOUS CYST 02/07/2009  . Anxiety state 05/19/2008  . Asthma 05/19/2008    Past Medical History:  Diagnosis Date  . Allergy   . Anxiety   . Asthma     No past surgical history on file.  Social History   Tobacco Use  . Smoking status: Former Smoker    Packs/day: 1.00    Years: 15.00    Pack years: 15.00    Types: Cigarettes  . Smokeless tobacco: Never Used  Substance Use Topics  . Alcohol use: No  . Drug use: No    Family History  Problem Relation Age of Onset  . Heart attack Maternal Grandfather   . Heart attack Paternal Grandfather   . Asthma Brother   . Depression Other        entire family   . Anxiety disorder Other        entire family     No Known Allergies  Medication list has been reviewed and updated.  Current Outpatient Medications on File Prior to Visit  Medication Sig Dispense Refill  . BREO ELLIPTA 200-25 MCG/INH AEPB INHALE 1 PUFF BY MOUTH INTO LUNGS DAILY 60 each 11   . citalopram (CELEXA) 40 MG tablet TAKE 1 TABLET BY MOUTH ONCE DAILY 90 tablet 3  . fluticasone (FLONASE) 50 MCG/ACT nasal spray Place 2 sprays into both nostrils daily. 16 g 1  . Guaifenesin (MUCINEX MAXIMUM STRENGTH) 1200 MG TB12 Take 1 tablet (1,200 mg total) by mouth every 12 (twelve) hours as needed. 14 tablet 1  . nystatin cream (MYCOSTATIN) Apply 1 application topically 2 (two) times daily. 30 g 0  . pantoprazole (PROTONIX) 40 MG tablet TAKE 1 TABLET BY MOUTH ONCE DAILY BEFORE BREAKFAST 90 tablet 3  . terbinafine (LAMISIL) 250 MG tablet Take 1 tablet (250 mg total) by mouth daily. 28 tablet 0  . VENTOLIN HFA 108 (90 Base) MCG/ACT inhaler INHALE 2 PUFFS BY MOUTH INTO THE LUNGS EVERY 6 HOURS AS NEEDED FOR WHEEZING OR SHORTNESS OF BREATH 18 g 0  . zinc oxide (BALMEX) 11.3 % CREA cream Apply 1 application topically 2 (two) times daily. 85 g 0  . doxycycline (VIBRAMYCIN) 100 MG capsule Take 1 capsule (100 mg total) by mouth 2 (two) times daily. (Patient not taking: Reported on 06/26/2017) 20 capsule 0  .  fluconazole (DIFLUCAN) 150 MG tablet Take 1 tablet (150 mg total) by mouth once a week. Repeat if needed (Patient not taking: Reported on 07/20/2017) 3 tablet 0   No current facility-administered medications on file prior to visit.     ROS ROS otherwise unremarkable unless listed above.  Physical Examination: BP 123/70   Pulse 83   Temp 98.3 F (36.8 C) (Oral)   Resp 16   Ht 6\' 1"  (1.854 m)   Wt 204 lb (92.5 kg)   SpO2 96%   BMI 26.91 kg/m  Ideal Body Weight: Weight in (lb) to have BMI = 25: 189.1  Physical Exam  Constitutional: He is oriented to person, place, and time. He appears well-developed and well-nourished. No distress.  HENT:  Head: Normocephalic and atraumatic.  Eyes: Conjunctivae and EOM are normal. Pupils are equal, round, and reactive to light.  Cardiovascular: Normal rate. Exam reveals no friction rub.  No murmur heard. Pulmonary/Chest: Effort normal. No  respiratory distress.  Genitourinary:  Genitourinary Comments: Coronal erythema with one circular lesion that is along the head extending distally at the shaft about 1cm in diameter.   2 annular mildly erythematous patching  At the right side of the scrotal area.  Non-tender or open.  Neurological: He is alert and oriented to person, place, and time.  Skin: Skin is warm and dry. He is not diaphoretic.  Psychiatric: He has a normal mood and affect. His behavior is normal.     Assessment and Plan: Alan Brooks is a 41 y.o. male who is here today for cc of  Chief Complaint  Patient presents with  . Rash    penis rash; no change  . Follow-up   We will do an otc potent hydrocortisone.  Adding rpr at this time.   Penile rash - Plan: hydrocortisone cream 1 %, RPR, Ambulatory referral to Dermatology  Trena PlattStephanie Klaryssa Fauth, PA-C [facility] 12/10/20189:36 AM

## 2017-08-01 NOTE — Patient Instructions (Addendum)
We will try hydrocortisone, so we are treating one step at a time Continue the terbinafine at this time.   I will contact you regarding the blood work.      IF you received an x-ray today, you will receive an invoice from [facility]. Please contact [facility] at [phone] with questions or concerns regarding your invoice.   IF you received labwork today, you will receive an invoice from [facility]. Please contact [facility] at [phone] with questions or concerns regarding your invoice.   Our billing staff will not be able to assist you with questions regarding bills from these companies.  You will be contacted with the lab results as soon as they are available. The fastest way to get your results is to activate your My Chart account. Instructions are located on the last page of this paperwork. If you have not heard from us regarding the results in 2 weeks, please contact this office.

## 2017-08-02 LAB — RPR: RPR Ser Ql: NONREACTIVE

## 2017-08-03 ENCOUNTER — Ambulatory Visit: Payer: BLUE CROSS/BLUE SHIELD | Admitting: Physician Assistant

## 2017-08-05 ENCOUNTER — Ambulatory Visit: Payer: BLUE CROSS/BLUE SHIELD | Admitting: Physician Assistant

## 2017-08-05 ENCOUNTER — Encounter: Payer: Self-pay | Admitting: Physician Assistant

## 2017-08-05 ENCOUNTER — Other Ambulatory Visit: Payer: Self-pay

## 2017-08-05 ENCOUNTER — Ambulatory Visit (INDEPENDENT_AMBULATORY_CARE_PROVIDER_SITE_OTHER): Payer: BLUE CROSS/BLUE SHIELD

## 2017-08-05 VITALS — BP 110/58 | HR 71 | Temp 96.8°F | Wt 207.0 lb

## 2017-08-05 DIAGNOSIS — R0781 Pleurodynia: Secondary | ICD-10-CM

## 2017-08-05 DIAGNOSIS — S20212A Contusion of left front wall of thorax, initial encounter: Secondary | ICD-10-CM | POA: Diagnosis not present

## 2017-08-05 DIAGNOSIS — R0602 Shortness of breath: Secondary | ICD-10-CM | POA: Diagnosis not present
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
## 2017-08-05 NOTE — Patient Instructions (Addendum)
This appears to be a contusion and a fracture could not be seen today. Please ice this area three times per day for 15 minutes. I would like you to try taking ibuprofen 600mg  every 6 hours as needed for pain.  Take with food on the belly.  Rib Contusion A rib contusion is a deep bruise on your rib area. Contusions are the result of a blunt trauma that causes bleeding and injury to the tissues under the skin. A rib contusion may involve bruising of the ribs and of the skin and muscles in the area. The skin overlying the contusion may turn blue, purple, or yellow. Minor injuries will give you a painless contusion, but more severe contusions may stay painful and swollen for a few weeks. What are the causes? A contusion is usually caused by a blow, trauma, or direct force to an area of the body. This often occurs while playing contact sports. What are the signs or symptoms?  Swelling and redness of the injured area.  Discoloration of the injured area.  Tenderness and soreness of the injured area.  Pain with or without movement. How is this diagnosed? The diagnosis can be made by taking a medical history and performing a physical exam. An X-ray, CT scan, or MRI may be needed to determine if there were any associated injuries, such as broken bones (fractures) or internal injuries. How is this treated? Often, the best treatment for a rib contusion is rest. Icing or applying cold compresses to the injured area may help reduce swelling and inflammation. Deep breathing exercises may be recommended to reduce the risk of partial lung collapse and pneumonia. Over-the-counter or prescription medicines may also be recommended for pain control. Follow these instructions at home:  Apply ice to the injured area: ? Put ice in a plastic bag. ? Place a towel between your skin and the bag. ? Leave the ice on for 20 minutes, 2-3 times per day.  Take medicines only as directed by your health care  provider.  Rest the injured area. Avoid strenuous activity and any activities or movements that cause pain. Be careful during activities and avoid bumping the injured area.  Perform deep-breathing exercises as directed by your health care provider.  Do not lift anything that is heavier than 5 lb (2.3 kg) until your health care provider approves.  Do not use any tobacco products, including cigarettes, chewing tobacco, or electronic cigarettes. If you need help quitting, ask your health care provider. Contact a health care provider if:  You have increased bruising or swelling.  You have pain that is not controlled with treatment.  You have a fever. Get help right away if:  You have difficulty breathing or shortness of breath.  You develop a continual cough, or you cough up thick or bloody sputum.  You feel sick to your stomach (nauseous), you throw up (vomit), or you have abdominal pain. This information is not intended to replace advice given to you by your health care provider. Make sure you discuss any questions you have with your health care provider. Document Released: 05/06/2001 Document Revised: 01/17/2016 Document Reviewed: 05/23/2014 Elsevier Interactive Patient Education  2018 [employer]     IF you received an x-ray today, you will receive an invoice from [facility]. Please contact [facility] at [phone] with questions or concerns regarding your invoice.   IF you received labwork today, you will receive an invoice from [facility]. Please contact [facility] at [phone] with questions or concerns  regarding your invoice.   Our billing staff will not be able to assist you with questions regarding bills from these companies.  You will be contacted with the lab results as soon as they are available. The fastest way to get your results is to activate your My Chart account. Instructions are located on the last page of this paperwork. If you have not  heard from us regarding the results in 2 weeks, please contact this office.

## 2017-08-07 DIAGNOSIS — L309 Dermatitis, unspecified: Secondary | ICD-10-CM | POA: Diagnosis not present

## 2017-08-07 DIAGNOSIS — L219 Seborrheic dermatitis, unspecified: Secondary | ICD-10-CM | POA: Diagnosis not present

## 2017-08-07 DIAGNOSIS — L209 Atopic dermatitis, unspecified: Secondary | ICD-10-CM | POA: Diagnosis not present

## 2017-08-19 MED FILL — BREO ELLIPTA 200-25 MCG INH: 200-25 | 30 days supply | Qty: 60 | Fill #11

## 2017-08-28 DIAGNOSIS — L219 Seborrheic dermatitis, unspecified: Secondary | ICD-10-CM | POA: Diagnosis not present

## 2017-08-28 DIAGNOSIS — A630 Anogenital (venereal) warts: Secondary | ICD-10-CM | POA: Diagnosis not present

## 2017-08-28 DIAGNOSIS — L309 Dermatitis, unspecified: Secondary | ICD-10-CM | POA: Diagnosis not present

## 2017-08-28 DIAGNOSIS — L209 Atopic dermatitis, unspecified: Secondary | ICD-10-CM | POA: Diagnosis not present

## 2017-09-17 ENCOUNTER — Other Ambulatory Visit: Payer: Self-pay | Admitting: Adult Health

## 2017-09-17 DIAGNOSIS — Z760 Encounter for issue of repeat prescription: Secondary | ICD-10-CM

## 2017-09-17 DIAGNOSIS — J4540 Moderate persistent asthma, uncomplicated: Secondary | ICD-10-CM

## 2017-09-18 MED FILL — BREO ELLIPTA 200-25 MCG INH: 200-25 | 30 days supply | Qty: 60 | Fill #0

## 2017-09-24 ENCOUNTER — Encounter: Payer: Self-pay | Admitting: Adult Health

## 2017-09-24 ENCOUNTER — Ambulatory Visit (INDEPENDENT_AMBULATORY_CARE_PROVIDER_SITE_OTHER): Payer: BLUE CROSS/BLUE SHIELD | Admitting: Adult Health

## 2017-09-24 VITALS — BP 122/70 | HR 80 | Temp 97.7°F | Resp 18 | Ht 73.0 in | Wt 205.6 lb

## 2017-09-24 DIAGNOSIS — Z760 Encounter for issue of repeat prescription: Secondary | ICD-10-CM | POA: Diagnosis not present

## 2017-09-24 DIAGNOSIS — F411 Generalized anxiety disorder: Secondary | ICD-10-CM

## 2017-09-24 DIAGNOSIS — K219 Gastro-esophageal reflux disease without esophagitis: Secondary | ICD-10-CM

## 2017-09-24 DIAGNOSIS — Z0000 Encounter for general adult medical examination without abnormal findings: Secondary | ICD-10-CM | POA: Diagnosis not present

## 2017-09-24 DIAGNOSIS — J4540 Moderate persistent asthma, uncomplicated: Secondary | ICD-10-CM

## 2017-09-24 MED ORDER — FLUTICASONE FUROATE-VILANTEROL 200-25 MCG/INH IN AEPB: 1.0000 | INHALATION_SPRAY | Freq: Every day | RESPIRATORY_TRACT | 6 refills | Status: DC

## 2017-09-24 NOTE — Progress Notes (Signed)
Subjective:    Patient ID: Alan Brooks, male    DOB: 08/13/1976, 42 y.o.   MRN: [id number]  HPI Patient presents for yearly preventative medicine examination. He is a pleasant 42 year old male who  has a past medical history of Allergy, Anxiety, and Asthma.  Anxiety is well controlled with Celexa 40mg .   GERD is controlled with protonix and diet  Asthma is controlled with Breo and Ventolin   All immunizations and health maintenance protocols were reviewed with the patient and needed orders were placed.  Appropriate screening laboratory values were ordered for the patient including screening of hyperlipidemia, renal function and hepatic function.  Medication reconciliation,  past medical history, social history, problem list and allergies were reviewed in detail with the patient  Goals were established with regard to weight loss, exercise, and  diet in compliance with medications. He continues to do marital arts and tries to eat healthy.   His only concern today is that of possible genital warts. He has been seen by dermatology and has been applying Imiquimod cream. He has a follow up in 4 days for possible biopsy.   Wt Readings from Last 3 Encounters:  09/24/17 205 lb 9 oz (93.2 kg)  08/05/17 207 lb (93.9 kg)  08/01/17 204 lb (92.5 kg)    Review of Systems  Constitutional: Negative.   HENT: Negative.   Eyes: Negative.   Respiratory: Negative.   Cardiovascular: Negative.   Gastrointestinal: Negative.   Endocrine: Negative.   Genitourinary: Negative.   Musculoskeletal: Negative.   Skin: Positive for rash and wound.  Allergic/Immunologic: Negative.   Neurological: Negative.   Hematological: Negative.   Psychiatric/Behavioral: Negative.   All other systems reviewed and are negative.  Past Medical History:  Diagnosis Date  . Allergy   . Anxiety   . Asthma     Social History   Socioeconomic History  . Marital status: Single    Spouse name: Not on file  .  Number of children: Not on file  . Years of education: Not on file  . Highest education level: Not on file  Social Needs  . Financial resource strain: Not on file  . Food insecurity - worry: Not on file  . Food insecurity - inability: Not on file  . Transportation needs - medical: Not on file  . Transportation needs - non-medical: Not on file  Occupational History  . Not on file  Tobacco Use  . Smoking status: Former Smoker    Packs/day: 1.00    Years: 15.00    Pack years: 15.00    Types: Cigarettes  . Smokeless tobacco: Never Used  Substance and Sexual Activity  . Alcohol use: No  . Drug use: No  . Sexual activity: Not on file  Other Topics Concern  . 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  Family History  Problem Relation Age of Onset  . Heart attack Maternal Grandfather   . Heart attack Paternal Grandfather   . Asthma Brother   . Depression Other        entire family   . Anxiety disorder Other        entire family     No Known Allergies  Current Outpatient Medications on File Prior to Visit  Medication Sig  Dispense Refill  . citalopram (CELEXA) 40 MG tablet TAKE 1 TABLET BY MOUTH ONCE DAILY 90 tablet 3  . fluconazole (DIFLUCAN) 150 MG tablet Take 1 tablet (150 mg total) by mouth once a week. Repeat if needed 3 tablet 0  . fluticasone (FLONASE) 50 MCG/ACT nasal spray Place 2 sprays into both nostrils daily. 16 g 1  . fluticasone furoate-vilanterol (BREO ELLIPTA) 200-25 MCG/INH AEPB Inhale 1 puff into the lungs daily. **NEEDS YEARLY PHYSICAL APPOINTMENT** 60 each 0  . hydrocortisone cream 1 % Apply 1 application topically 2 (two) times daily. 30 g 0  . pantoprazole (PROTONIX) 40 MG tablet TAKE 1 TABLET BY MOUTH ONCE DAILY BEFORE BREAKFAST 90 tablet 3  . terbinafine (LAMISIL) 250 MG tablet Take 1 tablet (250 mg total) by  mouth daily. 28 tablet 0  . VENTOLIN HFA 108 (90 Base) MCG/ACT inhaler INHALE 2 PUFFS BY MOUTH INTO THE LUNGS EVERY 6 HOURS AS NEEDED FOR WHEEZING OR SHORTNESS OF BREATH 18 g 0  . zinc oxide (BALMEX) 11.3 % CREA cream Apply 1 application topically 2 (two) times daily. (Patient not taking: Reported on 08/05/2017) 85 g 0   No current facility-administered medications on file prior to visit.     BP 122/70   Pulse 80   Temp 97.7 F (36.5 C)   Resp 18   Ht 6\' 1"  (1.854 m)   Wt 205 lb 9 oz (93.2 kg)   SpO2 96%   BMI 27.12 kg/m       Objective:   Physical Exam  Constitutional: He is oriented to person, place, and time. He appears well-developed and well-nourished. No distress.  HENT:  Head: Normocephalic and atraumatic.  Right Ear: External ear normal.  Left Ear: External ear normal.  Nose: Nose normal.  Mouth/Throat: Oropharynx is clear and moist. No oropharyngeal exudate.  Eyes: Conjunctivae and EOM are normal. Pupils are equal, round, and reactive to light. Right eye exhibits no discharge. Left eye exhibits no discharge. No scleral icterus.  Neck: Normal range of motion. Neck supple. No JVD present. No tracheal deviation present. No thyromegaly present.  Cardiovascular: Normal rate, regular rhythm, normal heart sounds and intact distal pulses. Exam reveals no gallop and no friction rub.  No murmur heard. Pulmonary/Chest: Effort normal and breath sounds normal. No stridor. No respiratory distress. He has no wheezes. He has no rales. He exhibits no tenderness.  Abdominal: Soft. Bowel sounds are normal. He exhibits no distension and no mass. There is no tenderness. There is no rebound and no guarding.  Musculoskeletal: Normal range of motion. He exhibits no edema, tenderness or deformity.  Lymphadenopathy:    He has no cervical adenopathy.  Neurological: He is alert and oriented to person, place, and time. He has normal reflexes. He displays normal reflexes. No cranial nerve deficit.  He exhibits normal muscle tone. Coordination normal.  Skin: Skin is warm and dry. No rash noted. He is not diaphoretic. No erythema. No pallor.  Red irritated skin around head of penis. This is likely from cream prescribed by dermatology. No warts seen  Psychiatric: He has a normal mood and affect. His behavior is normal. Judgment and thought content normal.  Nursing note and vitals reviewed.     Assessment & Plan:  1. Routine general medical examination at a health care facility  - Lipid panel; Future - TSH; Future - Hemoglobin A1c; Future - CBC with Differential/Platelet; Future - CMP; Future  2. Anxiety state - Well controlled on Celexa. No change  3. Gastroesophageal reflux disease without esophagitis - Well controlled with Protonix and diet   4. Medication refill  - fluticasone furoate-vilanterol (BREO ELLIPTA) 200-25 MCG/INH AEPB; Inhale 1 puff into the lungs daily.  Dispense: 60 each; Refill: 6  5. Asthma, chronic, moderate persistent, uncomplicated  - fluticasone furoate-vilanterol (BREO ELLIPTA) 200-25 MCG/INH AEPB; Inhale 1 puff into the lungs daily.  Dispense: 60 each; Refill: 6  Shirline Frees, NP

## 2017-09-25 LAB — CBC WITH DIFFERENTIAL/PLATELET
Basophils Absolute: 0 10*3/uL (ref 0.0–0.1)
Basophils Relative: 0.9 % (ref 0.0–3.0)
Eosinophils Absolute: 0.2 10*3/uL (ref 0.0–0.7)
Eosinophils Relative: 4.5 % (ref 0.0–5.0)
HCT: 46.2 % (ref 39.0–52.0)
Hemoglobin: 15.7 g/dL (ref 13.0–17.0)
Lymphocytes Relative: 30.7 % (ref 12.0–46.0)
Lymphs Abs: 1.7 10*3/uL (ref 0.7–4.0)
MCHC: 34 g/dL (ref 30.0–36.0)
MCV: 92.7 fl (ref 78.0–100.0)
Monocytes Absolute: 0.7 10*3/uL (ref 0.1–1.0)
Monocytes Relative: 12.2 % — ABNORMAL HIGH (ref 3.0–12.0)
Neutro Abs: 2.8 10*3/uL (ref 1.4–7.7)
Neutrophils Relative %: 51.7 % (ref 43.0–77.0)
Platelets: 196 10*3/uL (ref 150.0–400.0)
RBC: 4.99 Mil/uL (ref 4.22–5.81)
RDW: 13.9 % (ref 11.5–15.5)
WBC: 5.5 10*3/uL (ref 4.0–10.5)

## 2017-09-25 LAB — COMPREHENSIVE METABOLIC PANEL
ALT: 27 U/L (ref 0–53)
AST: 22 U/L (ref 0–37)
Albumin: 4.3 g/dL (ref 3.5–5.2)
Alkaline Phosphatase: 65 U/L (ref 39–117)
BUN: 15 mg/dL (ref 6–23)
CO2: 31 mEq/L (ref 19–32)
Calcium: 9.1 mg/dL (ref 8.4–10.5)
Chloride: 103 mEq/L (ref 96–112)
Creatinine, Ser: 1.13 mg/dL (ref 0.40–1.50)
GFR: 76.5 mL/min (ref >60.00)
Glucose, Bld: 85 mg/dL (ref 70–99)
Potassium: 4.2 mEq/L (ref 3.5–5.1)
Sodium: 140 mEq/L (ref 135–145)
Total Bilirubin: 0.8 mg/dL (ref 0.2–1.2)
Total Protein: 6.7 g/dL (ref 6.0–8.3)

## 2017-09-25 LAB — HEMOGLOBIN A1C: Hgb A1c MFr Bld: 5.7 % (ref 4.6–6.5)

## 2017-09-25 LAB — LIPID PANEL
Cholesterol: 178 mg/dL (ref 0–200)
HDL: 49.9 mg/dL (ref >39.00)
LDL Cholesterol: 118 mg/dL — ABNORMAL HIGH (ref 0–99)
NonHDL: 128.2
Total CHOL/HDL Ratio: 4
Triglycerides: 53 mg/dL (ref 0.0–149.0)
VLDL: 10.6 mg/dL (ref 0.0–40.0)

## 2017-09-25 LAB — TSH: TSH: 1.58 u[IU]/mL (ref 0.35–4.50)

## 2017-09-25 NOTE — Addendum Note (Signed)
Addended by: Bonnye FavaKWEI, NANA K on: 09/25/2017 07:53 AM   Modules accepted: Orders

## 2017-09-25 NOTE — Progress Notes (Signed)
Left message on voicemail to call office.  

## 2017-09-28 ENCOUNTER — Encounter: Payer: Self-pay | Admitting: Family Medicine

## 2017-09-30 DIAGNOSIS — L219 Seborrheic dermatitis, unspecified: Secondary | ICD-10-CM | POA: Diagnosis not present

## 2017-09-30 DIAGNOSIS — A630 Anogenital (venereal) warts: Secondary | ICD-10-CM | POA: Diagnosis not present

## 2017-09-30 DIAGNOSIS — L209 Atopic dermatitis, unspecified: Secondary | ICD-10-CM | POA: Diagnosis not present

## 2017-09-30 DIAGNOSIS — L309 Dermatitis, unspecified: Secondary | ICD-10-CM | POA: Diagnosis not present

## 2017-10-08 DIAGNOSIS — K625 Hemorrhage of anus and rectum: Secondary | ICD-10-CM | POA: Diagnosis not present

## 2017-10-10 ENCOUNTER — Encounter: Payer: Self-pay | Admitting: Physician Assistant

## 2017-10-10 DIAGNOSIS — L209 Atopic dermatitis, unspecified: Secondary | ICD-10-CM | POA: Insufficient documentation

## 2017-10-10 DIAGNOSIS — A630 Anogenital (venereal) warts: Secondary | ICD-10-CM | POA: Insufficient documentation

## 2017-10-10 DIAGNOSIS — L219 Seborrheic dermatitis, unspecified: Secondary | ICD-10-CM | POA: Insufficient documentation

## 2017-10-19 ENCOUNTER — Other Ambulatory Visit: Payer: Self-pay | Admitting: Adult Health

## 2017-10-19 MED FILL — BREO ELLIPTA 200-25 MCG INH: 200-25 | 30 days supply | Qty: 60 | Fill #0

## 2017-10-19 MED FILL — PANTOPRAZOLE SOD DR 40 MG T: 40 | 90 days supply | Qty: 90 | Fill #3

## 2017-10-21 MED FILL — CITALOPRAM HBR 40 MG TABLET: 40 | 90 days supply | Qty: 90 | Fill #0

## 2017-10-21 NOTE — Telephone Encounter (Signed)
Sent to the pharmacy by e-scribe. 

## 2017-10-22 ENCOUNTER — Other Ambulatory Visit: Payer: Self-pay | Admitting: Adult Health

## 2017-10-23 NOTE — Telephone Encounter (Signed)
DENIED.  FILLED 12/2016 FOR 1 YEAR.  REQUEST IS TOO EARLY.  MESSAGE SENT TO THE PHARMACY.

## 2017-10-28 DIAGNOSIS — L219 Seborrheic dermatitis, unspecified: Secondary | ICD-10-CM | POA: Diagnosis not present

## 2017-10-28 DIAGNOSIS — L309 Dermatitis, unspecified: Secondary | ICD-10-CM | POA: Diagnosis not present

## 2017-11-11 DIAGNOSIS — L918 Other hypertrophic disorders of the skin: Secondary | ICD-10-CM | POA: Diagnosis not present

## 2017-11-11 DIAGNOSIS — A630 Anogenital (venereal) warts: Secondary | ICD-10-CM | POA: Diagnosis not present

## 2017-11-11 DIAGNOSIS — L219 Seborrheic dermatitis, unspecified: Secondary | ICD-10-CM | POA: Diagnosis not present

## 2017-11-11 DIAGNOSIS — L249 Irritant contact dermatitis, unspecified cause: Secondary | ICD-10-CM | POA: Diagnosis not present

## 2017-11-16 MED FILL — BREO ELLIPTA 200-25 MCG INH: 200-25 | 30 days supply | Qty: 60 | Fill #1

## 2017-11-25 ENCOUNTER — Encounter: Payer: Self-pay | Admitting: Physician Assistant

## 2017-12-01 DIAGNOSIS — L814 Other melanin hyperpigmentation: Secondary | ICD-10-CM | POA: Diagnosis not present

## 2017-12-01 DIAGNOSIS — D225 Melanocytic nevi of trunk: Secondary | ICD-10-CM | POA: Diagnosis not present

## 2017-12-01 DIAGNOSIS — L218 Other seborrheic dermatitis: Secondary | ICD-10-CM | POA: Diagnosis not present

## 2017-12-01 DIAGNOSIS — D1801 Hemangioma of skin and subcutaneous tissue: Secondary | ICD-10-CM | POA: Diagnosis not present

## 2017-12-01 DIAGNOSIS — A630 Anogenital (venereal) warts: Secondary | ICD-10-CM | POA: Diagnosis not present

## 2017-12-01 MED FILL — CICLOPIROX 1% SHAMPOO: 1 | 30 days supply | Qty: 120 | Fill #0

## 2017-12-01 MED FILL — KETOCONAZOLE 2% CREAM: 2 | 30 days supply | Qty: 60 | Fill #0

## 2017-12-01 MED FILL — HYDROCORTISONE 2.5% CREAM: 2.5 | 3 days supply | Qty: 60 | Fill #0

## 2017-12-18 ENCOUNTER — Other Ambulatory Visit: Payer: Self-pay | Admitting: Adult Health

## 2017-12-18 MED FILL — VENTOLIN HFA 90 MCG INHALER: 108 (90 BAS | 25 days supply | Qty: 18 | Fill #0

## 2017-12-18 MED FILL — BREO ELLIPTA 200-25 MCG INH: 200-25 | 30 days supply | Qty: 60 | Fill #2

## 2017-12-31 DIAGNOSIS — B078 Other viral warts: Secondary | ICD-10-CM | POA: Diagnosis not present

## 2017-12-31 DIAGNOSIS — L218 Other seborrheic dermatitis: Secondary | ICD-10-CM | POA: Diagnosis not present

## 2017-12-31 MED FILL — FLUOCINONIDE 0.05 % SOLN: 0.05 | 30 days supply | Qty: 60 | Fill #0

## 2018-01-06 ENCOUNTER — Encounter: Payer: Self-pay | Admitting: Adult Health

## 2018-01-06 ENCOUNTER — Ambulatory Visit (INDEPENDENT_AMBULATORY_CARE_PROVIDER_SITE_OTHER)
Admission: RE | Admit: 2018-01-06 | Discharge: 2018-01-06 | Disposition: A | Discharge status: Home / Self Care | Payer: BLUE CROSS/BLUE SHIELD | Source: Ambulatory Visit | Attending: Adult Health | Admitting: Adult Health

## 2018-01-06 ENCOUNTER — Ambulatory Visit: Payer: BLUE CROSS/BLUE SHIELD | Admitting: Adult Health

## 2018-01-06 VITALS — BP 112/90 | Temp 98.1°F | Wt 202.0 lb

## 2018-01-06 DIAGNOSIS — M545 Low back pain, unspecified: Secondary | ICD-10-CM

## 2018-01-06 DIAGNOSIS — M546 Pain in thoracic spine: Secondary | ICD-10-CM | POA: Diagnosis not present

## 2018-01-06 DIAGNOSIS — M4184 Other forms of scoliosis, thoracic region: Secondary | ICD-10-CM | POA: Diagnosis not present

## 2018-01-06 NOTE — Progress Notes (Signed)
Subjective:    Patient ID: Alan Brooks, male    DOB: 1976-04-20, 42 y.o.   MRN: [id number]  HPI 42 year old male who  has a past medical history of Allergy, Anxiety, and Asthma. He presents to the office today with the complaint of 2 months of mid back pain. Pain started after he building a deck at his house. Pain was intermittent but has become more constant. His girlfriend was rubbing Beng[place] on his back last night and he noticed pain when she pushed on his spine. He denies any numbness or tingling in his lower extremities and has no loss of bowel or bladder. Report pain is worse with twisting movements. Has been able to participate in karate without difficulty.   Review of Systems See HPI   Past Medical History:  Diagnosis Date  . Allergy   . Anxiety   . Asthma     Social History   Socioeconomic History  . Marital status: Single    Spouse name: Not on file  . Number of children: Not on file  . Years of education: Not on file  . Highest education level: Not on file  Occupational History  . Not on file  Social Needs  . Financial resource strain: Not on file  . Food insecurity:    Worry: Not on file    Inability: Not on file  . Transportation needs:    Medical: Not on file    Non-medical: Not on file  Tobacco Use  . Smoking status: Former Smoker    Packs/day: 1.00    Years: 15.00    Pack years: 15.00    Types: Cigarettes  . Smokeless tobacco: Never Used  Substance and Sexual Activity  . Alcohol use: No  . Drug use: No  . Sexual activity: Not on file  Lifestyle  . Physical activity:    Days per week: Not on file    Minutes per session: Not on file  . Stress: Not on file  Relationships  . Social connections:    Talks on phone: Not on file    Gets together: Not on file    Attends religious service: Not on file    Active member of club or organization: Not on file    Attends meetings of clubs or organizations: Not on file    Relationship status: Not on  file  . Intimate partner violence:    Fear of current or ex partner: Not on file    Emotionally abused: Not on file    Physically abused: Not on file    Forced sexual activity: Not on file  Other Topics Concern  . 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 No pertinent surgical history.  Family History  Problem Relation Age of Onset  . Heart attack Maternal Grandfather   . Heart attack Paternal Grandfather   . Asthma Brother   . Depression Other        entire family   . Anxiety disorder Other        entire family     No Known Allergies  Current Outpatient Medications on File Prior to Visit  Medication Sig Dispense Refill  . citalopram (CELEXA) 40 MG tablet TAKE 1 TABLET BY MOUTH ONCE DAILY 90 tablet 2  . fluocinolone (SYNALAR) 0.025 %  ointment APPLY TO AFFECTED AREA OF THE GROIN TWICE DAILY FOR 2 WEEKS  0  . fluticasone furoate-vilanterol (BREO ELLIPTA) 200-25 MCG/INH AEPB Inhale 1 puff into the lungs daily. 60 each 6  . hydrocortisone cream 1 % Apply 1 application topically 2 (two) times daily. (Patient not taking: Reported on 09/24/2017) 30 g 0  . Imiquimod Pump 3.75 % CREA APPLY THIN COAT TO GROIN AT BEDTIME THEN WASH OFF IN THE MORNING  0  . pantoprazole (PROTONIX) 40 MG tablet TAKE 1 TABLET BY MOUTH ONCE DAILY BEFORE BREAKFAST 90 tablet 3  . VENTOLIN HFA 108 (90 Base) MCG/ACT inhaler INHALE 2 PUFFS BY MOUTH INTO THE LUNGS EVERY 6 HOURS AS NEEDED FOR WHEEZING OR SHORTNESS OF BREATH 18 g 0   No current facility-administered medications on file prior to visit.     BP 112/90   Temp 98.1 F (36.7 C) (Oral)   Wt 202 lb (91.6 kg)   BMI 26.65 kg/m       Objective:   Physical Exam  Constitutional: He appears well-developed and well-nourished. No distress.  Cardiovascular: Normal rate, regular rhythm, normal heart sounds and intact distal pulses.  Exam reveals no gallop and no friction rub.  No murmur heard. Pulmonary/Chest: Effort normal and breath sounds normal. No stridor. No respiratory distress. He has no wheezes. He has no rales. He exhibits no tenderness.  Musculoskeletal:       Thoracic back: He exhibits tenderness and bony tenderness. He exhibits normal range of motion, no edema, no deformity and no spasm.       Back:  Neurological: He is alert.  Skin: Skin is dry. Capillary refill takes less than 2 seconds. He is not diaphoretic.  Psychiatric: He has a normal mood and affect. His behavior is normal. Judgment and thought content normal.  Nursing note and vitals reviewed.     Assessment & Plan:   1. Thoracic spine pain - appears to be more skeletal pain then muscular. Will get xray and treat or refer accordingly.  - DG Thoracic Spine W/Swimmers; Future - Advised motrin for the time being  - Follow up if no improvement   Shirline Frees, NP

## 2018-01-07 ENCOUNTER — Telehealth: Payer: Self-pay | Admitting: Family Medicine

## 2018-01-07 ENCOUNTER — Other Ambulatory Visit: Payer: Self-pay | Admitting: Family Medicine

## 2018-01-07 MED ORDER — METHYLPREDNISOLONE 4 MG PO TBPK: ORAL_TABLET | ORAL | 0 refills | Status: DC

## 2018-01-07 MED FILL — METHYLPREDNISOLONE 4 MG TAB: 4 | 6 days supply | Qty: 21 | Fill #0

## 2018-01-07 NOTE — Telephone Encounter (Signed)
Noted  

## 2018-01-07 NOTE — Telephone Encounter (Signed)
Copied from CRM [id number]. Topic: Inquiry >> Jan 07, 2018  9:06 AM Windy Kalata, NT wrote: Reason for CRM: patient would like his xray results from 01/06/18.

## 2018-01-19 ENCOUNTER — Other Ambulatory Visit: Payer: Self-pay | Admitting: Adult Health

## 2018-01-19 MED FILL — CITALOPRAM HBR 40 MG TABLET: 40 | 90 days supply | Qty: 90 | Fill #1

## 2018-01-19 MED FILL — BREO ELLIPTA 200-25 MCG INH: 200-25 | 30 days supply | Qty: 60 | Fill #3

## 2018-01-20 MED FILL — PANTOPRAZOLE SOD DR 40 MG T: 40 | 90 days supply | Qty: 90 | Fill #0

## 2018-01-20 NOTE — Telephone Encounter (Signed)
Sent to the pharmacy by e-scribe. 

## 2018-01-22 MED FILL — CICLOPIROX 1% SHAMPOO: 1 | 30 days supply | Qty: 120 | Fill #1

## 2018-02-03 ENCOUNTER — Ambulatory Visit: Payer: BLUE CROSS/BLUE SHIELD | Admitting: Adult Health

## 2018-02-03 ENCOUNTER — Encounter: Payer: Self-pay | Admitting: Adult Health

## 2018-02-03 ENCOUNTER — Ambulatory Visit (INDEPENDENT_AMBULATORY_CARE_PROVIDER_SITE_OTHER)
Admission: RE | Admit: 2018-02-03 | Discharge: 2018-02-03 | Disposition: A | Discharge status: Home / Self Care | Payer: BLUE CROSS/BLUE SHIELD | Source: Ambulatory Visit | Attending: Adult Health | Admitting: Adult Health

## 2018-02-03 VITALS — BP 112/72 | Temp 98.3°F | Wt 207.0 lb

## 2018-02-03 DIAGNOSIS — M79671 Pain in right foot: Secondary | ICD-10-CM | POA: Diagnosis not present

## 2018-02-03 DIAGNOSIS — S99921A Unspecified injury of right foot, initial encounter: Secondary | ICD-10-CM | POA: Diagnosis not present

## 2018-02-03 NOTE — Progress Notes (Signed)
Subjective:    Patient ID: Alan Brooks, male    DOB: [birth year], 42 y.o.   MRN: [id number]  HPI  Presents to the office today for an acute complaint of right foot pain. Pain has been present for two weeks. Trauma include injury in martial arts injury just prior. Pain is located along the lateral aspect of the right foot. Reports pain is worse with weightbearing and with touch.  He has been using motrin with relief.    Review of Systems See HPI   Past Medical History:  Diagnosis Date  . Allergy   . Anxiety   . Asthma     Social History   Socioeconomic History  . Marital status: Single    Spouse name: Not on file  . Number of children: Not on file  . Years of education: Not on file  . Highest education level: Not on file  Occupational History  . Not on file  Social Needs  . Financial resource strain: Not on file  . Food insecurity:    Worry: Not on file    Inability: Not on file  . Transportation needs:    Medical: Not on file    Non-medical: Not on file  Tobacco Use  . Smoking status: Former Smoker    Packs/day: 1.00    Years: 15.00    Pack years: 15.00    Types: Cigarettes  . Smokeless tobacco: Never Used  Substance and Sexual Activity  . Alcohol use: No  . Drug use: No  . Sexual activity: Not on file  Lifestyle  . Physical activity:    Days per week: Not on file    Minutes per session: Not on file  . Stress: Not on file  Relationships  . Social connections:    Talks on phone: Not on file    Gets together: Not on file    Attends religious service: Not on file    Active member of club or organization: Not on file    Attends meetings of clubs or organizations: Not on file    Relationship status: Not on file  . Intimate partner violence:    Fear of current or ex partner: Not on file    Emotionally abused: Not on file    Physically abused: Not on file    Forced sexual activity: Not on file  Other Topics Concern  . 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 No pertinent surgical history.  Family History  Problem Relation Age of Onset  . Heart attack Maternal Grandfather   . Heart attack Paternal Grandfather   . Asthma Brother   . Depression Other        entire family   . Anxiety disorder Other        entire family     No Known Allergies  Current Outpatient Medications on File Prior to Visit  Medication Sig Dispense Refill  . citalopram (CELEXA) 40 MG tablet TAKE 1 TABLET BY MOUTH ONCE DAILY 90 tablet 2  . fluocinolone (SYNALAR) 0.025 % ointment APPLY TO AFFECTED AREA OF THE GROIN TWICE DAILY FOR 2 WEEKS  0  . fluticasone furoate-vilanterol (BREO ELLIPTA) 200-25 MCG/INH AEPB Inhale 1 puff into the lungs daily. 60 each 6  . hydrocortisone cream 1 % Apply 1 application topically 2 (  two) times daily. 30 g 0  . pantoprazole (PROTONIX) 40 MG tablet TAKE 1 TABLET BY MOUTH ONCE DAILY BEFORE BREAKFAST 90 tablet 2  . VENTOLIN HFA 108 (90 Base) MCG/ACT inhaler INHALE 2 PUFFS BY MOUTH INTO THE LUNGS EVERY 6 HOURS AS NEEDED FOR WHEEZING OR SHORTNESS OF BREATH 18 g 0   No current facility-administered medications on file prior to visit.     BP 112/72   Temp 98.3 F (36.8 C) (Oral)   Wt 207 lb (93.9 kg)   BMI 27.31 kg/m       Objective:   Physical Exam  Constitutional: He is oriented to person, place, and time. He appears well-developed and well-nourished. No distress.  Cardiovascular: Intact distal pulses.  Musculoskeletal: Normal range of motion. He exhibits tenderness.  Pain with palpation along lateral aspect of right foot. Trace soft tissue swelling noted along cuboid.   Walks with limping gait   Neurological: He is alert and oriented to person, place, and time.  Skin: Skin is warm and dry. Capillary refill takes less than 2 seconds. He is not diaphoretic.  Psychiatric: He has a normal mood  and affect. His behavior is normal. Judgment and thought content normal.  Vitals reviewed.     Assessment & Plan:  1. Right foot pain - slight concern for fracture due to nature of injury  - DG Foot Complete Right; Future - Hard sole shoe  - Motrin and ice   Shirline Frees, NP

## 2018-02-22 MED FILL — BREO ELLIPTA 200-25 MCG INH: 200-25 | 30 days supply | Qty: 60 | Fill #4

## 2018-02-23 ENCOUNTER — Telehealth: Payer: Self-pay | Admitting: *Deleted

## 2018-02-23 DIAGNOSIS — M79671 Pain in right foot: Secondary | ICD-10-CM

## 2018-02-23 NOTE — Telephone Encounter (Signed)
Pt had x-ray on 02/03/18.  Referral?

## 2018-02-23 NOTE — Telephone Encounter (Signed)
Copied from CRM [id number]. Topic: General - Other >> Feb 23, 2018  2:00 PM River PinesWilliams-Neal, Sade R wrote: Pt is calling requesting an ultrasound of right foot from previous inury

## 2018-02-26 NOTE — Telephone Encounter (Signed)
I did a referral to Podiatry  

## 2018-02-26 NOTE — Telephone Encounter (Signed)
I called the pt and informed him of the message below. 

## 2018-03-12 ENCOUNTER — Encounter: Payer: Self-pay | Admitting: Podiatry

## 2018-03-12 ENCOUNTER — Ambulatory Visit (INDEPENDENT_AMBULATORY_CARE_PROVIDER_SITE_OTHER): Payer: BLUE CROSS/BLUE SHIELD | Admitting: Podiatry

## 2018-03-12 ENCOUNTER — Ambulatory Visit: Payer: BLUE CROSS/BLUE SHIELD

## 2018-03-12 VITALS — BP 112/68 | HR 85 | Resp 16

## 2018-03-12 DIAGNOSIS — M779 Enthesopathy, unspecified: Secondary | ICD-10-CM

## 2018-03-12 DIAGNOSIS — M79671 Pain in right foot: Secondary | ICD-10-CM

## 2018-03-12 MED ORDER — TRIAMCINOLONE ACETONIDE 10 MG/ML IJ SUSP
10.0000 mg | Freq: Once | INTRAMUSCULAR | Status: AC
  Administered 2018-03-12: 10 mg

## 2018-03-12 MED ORDER — DICLOFENAC SODIUM 75 MG PO TBEC: 75.0000 mg | DELAYED_RELEASE_TABLET | Freq: Two times a day (BID) | ORAL | 2 refills | Status: DC

## 2018-03-12 MED FILL — DICLOFENAC SOD EC 75 MG TAB: 75 | 25 days supply | Qty: 50 | Fill #0

## 2018-03-12 NOTE — Progress Notes (Signed)
   Subjective:    Patient ID: Alan Brooks, male    DOB: 25-Jan-1976, 42 y.o.   MRN: [id number]  HPI    Review of Systems  All other systems reviewed and are negative.      Objective:   Physical Exam        Assessment & Plan:

## 2018-03-15 NOTE — Progress Notes (Signed)
Subjective:   Patient ID: Alan Brooks, male   DOB: 42 y.o.   MRN: [id number]   HPI Patient presents stating he has had a lot of pain in the side of his right foot and this is been going on since he kicked something in karate.  States it is very tender makes it hard to walk and he has stopped doing the activities he was doing previously.  Patient does not smoke and likes to be active   Review of Systems  All other systems reviewed and are negative.       Objective:  Physical Exam  Constitutional: He appears well-developed and well-nourished.  Cardiovascular: Intact distal pulses.  Pulmonary/Chest: Effort normal.  Musculoskeletal: Normal range of motion.  Neurological: He is alert.  Skin: Skin is warm.  Nursing note and vitals reviewed.   Neurovascular status found to be intact muscle strength is adequate range of motion within normal limits with patient found to have exquisite discomfort at the peroneal insertion base of fifth metatarsal right with inflammation fluid at the insertion.  I did not note any kind of muscle strength loss or other pathology     Assessment:  Acute peroneal tendinitis at the insertion into the base of the fifth metatarsal right with inflammation fluid buildup     Plan:  H&P condition reviewed and explained.  I went ahead did a careful sheath injection) heel at the insertion into the base of the fifth metatarsal and applied fascial brace to lift up the lateral side of the foot and gave advice for physical therapy and shoe gear modifications.  Reappoint for us to recheck again as symptoms indicate  X-rays indicate that there is no signs of fracture or reactive bone formation or significant distention of soft tissue

## 2018-03-22 ENCOUNTER — Telehealth: Payer: Self-pay | Admitting: Family Medicine

## 2018-03-22 DIAGNOSIS — M545 Low back pain, unspecified: Secondary | ICD-10-CM

## 2018-03-22 DIAGNOSIS — M546 Pain in thoracic spine: Secondary | ICD-10-CM

## 2018-03-22 NOTE — Telephone Encounter (Signed)
Copied from CRM [phone]. Topic: Referral - Request >> Mar 22, 2018  9:46 AM Yvonna Alanisobinson, Andra M wrote: Reason for CRM: Patient called requesting a referral for his Back Pain. [facility] for his back pain to be evaluated.       Thank You!!!

## 2018-03-23 NOTE — Telephone Encounter (Signed)
Orthopedics would want him to see physical therapy before he is referred there.   If agreeable then ok to refer to PT for low back pain

## 2018-03-23 NOTE — Telephone Encounter (Signed)
Left a message for a return call.

## 2018-03-23 NOTE — Telephone Encounter (Signed)
Assessment & Plan:   1. Thoracic spine pain - appears to be more skeletal pain then muscular. Will get xray and treat or refer accordingly.  - DG Thoracic Spine W/Swimmers; Future - Advised motrin for the time being  - Follow up if no improvement   Shirline Freesory Nafziger, NP     Taken from OV note form 01/06/18.  Please advise.

## 2018-03-25 NOTE — Telephone Encounter (Signed)
Pt is ok with being referred to physical therapy for his back. Pt is having pain in T5  area

## 2018-03-25 NOTE — Telephone Encounter (Signed)
Referral placed for therapy.  No further action required.

## 2018-03-30 ENCOUNTER — Ambulatory Visit: Payer: BLUE CROSS/BLUE SHIELD | Attending: Adult Health | Admitting: Physical Therapy

## 2018-03-30 ENCOUNTER — Other Ambulatory Visit: Payer: Self-pay

## 2018-03-30 DIAGNOSIS — M62830 Muscle spasm of back: Secondary | ICD-10-CM | POA: Diagnosis not present

## 2018-03-30 DIAGNOSIS — M546 Pain in thoracic spine: Secondary | ICD-10-CM | POA: Diagnosis not present

## 2018-04-01 ENCOUNTER — Encounter: Payer: Self-pay | Admitting: Physical Therapy

## 2018-04-01 NOTE — Therapy (Signed)
[facility] [address] [place], [place], [address] Phone: [phone]   Fax:  [phone]  Physical Therapy Evaluation  Patient Details  Name: Alan Brooks MRN: [id number] Date of Birth: [birth year] Referring Provider: Harlan Stains NP    Encounter Date: 03/30/2018    Past Medical History:  Diagnosis Date  . Allergy   . Anxiety   . Asthma     History reviewed. No pertinent surgical history.  There were no vitals filed for this visit.   Subjective Assessment - 04/01/18 0732    Subjective  Patient began having pain about three months ago after swining an ax. Hedoes not remmebr having pain prior to that. The pain is more in the middle of his back.     Limitations  Sitting    Currently in Pain?  Yes    Pain Score  3     Pain Location  Thoracic    Pain Orientation  Mid    Pain Descriptors / Indicators  Aching    Pain Type  Chronic pain    Pain Onset  More than a month ago    Pain Frequency  Constant    Aggravating Factors   rolling in bed     Pain Relieving Factors  nothing makes it better     Effect of Pain on Daily Activities  difficulty perfroming sporting activity          [facility] PT Assessment - 04/01/18 0001      Assessment   Medical Diagnosis  Mid Thoraic Pain     Referring Provider  Harlan Stains NP     Onset Date/Surgical Date  --    Hand Dominance  Right    Next MD Visit  Nothing Shceduled     Prior Therapy  None       Precautions   Precautions  None      Restrictions   Weight Bearing Restrictions  No      Balance Screen   Has the patient fallen in the past 6 months  No    Has the patient had a decrease in activity level because of a fear of falling?   No    Is the patient reluctant to leave their home because of a fear of falling?   No      Home Environment   Additional Comments  Nothing significant       Prior Function   Level of Independence  Independent    Vocation  Full time  employment    Materials engineer    Leisure  Karate       Cognition   Overall Cognitive Status  Within Functional Limits for tasks assessed    Attention  Focused    Focused Attention  Appears intact    Memory  Appears intact    Awareness  Appears intact    Problem Solving  Appears intact      Observation/Other Assessments   Focus on Therapeutic Outcomes (FOTO)   41% limitation       Sensation   Light Touch  Appears Intact    Additional Comments  pain can radiate out from where his spine hurts       Coordination   Gross Motor Movements are Fluid and Coordinated  Yes    Fine Motor Movements are Fluid and Coordinated  Yes      Posture/Postural Control   Posture Comments  rounded shoulders  AROM   Overall AROM Comments  patient has pain with rotation in each direction and side bending     AROM Assessment Site  Thoracic;Lumbar    Lumbar Flexion  Pain with lumbar flexion in the thoracic spine     Thoracic Extension  No pain     Thoracic - Right Side Bend  less pain to the right     Thoracic - Left Side Bend  Pain to the left       Strength   Overall Strength Comments  5/5 bilateral UE strength       Palpation   Spinal mobility  decreased PA mobility and increase pain from t-4 ->T6     Palpation comment  spasming in mid thoracic bilateral paraspinals      Bed Mobility   Bed Mobility  --                Objective measurements completed on examination: See above findings.      [facility] Adult PT Treatment/Exercise - 04/01/18 0001      Lumbar Exercises: Stretches   Other Lumbar Stretch Exercise  prayer stretch and sink stretch 2x20 sec bilateral       Lumbar Exercises: Seated   Other Seated Lumbar Exercises  tennis ball trigger point release; seated thoracic extension in low range without pain x10               PT Education - 04/01/18 0734    Education Details  reviewed HEP;     Person(s) Educated  Patient    Methods   Explanation;Tactile cues;Demonstration;Verbal cues    Comprehension  Verbalized understanding;Returned demonstration;Verbal cues required;Tactile cues required;Need further instruction       PT Short Term Goals - 04/01/18 0749      PT SHORT TERM GOAL #1   Title  Patient will demonstrate full thoracic extension and flexion without pain     Time  3    Period  Weeks    Status  New    Target Date  04/22/18      PT SHORT TERM GOAL #2   Title  Patient will demsotrate normal PA mobility of his thoracic spine    Time  3    Period  Weeks    Status  New    Target Date  04/22/18      PT SHORT TERM GOAL #3   Title  Patient will be independent with initial HEP     Time  3    Period  Weeks    Status  New    Target Date  04/22/18        PT Long Term Goals - 04/01/18 0751      PT LONG TERM GOAL #1   Title  Patient will role in bed without increased pain     Time  6    Period  Weeks    Status  New    Target Date  05/13/18      PT LONG TERM GOAL #2   Title  Patient will bend to pick object off theground without pain     Time  6    Period  Weeks    Status  New    Target Date  05/13/18      PT LONG TERM GOAL #3   Title  Patient will demonstrate a27% limitation on FOTO     Time  6    Period  Weeks    Status  New  Target Date  05/13/18             Plan - 04/01/18 0853    Clinical Impression Statement  Patient is a 42 year old male with mid thoracic pain. The pain has been going on for 3 months. He has increased pain when rolling and with thoracic and lumbar flexion. He has decreased PA mobility from t4-t6. He has tenderness to palpation around those segments. his x-rays show a minor scoliosis. He would benefit from skilled therapy for sa stretching manual therapy program to improve movement of thoracic spine.     Rehab Potential  Good    PT Frequency  2x / week    PT Duration  6 weeks    PT Treatment/Interventions  ADLs/Self Care Home Management;Cryotherapy;Administrator, arts;Iontophoresis 4mg /ml Dexamethasone;Ultrasound;Moist Heat;Therapeutic activities;Therapeutic exercise;Patient/family education;Manual techniques;Passive range of motion;Dry needling    PT Next Visit Plan  manual therapy to improve thoracic mobility; review stretching; add scpa retraction; shoulder extension; consider rotational stability training; pallof press and or low range chops. Consdier quadruped raocking.     PT Home Exercise Plan  prayer stretch and lateral; on the sink as well. tennis ball trigger point release; thoracic extension     Consulted and Agree with Plan of Care  Patient       Patient will benefit from skilled therapeutic intervention in order to improve the following deficits and impairments:  Pain, Postural dysfunction, Decreased activity tolerance, Decreased endurance, Decreased range of motion, Decreased strength, Increased muscle spasms  Visit Diagnosis: Pain in thoracic spine  Muscle spasm of back     Problem List Patient Active Problem List   Diagnosis Date Noted  . Genital warts 10/10/2017  . Atopic dermatitis 10/10/2017  . Seborrheic dermatitis 10/10/2017  . Rosacea, acne 09/27/2014  . Difficulty swallowing solids 09/27/2014  . Low back pain 09/27/2014  . Carpal tunnel syndrome of left wrist 01/08/2012  . SEBACEOUS CYST 02/07/2009  . Anxiety state 05/19/2008  . Asthma 05/19/2008    Dessie Coma  PT DPT  04/01/2018, 9:07 AM  [facility] [address] [place], [place], [address] Phone: [phone]   Fax:  [phone]  Name: ADRIANE GUGLIELMO MRN: [id number] Date of Birth: 31-May-1976

## 2018-04-05 MED FILL — BREO ELLIPTA 200-25 MCG INH: 200-25 | 30 days supply | Qty: 60 | Fill #5

## 2018-04-13 ENCOUNTER — Ambulatory Visit: Payer: BLUE CROSS/BLUE SHIELD | Admitting: Physical Therapy

## 2018-04-20 ENCOUNTER — Ambulatory Visit: Payer: BLUE CROSS/BLUE SHIELD | Admitting: Physical Therapy

## 2018-04-20 DIAGNOSIS — M62830 Muscle spasm of back: Secondary | ICD-10-CM | POA: Diagnosis not present

## 2018-04-20 DIAGNOSIS — M546 Pain in thoracic spine: Secondary | ICD-10-CM | POA: Diagnosis not present

## 2018-04-20 NOTE — Therapy (Addendum)
[facility] [address] [place], [place], [address] Phone: [phone]   Fax:  [phone]  Physical Therapy Treatment  Patient Details  Name: Alan Brooks MRN: [id number] Date of Birth: 11-Sep-1975 Referring Provider: Harlan Stainsroy Nafziger NP    Encounter Date: 04/20/2018  PT End of Session - 04/20/18 1600    Visit Number  2    Number of Visits  16    Date for PT Re-Evaluation  05/11/18    PT Start Time  0355    PT Stop Time  0445    PT Time Calculation (min)  50 min       Past Medical History:  Diagnosis Date  . Allergy   . Anxiety   . Asthma     No past surgical history on file.  There were no vitals filed for this visit.                    [facility] Adult PT Treatment/Exercise - 04/20/18 0001      Lumbar Exercises: Stretches   Other Lumbar Stretch Exercise  hooklying with 2 pillows under head, thoracic extension over 1/2 foam roller- 5 thoracic levels , arms across chest/ behind head     Other Lumbar Stretch Exercise  book openings x 5 each side       Lumbar Exercises: Seated   Other Seated Lumbar Exercises  thoracic extension over back of chair       Shoulder Exercises: Supine   Other Supine Exercises  --      Modalities   Modalities  Moist Heat      Moist Heat Therapy   Number Minutes Moist Heat  20 Minutes    Moist Heat Location  Lumbar Spine      Manual Therapy   Manual Therapy  Soft tissue mobilization    Soft tissue mobilization  Prone deep pressure bilateral levator, rhomboids and thoracic paraspinals.                PT Short Term Goals - 04/01/18 0749      PT SHORT TERM GOAL #1   Title  Patient will demonstrate full thoracic extension and flexion without pain     Time  3    Period  Weeks    Status  New    Target Date  04/22/18      PT SHORT TERM GOAL #2   Title  Patient will demsotrate normal PA mobility of his thoracic spine    Time  3    Period  Weeks    Status   New    Target Date  04/22/18      PT SHORT TERM GOAL #3   Title  Patient will be independent with initial HEP     Time  3    Period  Weeks    Status  New    Target Date  04/22/18        PT Long Term Goals - 04/01/18 0751      PT LONG TERM GOAL #1   Title  Patient will role in bed without increased pain     Time  6    Period  Weeks    Status  New    Target Date  05/13/18      PT LONG TERM GOAL #2   Title  Patient will bend to pick object off theground without pain     Time  6    Period  Weeks    Status  New    Target Date  05/13/18      PT LONG TERM GOAL #3   Title  Patient will demonstrate a27% limitation on FOTO     Time  6    Period  Weeks    Status  New    Target Date  05/13/18            Plan - 04/20/18 1700    Clinical Impression Statement  Patient arrives reporting he is about the same. He has difficulty rating his pain. He reports consistency with HEP however no change in pain thus far. Began thoracic extension on hapf foam roller with pt reporting tenderness in thoracic spine but able to tolerate with pillows under head. Began upper trunk rotations. Most time spent with soft tissue work to upper back followed by heat pack. After ward pt reports no pain and feels alot better. Pt is interested in dry needling and more soft tissue work.     PT Next Visit Plan  manual therapy to improve thoracic mobility; review stretching; add scpa retraction; shoulder extension; consider rotational stability training; pallof press and or low range chops. Consdier quadruped raocking.     PT Home Exercise Plan  prayer stretch and lateral; on the sink as well. tennis ball trigger point release; thoracic extension     Consulted and Agree with Plan of Care  Patient       Patient will benefit from skilled therapeutic intervention in order to improve the following deficits and impairments:  Pain, Postural dysfunction, Decreased activity tolerance, Decreased endurance, Decreased range  of motion, Decreased strength, Increased muscle spasms  Visit Diagnosis: Pain in thoracic spine  Muscle spasm of back     Problem List Patient Active Problem List   Diagnosis Date Noted  . Genital warts 10/10/2017  . Atopic dermatitis 10/10/2017  . Seborrheic dermatitis 10/10/2017  . Rosacea, acne 09/27/2014  . Difficulty swallowing solids 09/27/2014  . Low back pain 09/27/2014  . Carpal tunnel syndrome of left wrist 01/08/2012  . SEBACEOUS CYST 02/07/2009  . Anxiety state 05/19/2008  . Asthma 05/19/2008    Sherrie Mustache , PTA 04/20/2018, 5:30 PM  [facility] [address] [place], [place], [address] Phone: [phone]   Fax:  [phone]  Name: Alan Brooks MRN: [id number] Date of Birth: 1975-10-08

## 2018-04-27 ENCOUNTER — Ambulatory Visit: Payer: BLUE CROSS/BLUE SHIELD | Attending: Adult Health | Admitting: Physical Therapy

## 2018-04-27 DIAGNOSIS — M546 Pain in thoracic spine: Secondary | ICD-10-CM | POA: Diagnosis not present

## 2018-04-27 DIAGNOSIS — M62830 Muscle spasm of back: Secondary | ICD-10-CM | POA: Diagnosis not present

## 2018-04-27 MED FILL — CITALOPRAM HBR 40 MG TABLET: 40 | 90 days supply | Qty: 90 | Fill #2

## 2018-04-27 MED FILL — PANTOPRAZOLE SOD DR 40 MG T: 40 | 90 days supply | Qty: 90 | Fill #1

## 2018-04-28 ENCOUNTER — Encounter: Payer: Self-pay | Admitting: Physical Therapy

## 2018-04-28 MED FILL — BREO ELLIPTA 200-25 MCG INH: 200-25 | 30 days supply | Qty: 60 | Fill #6

## 2018-04-28 NOTE — Therapy (Signed)
[facility] [address] [place], [place], [address] Phone: [phone]   Fax:  [phone]  Physical Therapy Treatment  Patient Details  Name: Alan Brooks MRN: [id number] Date of Birth: [birth year] Referring Provider: Harlan Stains NP    Encounter Date: 04/27/2018  PT End of Session - 04/28/18 1444    Visit Number  3    Number of Visits  16    Date for PT Re-Evaluation  05/11/18    PT Start Time  1415    PT Stop Time  1457    PT Time Calculation (min)  42 min       Past Medical History:  Diagnosis Date  . Allergy   . Anxiety   . Asthma     History reviewed. No pertinent surgical history.  There were no vitals filed for this visit.  Subjective Assessment - 04/28/18 1342    Subjective  Patient reports the back feels better for a few days after therapy but then the pain returns.     Limitations  Sitting    Currently in Pain?  Yes    Pain Score  3     Pain Location  Thoracic    Pain Orientation  Mid    Pain Descriptors / Indicators  Aching    Pain Type  Chronic pain    Pain Onset  More than a month ago    Pain Frequency  Constant    Aggravating Factors   rolling in bed     Pain Relieving Factors  nothing     Effect of Pain on Daily Activities  difficulty perfroming activity                        [facility] Adult PT Treatment/Exercise - 04/28/18 0001      Lumbar Exercises: Stretches   Other Lumbar Stretch Exercise  hooklying with 2 pillows under head, thoracic extension over 1/2 foam roller- 5 thoracic levels , arms across chest/ behind head     Other Lumbar Stretch Exercise  book openings x 5 each side       Lumbar Exercises: Seated   Other Seated Lumbar Exercises  thoracic extension without chair 2nd to feeling of compression with chair.       Shoulder Exercises: Standing   Other Standing Exercises  shoulder extension 3x10; scap retraction 3x10 red       Manual Therapy   Manual Therapy   Soft tissue mobilization;Joint mobilization    Joint Mobilization  PA mobilization from t_5 to T-10; griss rib mobilization     Soft tissue mobilization  Prone deep pressure bilateral levator, rhomboids and thoracic paraspinals.        Trigger Point Dry Needling - 04/28/18 1419    Consent Given?  Yes    Education Handout Provided  Yes    Muscles Treated Upper Body  Longissimus    Muscles Treated Lower Body  --   Left T-6 t-7 t-8          PT Education - 04/28/18 1346    Education Details  reviewed HEP and new exercises     Person(s) Educated  Patient    Methods  Explanation;Demonstration;Tactile cues;Verbal cues    Comprehension  Verbalized understanding;Returned demonstration;Verbal cues required;Tactile cues required       PT Short Term Goals - 04/28/18 1356      PT SHORT TERM GOAL #1   Title  Patient will demonstrate full thoracic  extension and flexion without pain     Baseline  improving but still painful     Time  3    Period  Weeks    Status  Achieved      PT SHORT TERM GOAL #2   Title  Patient will demsotrate normal PA mobility of his thoracic spine    Time  3    Period  Weeks    Status  Achieved      PT SHORT TERM GOAL #3   Title  Patient will be independent with initial HEP     Time  3    Period  Weeks    Status  Achieved        PT Long Term Goals - 04/01/18 0751      PT LONG TERM GOAL #1   Title  Patient will role in bed without increased pain     Time  6    Period  Weeks    Status  New    Target Date  05/13/18      PT LONG TERM GOAL #2   Title  Patient will bend to pick object off theground without pain     Time  6    Period  Weeks    Status  New    Target Date  05/13/18      PT LONG TERM GOAL #3   Title  Patient will demonstrate a27% limitation on FOTO     Time  6    Period  Weeks    Status  New    Target Date  05/13/18            Plan - 04/28/18 1347    Clinical Impression Statement  Good tolerance to trigger point dry  needling. Great twtich respicse to thoracic paraspinals in 3 spots. Improved PA mobility in the mid thoracic spine. He is making progress. Therapy added in retractions and shoulder extensions. He could feel the retractions.      Clinical Presentation  Evolving    Clinical Decision Making  Moderate    Rehab Potential  Good    PT Frequency  2x / week    PT Duration  6 weeks    PT Treatment/Interventions  ADLs/Self Care Home Management;Cryotherapy;Psychologist, educational;Iontophoresis 4mg /ml Dexamethasone;Ultrasound;Moist Heat;Therapeutic activities;Therapeutic exercise;Patient/family education;Manual techniques;Passive range of motion;Dry needling    PT Next Visit Plan  manual therapy to improve thoracic mobility; review stretching; add scpa retraction; shoulder extension; consider rotational stability training; pallof press and or low range chops. Consdier quadruped raocking.     PT Home Exercise Plan  prayer stretch and lateral; on the sink as well. tennis ball trigger point release; thoracic extension     Consulted and Agree with Plan of Care  Patient       Patient will benefit from skilled therapeutic intervention in order to improve the following deficits and impairments:  Pain, Postural dysfunction, Decreased activity tolerance, Decreased endurance, Decreased range of motion, Decreased strength, Increased muscle spasms  Visit Diagnosis: Pain in thoracic spine  Muscle spasm of back     Problem List Patient Active Problem List   Diagnosis Date Noted  . Genital warts 10/10/2017  . Atopic dermatitis 10/10/2017  . Seborrheic dermatitis 10/10/2017  . Rosacea, acne 09/27/2014  . Difficulty swallowing solids 09/27/2014  . Low back pain 09/27/2014  . Carpal tunnel syndrome of left wrist 01/08/2012  . SEBACEOUS CYST 02/07/2009  . Anxiety state 05/19/2008  . Asthma 05/19/2008  Dessie Coma PT DPT  04/28/2018, 2:45 PM  [facility] [address] [place], [place], [address] Phone: [phone]   Fax:  [phone]  Name: Alan Brooks MRN: [id number] Date of Birth: 1976/07/12

## 2018-05-04 ENCOUNTER — Ambulatory Visit: Payer: BLUE CROSS/BLUE SHIELD | Admitting: Physical Therapy

## 2018-05-04 DIAGNOSIS — M62830 Muscle spasm of back: Secondary | ICD-10-CM

## 2018-05-04 DIAGNOSIS — M546 Pain in thoracic spine: Secondary | ICD-10-CM

## 2018-05-05 NOTE — Therapy (Addendum)
[place], [place], [address] Phone: [phone]   Fax:  [phone]  Physical Therapy Treatment/ Discharge   Patient Details  Name: Alan Brooks MRN: [id number] Date of Birth: [birth year] Referring Provider: Pattricia Boss NP    Encounter Date: 05/04/2018  PT End of Session - 05/05/18 2043    Visit Number  4    Number of Visits  16    Date for PT Re-Evaluation  05/11/18    PT Start Time  0430    PT Stop Time  0511    PT Time Calculation (min)  41 min    Activity Tolerance  Patient tolerated treatment well    Behavior During Therapy  [facility] for tasks assessed/performed       Past Medical History:  Diagnosis Date  . Allergy   . Anxiety   . Asthma     No past surgical history on file.  There were no vitals filed for this visit.  Subjective Assessment - 05/05/18 2040    Subjective  Patient reports his pain is better but his thoracic spine is still very sore. He reports his pain is now just with certiain movements but the segment is still very sore. He thinks he dry needling helped.     Currently in Pain?  Yes    Pain Score  2     Pain Location  Thoracic    Pain Orientation  Mid    Pain Descriptors / Indicators  Aching    Pain Type  Chronic pain    Pain Onset  More than a month ago    Pain Frequency  Constant    Aggravating Factors   rolling in bed     Pain Relieving Factors  nothing     Effect of Pain on Daily Activities  difficult rolling in bed                        [facility] Adult PT Treatment/Exercise - 05/05/18 0001      Self-Care   Self-Care  Other Self-Care Comments    Other Self-Care Comments   reviewed HEP and self dsoft tissue mobilization going forward.       Manual Therapy   Joint Mobilization  PA mobilization from t_5 to T-10; griss rib mobilization     Soft tissue mobilization  Prone deep pressure bilateral levator, rhomboids and thoracic paraspinals.         Trigger Point Dry Needling - 05/05/18 2052    Muscles Treated Upper Body  Longissimus    Longissimus Response  Twitch response elicited   left t6, t7, t8           PT Education - 05/05/18 2043    Education Details  reviewed self softtissue mobilization     Person(s) Educated  Patient    Methods  Explanation;Demonstration;Tactile cues;Verbal cues    Comprehension  Verbalized understanding;Returned demonstration;Verbal cues required;Tactile cues required       PT Short Term Goals - 04/28/18 1356      PT SHORT TERM GOAL #1   Title  Patient will demonstrate full thoracic extension and flexion without pain     Baseline  improving but still painful     Time  3    Period  Weeks    Status  Achieved      PT SHORT TERM GOAL #2   Title  Patient will demsotrate normal PA mobility of his thoracic  spine    Time  3    Period  Weeks    Status  Achieved      PT SHORT TERM GOAL #3   Title  Patient will be independent with initial HEP     Time  3    Period  Weeks    Status  Achieved        PT Long Term Goals - 04/01/18 0751      PT LONG TERM GOAL #1   Title  Patient will role in bed without increased pain     Time  6    Period  Weeks    Status  New    Target Date  05/13/18      PT LONG TERM GOAL #2   Title  Patient will bend to pick object off theground without pain     Time  6    Period  Weeks    Status  New    Target Date  05/13/18      PT LONG TERM GOAL #3   Title  Patient will demonstrate a27% limitation on FOTO     Time  6    Period  Weeks    Status  New    Target Date  05/13/18            Plan - 05/05/18 2047    Clinical Impression Statement  Patient again had a good twitch respose around T6 and t7. He has improved movement of his thoracic spine but he continues to have pain. The paitent is unsure what his payment is going to be. If his deductable is met he will schedule more visits. If he continues to have pain it was suggested he go back to the MD.  He had no change in his FOTO score.     Clinical Presentation  Evolving    Clinical Decision Making  Moderate    Rehab Potential  Good    PT Frequency  2x / week    PT Duration  6 weeks    PT Treatment/Interventions  ADLs/Self Care Home Management;Cryotherapy;Scientist, product/process development;Iontophoresis 40m/ml Dexamethasone;Ultrasound;Moist Heat;Therapeutic activities;Therapeutic exercise;Patient/family education;Manual techniques;Passive range of motion;Dry needling    PT Next Visit Plan  manual therapy to improve thoracic mobility; review stretching; add scpa retraction; shoulder extension; consider rotational stability training; pallof press and or low range chops. Consdier quadruped raocking.     PT Home Exercise Plan  prayer stretch and lateral; on the sink as well. tennis ball trigger point release; thoracic extension     Consulted and Agree with Plan of Care  Patient       Patient will benefit from skilled therapeutic intervention in order to improve the following deficits and impairments:  Pain, Postural dysfunction, Decreased activity tolerance, Decreased endurance, Decreased range of motion, Decreased strength, Increased muscle spasms  Visit Diagnosis: Pain in thoracic spine  Muscle spasm of back  PHYSICAL THERAPY DISCHARGE SUMMARY  Visits from Start of Care: 4   Current functional level related to goals / functional outcomes: Improved pain with treatment but no carryover    Remaining deficits: Patient to return to MD    Education / Equipment: HEP  Plan: Patient agrees to discharge.  Patient goals were not met. Patient is being discharged due to not returning since the last visit.  ?????       Problem List Patient Active Problem List   Diagnosis Date Noted  . Genital warts 10/10/2017  . Atopic dermatitis 10/10/2017  .  Seborrheic dermatitis 10/10/2017  . Rosacea, acne 09/27/2014  . Difficulty swallowing solids 09/27/2014  . Low back pain 09/27/2014  .  Carpal tunnel syndrome of left wrist 01/08/2012  . SEBACEOUS CYST 02/07/2009  . Anxiety state 05/19/2008  . Asthma 05/19/2008    Carney Living PT DPT  05/05/2018, 8:54 PM   05/26/2018 08:45   [facility] [address] [place], [place], [address] Phone: [phone]   Fax:  [phone]  Name: Alan Brooks MRN: [id number] Date of Birth: 09/02/1975

## 2018-05-06 ENCOUNTER — Other Ambulatory Visit: Payer: Self-pay | Admitting: Adult Health

## 2018-05-06 ENCOUNTER — Telehealth: Payer: Self-pay | Admitting: Adult Health

## 2018-05-06 DIAGNOSIS — M546 Pain in thoracic spine: Secondary | ICD-10-CM

## 2018-05-06 NOTE — Telephone Encounter (Signed)
Copied from CRM [id number]. Topic: General - Other >> May 06, 2018 11:23 AM Tamela OddiHarris, Brenda J wrote: Reason for CRM: Patient called to request a referral for an MRI for his back.  Patient stated that he did try the rehab but it did not help his back.  He wanted to know the next course of treatment and would like to get the MRI.  CB# [phone].

## 2018-05-06 NOTE — Telephone Encounter (Signed)
Pt notified that referral for MRI has been placed.  No further action required.

## 2018-05-06 NOTE — Telephone Encounter (Signed)
MRI ordered

## 2018-05-15 ENCOUNTER — Ambulatory Visit
Admission: RE | Admit: 2018-05-15 | Discharge: 2018-05-15 | Disposition: A | Discharge status: Home / Self Care | Payer: BLUE CROSS/BLUE SHIELD | Source: Ambulatory Visit | Attending: Adult Health | Admitting: Adult Health

## 2018-05-15 DIAGNOSIS — M546 Pain in thoracic spine: Secondary | ICD-10-CM

## 2018-05-15 DIAGNOSIS — M5124 Other intervertebral disc displacement, thoracic region: Secondary | ICD-10-CM | POA: Diagnosis not present

## 2018-05-18 ENCOUNTER — Other Ambulatory Visit: Payer: Self-pay | Admitting: Adult Health

## 2018-05-18 ENCOUNTER — Telehealth: Payer: Self-pay | Admitting: Adult Health

## 2018-05-18 DIAGNOSIS — M546 Pain in thoracic spine: Secondary | ICD-10-CM

## 2018-05-18 DIAGNOSIS — M545 Low back pain, unspecified: Secondary | ICD-10-CM

## 2018-05-18 NOTE — Telephone Encounter (Signed)
Copied from CRM [id number]. Topic: Quick Communication - Other Results >> May 18, 2018 10:54 AM Crist InfanteHarrald, Kathy J wrote: Pt calling back for MRI results.

## 2018-05-19 NOTE — Telephone Encounter (Signed)
Patient is aware 

## 2018-05-28 ENCOUNTER — Ambulatory Visit (INDEPENDENT_AMBULATORY_CARE_PROVIDER_SITE_OTHER): Payer: BLUE CROSS/BLUE SHIELD | Admitting: Orthopaedic Surgery

## 2018-05-31 ENCOUNTER — Other Ambulatory Visit: Payer: Self-pay | Admitting: Adult Health

## 2018-05-31 DIAGNOSIS — Z760 Encounter for issue of repeat prescription: Secondary | ICD-10-CM

## 2018-05-31 DIAGNOSIS — J4540 Moderate persistent asthma, uncomplicated: Secondary | ICD-10-CM

## 2018-06-03 MED FILL — BREO ELLIPTA 200-25 MCG INH: 200-25 | 30 days supply | Qty: 60 | Fill #0

## 2018-06-03 NOTE — Telephone Encounter (Signed)
Sent to the pharmacy by e-scribe.  Due for annual in Jan.

## 2018-06-11 ENCOUNTER — Ambulatory Visit (INDEPENDENT_AMBULATORY_CARE_PROVIDER_SITE_OTHER): Payer: BLUE CROSS/BLUE SHIELD | Admitting: Orthopaedic Surgery

## 2018-06-11 ENCOUNTER — Encounter (INDEPENDENT_AMBULATORY_CARE_PROVIDER_SITE_OTHER): Payer: Self-pay | Admitting: Orthopaedic Surgery

## 2018-06-11 VITALS — BP 118/78 | HR 95 | Ht 73.0 in | Wt 190.0 lb

## 2018-06-11 DIAGNOSIS — M5124 Other intervertebral disc displacement, thoracic region: Secondary | ICD-10-CM | POA: Insufficient documentation

## 2018-06-11 DIAGNOSIS — S9031XS Contusion of right foot, sequela: Secondary | ICD-10-CM

## 2018-06-11 NOTE — Progress Notes (Addendum)
Office Visit Note/Orthopedic consultation for thoracic disc protrusion   Patient: Alan Brooks           Date of Birth: Apr 01, 1976           MRN: [id number] Visit Date: 06/11/2018              Requested by: Shirline Frees, NP [address] [place], [place] [address] PCP: Shirline Frees, NP   Assessment & Plan: Visit Diagnoses:  1. Thoracic disc herniation   2. Contusion of right foot, sequela     Plan: MRI scan thoracic spine images reviewed I gave him a copy of the report.  He has a moderate sized disc protrusion at T9-10 without cord deformity.  Plain radiographs were negative other than trace curvature.  No evidence of myelopathy.  He can progress with activity as tolerated with the symptoms.  We discussed that he should get improvement with time and he can return if he has persistent problems.  Thank you for the opportunity to see him in consultation.  Follow-Up Instructions: Return if symptoms worsen or fail to improve.   Orders:  No orders of the defined types were placed in this encounter.  No orders of the defined types were placed in this encounter.     Procedures: No procedures performed   Clinical Data: No additional findings.   Subjective: Chief Complaint  Patient presents with  . Middle Back - Pain  . Right Foot - Pain    HPI 42 year old male with problems with thoracic pain x3 to 4 months.  He is active in karate but this does not work call and exact incident where he had onset of pain.  He had some prominence of the right foot when it was injured when he kicked someone contact in the elbow with the lateral aspect of his midfoot about 3 months ago.  He has been treated by Dr. Orlene Och and x-rays have been negative.  He denies lower extremity radicular pain no fever chills no numbness or tingling.  Thoracic pain is described as aching.  No MVA or falls.  He has not done any martial arts recently due to the thoracic discomfort.  No myelopathic  symptoms.  Patient previously treated with Voltaren with minimal improvement.  Review of Systems review of systems positive for left carpal tunnel syndrome, sebaceous cyst, anxiety, asthma, thoracic disc herniation, genital warts, dermatitis, negative bowel or bladder symptoms, otherwise negative as it pertains to HPI.   Objective: Vital Signs: BP 118/78   Pulse 95   Ht 6\' 1"  (1.854 m)   Wt 190 lb (86.2 kg)   BMI 25.07 kg/m   Physical Exam  Constitutional: He is oriented to person, place, and time. He appears well-developed and well-nourished.  HENT:  Head: Normocephalic and atraumatic.  Eyes: Pupils are equal, round, and reactive to light. EOM are normal.  Neck: No tracheal deviation present. No thyromegaly present.  Cardiovascular: Normal rate.  Pulmonary/Chest: Effort normal. He has no wheezes.  Abdominal: Soft. Bowel sounds are normal.  Neurological: He is alert and oriented to person, place, and time.  Skin: Skin is warm and dry. Capillary refill takes less than 2 seconds.  Psychiatric: He has a normal mood and affect. His behavior is normal. Judgment and thought content normal.    Ortho Exam patient has some tenderness palpation from T7-T12 just off the midline.  No winging of the scapula upper lower extremity reflexes are 2+.  No brachial plexus tenderness normal  cervical range of motion.  Negative Spurling flexion chin to chest no pain with compression and no change of his symptoms with cervical distraction.  Excellent lower extremity strength normal reflexes no clonus.  He has some tenderness over the extensor brevis right foot midfoot over the cuboid without pain with resisted toe extension.  Peroneals are normal with good strength. Specialty Comments:  No specialty comments available.  Imaging: CLINICAL DATA:  Mid back pain for 4 months after injury swinging an axe. No previous relevant surgery.  EXAM: MRI THORACIC SPINE WITHOUT CONTRAST  TECHNIQUE: Multiplanar,  multisequence MR imaging of the thoracic spine was performed. No intravenous contrast was administered.  COMPARISON:  Thoracic spine radiographs 01/06/2018  FINDINGS: Alignment:  Normal.  Vertebrae: No acute or suspicious osseous findings. There are scattered small hemangiomas, most prominent within the T7 vertebral body.  Cord: The thoracic cord appears normal in signal and caliber. The conus medullaris is incompletely visualized, although extends to approximately the L1 level.  Paraspinal and other soft tissues: No significant paraspinal abnormalities.  Disc levels:  There is a moderate size right paracentral disc protrusion at T9-10. This exerts mass effect on the thecal sac, although causes no cord deformity or definite nerve root encroachment.  There are no other significant disc space findings. The foramina appear patent at all levels.  IMPRESSION: 1. Moderate size right paracentral disc protrusion at T9-10. No cord deformity or nerve root encroachment. 2. No other significant disc space findings. 3. No acute osseous or paraspinal findings   Electronically Signed   By: Carey Bullocks M.D.   On: 05/15/2018 10:47   PMFS History: Patient Active Problem List   Diagnosis Date Noted  . Genital warts 10/10/2017  . Atopic dermatitis 10/10/2017  . Seborrheic dermatitis 10/10/2017  . Rosacea, acne 09/27/2014  . Difficulty swallowing solids 09/27/2014  . Low back pain 09/27/2014  . Carpal tunnel syndrome of left wrist 01/08/2012  . SEBACEOUS CYST 02/07/2009  . Anxiety state 05/19/2008  . Asthma 05/19/2008   Past Medical History:  Diagnosis Date  . Allergy   . Anxiety   . Asthma     Family History  Problem Relation Age of Onset  . Heart attack Maternal Grandfather   . Heart attack Paternal Grandfather   . Asthma Brother   . Depression Other        entire family   . Anxiety disorder Other        entire family     History reviewed. No  pertinent surgical history. Social History   Occupational History  . Not on file  Tobacco Use  . Smoking status: Former Smoker    Packs/day: 1.00    Years: 15.00    Pack years: 15.00    Types: Cigarettes  . Smokeless tobacco: Never Used  Substance and Sexual Activity  . Alcohol use: No  . Drug use: No  . Sexual activity: Not on file

## 2018-06-22 DIAGNOSIS — H5211 Myopia, right eye: Secondary | ICD-10-CM | POA: Diagnosis not present

## 2018-06-28 ENCOUNTER — Encounter: Payer: Self-pay | Admitting: Family Medicine

## 2018-06-28 ENCOUNTER — Ambulatory Visit: Payer: BLUE CROSS/BLUE SHIELD | Admitting: Podiatry

## 2018-06-28 ENCOUNTER — Ambulatory Visit: Payer: BLUE CROSS/BLUE SHIELD | Admitting: Family Medicine

## 2018-06-28 ENCOUNTER — Encounter: Payer: Self-pay | Admitting: Podiatry

## 2018-06-28 VITALS — BP 142/84 | HR 73 | Temp 97.8°F | Ht 73.0 in | Wt 195.4 lb

## 2018-06-28 DIAGNOSIS — L814 Other melanin hyperpigmentation: Secondary | ICD-10-CM | POA: Diagnosis not present

## 2018-06-28 DIAGNOSIS — M7671 Peroneal tendinitis, right leg: Secondary | ICD-10-CM | POA: Diagnosis not present

## 2018-06-28 DIAGNOSIS — M779 Enthesopathy, unspecified: Secondary | ICD-10-CM

## 2018-06-28 DIAGNOSIS — T148XXA Other injury of unspecified body region, initial encounter: Secondary | ICD-10-CM | POA: Diagnosis not present

## 2018-06-28 DIAGNOSIS — L400 Psoriasis vulgaris: Secondary | ICD-10-CM | POA: Diagnosis not present

## 2018-06-28 DIAGNOSIS — D225 Melanocytic nevi of trunk: Secondary | ICD-10-CM | POA: Diagnosis not present

## 2018-06-28 MED ORDER — IBUPROFEN 800 MG PO TABS: 800.0000 mg | ORAL_TABLET | Freq: Three times a day (TID) | ORAL | 0 refills | Status: DC | PRN

## 2018-06-28 MED ORDER — PREDNISONE 10 MG PO TABS: ORAL_TABLET | ORAL | 0 refills | Status: DC

## 2018-06-28 MED ORDER — CYCLOBENZAPRINE HCL 10 MG PO TABS: 10.0000 mg | ORAL_TABLET | Freq: Three times a day (TID) | ORAL | 0 refills | Status: DC | PRN

## 2018-06-28 MED ORDER — TRIAMCINOLONE ACETONIDE 10 MG/ML IJ SUSP
10.0000 mg | Freq: Once | INTRAMUSCULAR | Status: AC
  Administered 2018-06-28: 10 mg

## 2018-06-28 MED FILL — CICLOPIROX 1% SHAMPOO: 1 | 30 days supply | Qty: 120 | Fill #0

## 2018-06-28 MED FILL — predniSONE 10 MG (48) TBPK: 10 | 12 days supply | Qty: 48 | Fill #0

## 2018-06-28 MED FILL — HYDROCORTISONE 2.5% CREAM: 2.5 | 20 days supply | Qty: 60 | Fill #0

## 2018-06-28 MED FILL — CLOBETASOL 0.05% SHAMPOO: 0.05 | 30 days supply | Qty: 118 | Fill #0

## 2018-06-28 MED FILL — CLOBETASOL 0.05% SOLUTION: 0.05 | 25 days supply | Qty: 50 | Fill #0

## 2018-06-28 MED FILL — CYCLOBENZAPRINE HCL 10 MG T: 10 | 10 days supply | Qty: 30 | Fill #0

## 2018-06-28 MED FILL — IBUPROFEN 800 MG TAB: 800 | 10 days supply | Qty: 30 | Fill #0

## 2018-06-28 NOTE — Patient Instructions (Signed)
Heating pad Ibuprofen with food Muscle relaxer  STOP using the hard balls! I think you've bruised your muscle or even slight tear is possible.

## 2018-06-28 NOTE — Progress Notes (Signed)
Patient: Alan Brooks MRN: [id number] DOB: 07/09/1976 PCP: Shirline Frees, NP     Subjective:  Chief Complaint  Patient presents with  . mid back pain    x 3-4 months    HPI: The patient is a 42 y.o. male who presents today for mid back pain that has been going on since April of 2019. He was building a deck in his back house and his back started to hurt the next day. A month went by and it didn't get any better. He had an xray in May and it was normal. He had no improvement so started PT x 6 weeks. He got a MRI after this time (04/2018) and was told he had a herniated disc. Imaging shows a moderate size right paracentral disc protrusion at T9-T10. No cord deformity. He saw a spine surgeon 2-3 weeks ago and was told sometimes these get better on their own and was told he could work out again when it stopped hurting. He was told to get a TENS unit. It feels good, but it doesn't help the pain. He has been using a rubber ball where he has been having muscular pain. A month ago he had some new pain that is shooting out from the original spot of pain. Area is horizontal and shoots underneath his right shoulder blade and goes to the lateral edge of his back. He can localize the origin of the pain with the rubber ball.   He also is having some trouble with deep breathing and it wakes him up at night. He has not tried a muscle relaxer nor gabapentin. He is having no numbness, weakness or radicular symptoms in his arms and no sensation deficits over his back. He has tried aleve and this seems to give him some relief, but he doesn't really want to take this daily. Pain rated as a 6/10 and described as sharp when it hits.    Review of Systems  Constitutional: Negative for chills and fever.  Respiratory: Negative for shortness of breath.   Gastrointestinal: Negative for abdominal distention.  Genitourinary: Negative for difficulty urinating.  Musculoskeletal: Positive for back pain. Negative for neck  pain.  Neurological: Negative for weakness and numbness.    Allergies Patient has No Known Allergies.  Past Medical History Patient  has a past medical history of Allergy, Anxiety, and Asthma.  Surgical History Patient  has no past surgical history on file.  Family History Pateint's family history includes Anxiety disorder in his other; Asthma in his brother; Depression in his other; Heart attack in his maternal grandfather and paternal grandfather.  Social History Patient  reports that he has quit smoking. His smoking use included cigarettes. He has a 15.00 pack-year smoking history. He has never used smokeless tobacco. He reports that he does not drink alcohol or use drugs.    Objective: Vitals:   06/28/18 1145  BP: (!) 142/84  Pulse: 73  Temp: 97.8 F (36.6 C)  TempSrc: Oral  SpO2: 96%  Weight: 195 lb 6.4 oz (88.6 kg)  Height: 6\' 1"  (1.854 m)    Body mass index is 25.78 kg/m.  Physical Exam  Constitutional: He is oriented to person, place, and time. He appears well-developed and well-nourished.  Cardiovascular: Normal rate, regular rhythm and normal heart sounds.  Pulmonary/Chest: Effort normal and breath sounds normal.  Abdominal: Soft. Bowel sounds are normal.  Musculoskeletal:  He has a knotted muscle just inferior to his right shoulder blade that is TTP, possibly slight  edema. No weakness in his bilateral upper arms, no sensation loss. Reflexes normal.   Neurological: He is alert and oriented to person, place, and time.  Vitals reviewed.      Assessment/plan: 1. Muscle contusion I think he has contused/bruised or even possibly slightly torn his rhomboid from excessive rolling with his full body weight on golf ball and hard rubber ball. Want him to stop this, try a heating pad and will do muscle relaxer prn and nsaid prn. Advised to take with food and ideally don't want him on longer than 7 days. Discussed this can take 4-6 weeks to get better, but if worsening  pain, any radicular symptoms would f/u with spine specialist again. Drowsy precautions given with medication.      Return if symptoms worsen or fail to improve.   Orland Mustard, MD [place] [facility]   06/28/2018

## 2018-06-29 MED FILL — BREO ELLIPTA 200-25 MCG INH: 200-25 | 30 days supply | Qty: 60 | Fill #1

## 2018-06-30 LAB — RHEUMATOID FACTOR: Rhuematoid fact SerPl-aCnc: <14 IU/mL (ref <14)

## 2018-06-30 LAB — ANA: Anti Nuclear Antibody(ANA): NEGATIVE

## 2018-06-30 LAB — C-REACTIVE PROTEIN: CRP: 2.1 mg/L (ref <8.0)

## 2018-06-30 LAB — URIC ACID: Uric Acid, Serum: 6.1 mg/dL (ref 4.0–8.0)

## 2018-06-30 LAB — SEDIMENTATION RATE: Sed Rate: 2 mm/h (ref 0–15)

## 2018-06-30 NOTE — Progress Notes (Signed)
Subjective:   Patient ID: Alan Brooks, male   DOB: 42 y.o.   MRN: [id number]   HPI Patient presents stating he had a good response to the medication but continues to have discomfort on the dorsum of the right foot and it seems that the pain comes and goes   ROS      Objective:  Physical Exam  Neurovascular status intact with tendinitis dorsal right that is more distal within the base of the fourth metatarsal with inflammation and one area of exquisite discomfort     Assessment:  Difficult to understand condition this is been present for over 6 months and has had periods of being improved with probability for some form of discrete tendinitis or inflammatory ligament inflammation     Plan:  H&P reviewed condition and at this point will get a completely immobilized with air fracture walker along with aggressive ice anti-inflammatories and I went ahead did a discrete steroid injection around this area 3 mg dexamethasone Kenalog 5 Milgram Xylocaine and advised on reduced activity for the next 3 weeks.  If symptoms persist can require CT scan or MRI  X-rays were negative for signs of change in bony structure

## 2018-07-05 NOTE — Progress Notes (Signed)
Results were normal

## 2018-07-06 ENCOUNTER — Telehealth: Payer: Self-pay | Admitting: *Deleted

## 2018-07-06 NOTE — Telephone Encounter (Signed)
I informed pt of Dr. Beverlee Nimsegal's review of results. Pt states understanding and request results to be emailed to [email or web address].

## 2018-07-06 NOTE — Telephone Encounter (Signed)
-----   Message from Lenn SinkNorman S Regal, DPM sent at 07/05/2018  1:28 PM EST ----- Results were normal

## 2018-07-06 NOTE — Telephone Encounter (Signed)
Results have been emailed to pt.

## 2018-07-15 ENCOUNTER — Ambulatory Visit: Payer: Self-pay

## 2018-07-15 ENCOUNTER — Other Ambulatory Visit: Payer: Self-pay | Admitting: Pediatrics

## 2018-07-15 NOTE — Telephone Encounter (Signed)
Pt. Reports he has had back pain x 6 months. States he has a herniated disc in his thoracic spine. He was treated by "another doctor with a steroid and muscle relaxer and that helped. Request an appointment - appointment made for tomorrow. Instructed if symptoms worsen, to go to ED. Verbalizes understanding. Reason for Disposition . [1] MODERATE back pain (e.g., interferes with normal activities) AND [2] present > 3 days  Answer Assessment - Initial Assessment Questions 1. ONSET: "When did the pain begin?"      Started 6 months ago 2. LOCATION: "Where does it hurt?" (upper, mid or lower back)     Thoracic 3. SEVERITY: "How bad is the pain?"  (e.g., Scale 1-10; mild, moderate, or severe)   - MILD (1-3): doesn't interfere with normal activities    - MODERATE (4-7): interferes with normal activities or awakens from sleep    - SEVERE (8-10): excruciating pain, unable to do any normal activities      7 4. PATTERN: "Is the pain constant?" (e.g., yes, no; constant, intermittent)       Constant 5. RADIATION: "Does the pain shoot into your legs or elsewhere?"     Right shoulder 6. CAUSE:  "What do you think is causing the back pain?"      He was swinging an axe 7. BACK OVERUSE:  "Any recent lifting of heavy objects, strenuous work or exercise?"     No 8. MEDICATIONS: "What have you taken so far for the pain?" (e.g., nothing, acetaminophen, NSAIDS)     Muscle relaxer 9. NEUROLOGIC SYMPTOMS: "Do you have any weakness, numbness, or problems with bowel/bladder control?"     No 10. OTHER SYMPTOMS: "Do you have any other symptoms?" (e.g., fever, abdominal pain, burning with urination, blood in urine)       Hard to take a deep breath 11. PREGNANCY: "Is there any chance you are pregnant?" (e.g., yes, no; LMP)       No  Protocols used: BACK PAIN-A-AH

## 2018-07-15 NOTE — Progress Notes (Signed)
Chief Complaint  Patient presents with  . Back Pain    x 6 months - worsening. Mid back. Pain is explained as dull achy at times to sharp stabbing. Injured swinging an ax in May 2019. See Dr Artis Flock, Flexiril and Ibuprofen PRN    HPI: Alan Brooks 42 y.o. come in for sda   PCP NA   Until next week.  Has been battling lower  Thoracic spine pain  fopr months  And  Had a number of interventions   Describes  2 pains :  msuculear   Right more than  left    And other pain midline   .  Last seen dr Artis Flock  For musc spasm   Under care for pain in thoracic spine  Disc disease for the past  3-4 months.  Had  protrucing disc and has seen ortho?  Has had dry needling  With some help.  Using tens  Without help .    Using  [place]   As per pt.  And then saw    Ortho who said wait and see .   Doing karate.    Recently  Had shot top of foot  .and when given   Prednisone and  Boot.  May have helped  Back pain  But now off  For 2 days on taper and    Painback in middle o back and  Feels like hard to breath at timies   Taking a deep breath but no numbness  . somwhat concerning  afternoon .    ROS: See pertinent positives and negatives per HPI. No fever  Neg fam hx   Past Medical History:  Diagnosis Date  . Allergy   . Anxiety   . Asthma     Family History  Problem Relation Age of Onset  . Heart attack Maternal Grandfather   . Heart attack Paternal Grandfather   . Asthma Brother   . Depression Other        entire family   . Anxiety disorder Other        entire family     Social History   Socioeconomic History  . Marital status: Single    Spouse name: Not on file  . Number of children: Not on file  . Years of education: Not on file  . Highest education level: Not on file  Occupational History  . Not on file  Social Needs  . Financial resource strain: Not on file  . Food insecurity:    Worry: Not on file    Inability: Not on file  . Transportation needs:    Medical: Not on file   Non-medical: Not on file  Tobacco Use  . Smoking status: Former Smoker    Packs/day: 1.00    Years: 15.00    Pack years: 15.00    Types: Cigarettes  . Smokeless tobacco: Never Used  Substance and Sexual Activity  . Alcohol use: No  . Drug use: No  . Sexual activity: Not on file  Lifestyle  . Physical activity:    Days per week: Not on file    Minutes per session: Not on file  . Stress: Not on file  Relationships  . Social connections:    Talks on phone: Not on file    Gets together: Not on file    Attends religious service: Not on file    Active member of club or organization: Not on file    Attends meetings of  clubs or organizations: Not on file    Relationship status: Not on file  Other Topics Concern  . 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 BREO ELLIPTA 200-25 MCG/INH AEPB INHALE 1 PUFF INTO THE LUNGS DAILY. 60 each 3  . citalopram (CELEXA) 40 MG tablet TAKE 1 TABLET BY MOUTH ONCE DAILY 90 tablet 2  . diclofenac (VOLTAREN) 75 MG EC tablet Take 1 tablet (75 mg total) by mouth 2 (two) times daily. 50 tablet 2  . fluocinolone (SYNALAR) 0.025 % ointment APPLY TO AFFECTED AREA OF THE GROIN TWICE DAILY FOR 2 WEEKS  0  . hydrocortisone cream 1 % Apply 1 application topically 2 (two) times daily. 30 g 0  . ibuprofen (ADVIL,MOTRIN) 800 MG tablet Take 1 tablet (800 mg total) by mouth every 8 (eight) hours as needed. 30 tablet 0  . pantoprazole (PROTONIX) 40 MG tablet TAKE 1 TABLET BY MOUTH ONCE DAILY BEFORE BREAKFAST 90 tablet 2  . predniSONE (DELTASONE) 10 MG tablet 12 day tapering dose 48 tablet 0  . VENTOLIN HFA 108 (90 Base) MCG/ACT inhaler INHALE 2 PUFFS BY MOUTH INTO THE LUNGS EVERY 6 HOURS AS NEEDED FOR WHEEZING OR SHORTNESS OF BREATH 18 g 0  . cyclobenzaprine (FLEXERIL) 10 MG tablet Take 1  tablet (10 mg total) by mouth 3 (three) times daily as needed for muscle spasms. (Patient not taking: Reported on 07/16/2018) 30 tablet 0   No facility-administered medications prior to visit.      EXAM:  BP 118/72 (BP Location: Right Arm, Patient Position: Sitting, Cuff Size: Normal)   Pulse (!) 107   Temp 98 F (36.7 C) (Oral)   Wt 207 lb (93.9 kg)   SpO2 94%   BMI 27.31 kg/m   Body mass index is 27.31 kg/m.  GENERAL: vitals reviewed and listed above, alert, oriented, appears well hydrated and in no acute distress HEENT: atraumatic, conjunctiva  clear, no obvious abnormalities on inspection of external nose and ears OP : no lesion edema or exudate  NECK: no obvious masses on inspection palpation  LUNGS: clear to auscultation bilaterally, no wheezes, rales or rhonchi, good air movement Pain :midline about T9-10 and to right  No skin lesion  CV: HRRR, no clubbing cyanosis or  peripheral edema nl cap refill  MS: moves all extremities without noticeable focal  abnormality PSYCH: pleasant and cooperative, no obvious depression or anxiety Lab Results  Component Value Date   WBC 5.5 09/25/2017   HGB 15.7 09/25/2017   HCT 46.2 09/25/2017   PLT 196.0 09/25/2017   GLUCOSE 85 09/25/2017   CHOL 178 09/25/2017   TRIG 53.0 09/25/2017   HDL 49.90 09/25/2017   LDLCALC 118 (H) 09/25/2017   ALT 27 09/25/2017   AST 22 09/25/2017   NA 140 09/25/2017   K 4.2 09/25/2017   CL 103 09/25/2017   CREATININE 1.13 09/25/2017   BUN 15 09/25/2017   CO2 31 09/25/2017   TSH 1.58 09/25/2017   HGBA1C 5.7 09/25/2017   BP Readings from Last 3 Encounters:  07/16/18 118/72  06/28/18 (!) 142/84  06/11/18 118/78    ASSESSMENT AND PLAN:  Discussed the following assessment and plan:  Thoracic disc herniation - Plan: Ambulatory referral to Neurosurgery Back pain and thoracic disc  protusion seen by pcp  Ortho and  Another acute   appt 2 weeks ago and now on sda appt today   Had some response to  pred  And can  rx for now temporizes   Needs to see  Specialist  and  Can get second options from neurosurgery   For next steps  .  He has had this problem for 6 months and still problematic .      Risk benefit of medication discussed.    Will do referral for ns opinion -Patient advised to return or notify health care team  if  new concerns arise.  Patient Instructions  Restarting some prednisone  And  Flexeril with caution.   Will do a referral to neurosurgery  For second opinion.     So you will be   notified about  An appt.   Neta MendsWanda K. Deania Siguenza M.D.

## 2018-07-15 NOTE — Telephone Encounter (Signed)
Noted  

## 2018-07-16 ENCOUNTER — Encounter: Payer: Self-pay | Admitting: Internal Medicine

## 2018-07-16 ENCOUNTER — Ambulatory Visit: Payer: BLUE CROSS/BLUE SHIELD | Admitting: Internal Medicine

## 2018-07-16 VITALS — BP 118/72 | HR 107 | Temp 98.0°F | Wt 207.0 lb

## 2018-07-16 DIAGNOSIS — M5124 Other intervertebral disc displacement, thoracic region: Secondary | ICD-10-CM | POA: Diagnosis not present

## 2018-07-16 MED ORDER — METHOCARBAMOL 500 MG PO TABS: 500.0000 mg | ORAL_TABLET | Freq: Three times a day (TID) | ORAL | 1 refills | Status: DC | PRN

## 2018-07-16 MED ORDER — PREDNISONE 10 MG PO TABS: ORAL_TABLET | ORAL | 0 refills | Status: DC

## 2018-07-16 MED FILL — predniSONE 10 MG TABS: 10 | 12 days supply | Qty: 40 | Fill #0

## 2018-07-16 MED FILL — METHOCARBAMOL 500 MG TABLET: 500 | 5 days supply | Qty: 30 | Fill #0

## 2018-07-16 NOTE — Patient Instructions (Addendum)
Restarting some prednisone  And  Flexeril with caution.   Will do a referral to neurosurgery  For second opinion.     So you will be   notified about  An appt.

## 2018-07-19 ENCOUNTER — Encounter: Payer: Self-pay | Admitting: Podiatry

## 2018-07-19 ENCOUNTER — Ambulatory Visit: Payer: BLUE CROSS/BLUE SHIELD | Admitting: Podiatry

## 2018-07-19 DIAGNOSIS — M779 Enthesopathy, unspecified: Secondary | ICD-10-CM | POA: Diagnosis not present

## 2018-07-19 DIAGNOSIS — M7671 Peroneal tendinitis, right leg: Secondary | ICD-10-CM

## 2018-07-20 ENCOUNTER — Other Ambulatory Visit: Payer: Self-pay | Admitting: Adult Health

## 2018-07-20 MED FILL — METHOCARBAMOL 500 MG TABLET: 500 | 5 days supply | Qty: 30 | Fill #1

## 2018-07-20 MED FILL — PANTOPRAZOLE SOD DR 40 MG T: 40 | 90 days supply | Qty: 90 | Fill #2

## 2018-07-20 NOTE — Progress Notes (Signed)
Subjective:   Patient ID: Alan Brooks, male   DOB: 42 y.o.   MRN: [id number]   HPI Patient presents stating the outside of the right foot has been very sore but it seems to be improving and the immobilization seems to be helping me.  I have not yet tested   ROS      Objective:  Physical Exam  No vascular status intact with patient having some bruising on the dorsal lateral aspect of the right foot but overall it appears to be improved from where it was previously with no current deep discomfort when palpated     Assessment:  Hopeful improvement from midfoot sprain right with continued immobilization recommended     Plan:  H&P condition reviewed and discussed.  At this point I have recommended continued immobilization ice therapy and stretching and gradually increase in activity.  If symptoms were to recur work and the need to get an MRI to try to understand better the pathology present

## 2018-07-21 MED FILL — CITALOPRAM HBR 40 MG TABLET: 40 | 90 days supply | Qty: 90 | Fill #0

## 2018-07-21 NOTE — Telephone Encounter (Signed)
Sent to the pharmacy by e-scribe for 90 days. 

## 2018-07-23 MED FILL — BREO ELLIPTA 200-25 MCG INH: 200-25 | 30 days supply | Qty: 60 | Fill #2

## 2018-07-26 NOTE — Telephone Encounter (Signed)
Spoke to pt and he stated that referral was not placed! I called referred office and they advised they have not seen any referrals. Our referral department resent the the referral. Will inform pt of update.

## 2018-08-09 DIAGNOSIS — M5489 Other dorsalgia: Secondary | ICD-10-CM | POA: Diagnosis not present

## 2018-08-09 DIAGNOSIS — Z6827 Body mass index (BMI) 27.0-27.9, adult: Secondary | ICD-10-CM | POA: Diagnosis not present

## 2018-08-09 DIAGNOSIS — L400 Psoriasis vulgaris: Secondary | ICD-10-CM | POA: Diagnosis not present

## 2018-08-09 DIAGNOSIS — L700 Acne vulgaris: Secondary | ICD-10-CM | POA: Diagnosis not present

## 2018-08-09 MED FILL — CLINDAMYCIN PH 1% SOLUTION: 1 | 60 days supply | Qty: 60 | Fill #0

## 2018-08-31 ENCOUNTER — Other Ambulatory Visit: Payer: Self-pay | Admitting: Adult Health

## 2018-08-31 DIAGNOSIS — Z760 Encounter for issue of repeat prescription: Secondary | ICD-10-CM

## 2018-08-31 DIAGNOSIS — J4540 Moderate persistent asthma, uncomplicated: Secondary | ICD-10-CM

## 2018-08-31 MED FILL — BREO ELLIPTA 200-25 MCG INH: 200-25 | 30 days supply | Qty: 60 | Fill #3

## 2018-09-01 NOTE — Telephone Encounter (Signed)
Denied.  FILLED FOR FOUR MONTHS ON 06/03/2018.

## 2018-10-04 ENCOUNTER — Other Ambulatory Visit: Payer: Self-pay | Admitting: Adult Health

## 2018-10-04 DIAGNOSIS — J4540 Moderate persistent asthma, uncomplicated: Secondary | ICD-10-CM

## 2018-10-04 DIAGNOSIS — Z760 Encounter for issue of repeat prescription: Secondary | ICD-10-CM

## 2018-10-04 MED FILL — CLOBETASOL 0.05% SHAMPOO: 0.05 | 30 days supply | Qty: 118 | Fill #1

## 2018-10-04 MED FILL — CLOBETASOL 0.05% SOLUTION: 0.05 | 25 days supply | Qty: 50 | Fill #1

## 2018-10-05 ENCOUNTER — Encounter: Payer: Self-pay | Admitting: Family Medicine

## 2018-10-05 MED FILL — BREO ELLIPTA 200-25 MCG INH: 200-25 | 30 days supply | Qty: 60 | Fill #0

## 2018-10-05 NOTE — Telephone Encounter (Signed)
Sent to the pharmacy for 1 month.  Pt due for cpx and lab work.  Letter sent by mail.

## 2018-10-18 ENCOUNTER — Other Ambulatory Visit: Payer: Self-pay | Admitting: Adult Health

## 2018-10-19 MED FILL — CITALOPRAM HBR 40 MG TABLET: 40 | 90 days supply | Qty: 90 | Fill #0

## 2018-10-19 MED FILL — PANTOPRAZOLE SOD DR 40 MG T: 40 | 90 days supply | Qty: 90 | Fill #0

## 2018-10-19 NOTE — Telephone Encounter (Signed)
Sent to the pharmacy by e-scribe.  Pt has cpx on 10/22/2018.

## 2018-10-22 ENCOUNTER — Ambulatory Visit (INDEPENDENT_AMBULATORY_CARE_PROVIDER_SITE_OTHER): Payer: BLUE CROSS/BLUE SHIELD | Admitting: Adult Health

## 2018-10-22 ENCOUNTER — Encounter: Payer: Self-pay | Admitting: Adult Health

## 2018-10-22 VITALS — BP 114/72 | Temp 98.1°F | Ht 73.25 in | Wt 209.0 lb

## 2018-10-22 DIAGNOSIS — Z114 Encounter for screening for human immunodeficiency virus [HIV]: Secondary | ICD-10-CM | POA: Diagnosis not present

## 2018-10-22 DIAGNOSIS — Z0000 Encounter for general adult medical examination without abnormal findings: Secondary | ICD-10-CM | POA: Diagnosis not present

## 2018-10-22 DIAGNOSIS — J4520 Mild intermittent asthma, uncomplicated: Secondary | ICD-10-CM | POA: Diagnosis not present

## 2018-10-22 DIAGNOSIS — F411 Generalized anxiety disorder: Secondary | ICD-10-CM | POA: Diagnosis not present

## 2018-10-22 LAB — CBC WITH DIFFERENTIAL/PLATELET
Basophils Absolute: 0.1 10*3/uL (ref 0.0–0.1)
Basophils Relative: 1.2 % (ref 0.0–3.0)
Eosinophils Absolute: 0.3 10*3/uL (ref 0.0–0.7)
Eosinophils Relative: 4.2 % (ref 0.0–5.0)
HCT: 46.6 % (ref 39.0–52.0)
Hemoglobin: 15.5 g/dL (ref 13.0–17.0)
Lymphocytes Relative: 28.9 % (ref 12.0–46.0)
Lymphs Abs: 1.8 10*3/uL (ref 0.7–4.0)
MCHC: 33.2 g/dL (ref 30.0–36.0)
MCV: 94.1 fl (ref 78.0–100.0)
Monocytes Absolute: 0.5 10*3/uL (ref 0.1–1.0)
Monocytes Relative: 7.1 % (ref 3.0–12.0)
Neutro Abs: 3.7 10*3/uL (ref 1.4–7.7)
Neutrophils Relative %: 58.6 % (ref 43.0–77.0)
Platelets: 225 10*3/uL (ref 150.0–400.0)
RBC: 4.96 Mil/uL (ref 4.22–5.81)
RDW: 13.8 % (ref 11.5–15.5)
WBC: 6.3 10*3/uL (ref 4.0–10.5)

## 2018-10-22 LAB — COMPREHENSIVE METABOLIC PANEL
ALT: 19 U/L (ref 0–53)
AST: 18 U/L (ref 0–37)
Albumin: 4.5 g/dL (ref 3.5–5.2)
Alkaline Phosphatase: 57 U/L (ref 39–117)
BUN: 15 mg/dL (ref 6–23)
CO2: 30 mEq/L (ref 19–32)
Calcium: 9.3 mg/dL (ref 8.4–10.5)
Chloride: 103 mEq/L (ref 96–112)
Creatinine, Ser: 1.1 mg/dL (ref 0.40–1.50)
GFR: 73.85 mL/min (ref >60.00)
Glucose, Bld: 80 mg/dL (ref 70–99)
Potassium: 4.3 mEq/L (ref 3.5–5.1)
Sodium: 141 mEq/L (ref 135–145)
Total Bilirubin: 0.8 mg/dL (ref 0.2–1.2)
Total Protein: 6.5 g/dL (ref 6.0–8.3)

## 2018-10-22 LAB — HEPATIC FUNCTION PANEL
ALT: 19 U/L (ref 0–53)
AST: 18 U/L (ref 0–37)
Albumin: 4.5 g/dL (ref 3.5–5.2)
Alkaline Phosphatase: 57 U/L (ref 39–117)
Bilirubin, Direct: 0.2 mg/dL (ref 0.0–0.3)
Total Bilirubin: 0.8 mg/dL (ref 0.2–1.2)
Total Protein: 6.5 g/dL (ref 6.0–8.3)

## 2018-10-22 LAB — TSH: TSH: 0.78 u[IU]/mL (ref 0.35–4.50)

## 2018-10-22 MED ORDER — FLUTICASONE FUROATE-VILANTEROL 100-25 MCG/INH IN AEPB: 1.0000 | INHALATION_SPRAY | Freq: Every day | RESPIRATORY_TRACT | 3 refills | Status: AC

## 2018-10-22 MED FILL — BREO ELLIPTA 100-25 MCG INH: 100-25 | 30 days supply | Qty: 60 | Fill #0

## 2018-10-22 NOTE — Progress Notes (Signed)
Subjective:    Patient ID: Alan Brooks, male    DOB: 1976/04/16, 43 y.o.   MRN: [id number]  HPI Patient presents for yearly preventative medicine examination. Pleasant 43 year old male who  has a past medical history of Allergy, Anxiety, and Asthma.   Anxiety - Well controlled with Celexa 40 mg   GERD - Well controlled with Protonix and diet   Asthma - Well controlled with Breo. He would like to decrease dose.   All immunizations and health maintenance protocols were reviewed with the patient and needed orders were placed. UTD  Appropriate screening laboratory values were ordered for the patient including screening of hyperlipidemia, renal function and hepatic function.  Medication reconciliation,  past medical history, social history, problem list and allergies were reviewed in detail with the patient  Goals were established with regard to weight loss, exercise, and  diet in compliance with medications. He is back to working out and doing martial arts.   No longer having a lot of back pain after he was started on prednisone therapy by orthopedics for disk herniation    Review of Systems  Constitutional: Negative.   HENT: Negative.   Eyes: Negative.   Respiratory: Negative.   Cardiovascular: Negative.   Gastrointestinal: Negative.   Endocrine: Negative.   Genitourinary: Negative.   Musculoskeletal: Negative.   Allergic/Immunologic: Negative.   Neurological: Negative.   Hematological: Negative.   Psychiatric/Behavioral: Negative.   All other systems reviewed and are negative.  Past Medical History:  Diagnosis Date  . Allergy   . Anxiety   . Asthma     Social History   Socioeconomic History  . Marital status: Single    Spouse name: Not on file  . Number of children: Not on file  . Years of education: Not on file  . Highest education level: Not on file  Occupational History  . Not on file  Social Needs  . Financial resource strain: Not on file  . Food  insecurity:    Worry: Not on file    Inability: Not on file  . Transportation needs:    Medical: Not on file    Non-medical: Not on file  Tobacco Use  . Smoking status: Former Smoker    Packs/day: 1.00    Years: 15.00    Pack years: 15.00    Types: Cigarettes  . Smokeless tobacco: Never Used  Substance and Sexual Activity  . Alcohol use: No  . Drug use: No  . Sexual activity: Not on file  Lifestyle  . Physical activity:    Days per week: Not on file    Minutes per session: Not on file  . Stress: Not on file  Relationships  . Social connections:    Talks on phone: Not on file    Gets together: Not on file    Attends religious service: Not on file    Active member of club or organization: Not on file    Attends meetings of clubs or organizations: Not on file    Relationship status: Not on file  . Intimate partner violence:    Fear of current or ex partner: Not on file    Emotionally abused: Not on file    Physically abused: Not on file    Forced sexual activity: Not on file  Other Topics Concern  . 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 No pertinent surgical history.  Family History  Problem Relation Age of Onset  . Heart attack Maternal Grandfather   . Heart attack Paternal Grandfather   . Asthma Brother   . Depression Other        entire family   . Anxiety disorder Other        entire family     No Known Allergies  Current Outpatient Medications on File Prior to Visit  Medication Sig Dispense Refill  . BREO ELLIPTA 200-25 MCG/INH AEPB INHALE 1 PUFF INTO THE LUNGS DAILY. 60 each 0  . citalopram (CELEXA) 40 MG tablet TAKE 1 TABLET BY MOUTH ONCE DAILY 90 tablet 0  . hydrocortisone cream 1 % Apply 1 application topically 2 (two) times daily. 30 g 0  . ibuprofen (ADVIL,MOTRIN) 800 MG tablet Take 1 tablet (800 mg total) by mouth  every 8 (eight) hours as needed. 30 tablet 0  . pantoprazole (PROTONIX) 40 MG tablet TAKE 1 TABLET BY MOUTH ONCE DAILY BEFORE BREAKFAST 90 tablet 0  . VENTOLIN HFA 108 (90 Base) MCG/ACT inhaler INHALE 2 PUFFS BY MOUTH INTO THE LUNGS EVERY 6 HOURS AS NEEDED FOR WHEEZING OR SHORTNESS OF BREATH 18 g 0   No current facility-administered medications on file prior to visit.     BP 114/72   Temp 98.1 F (36.7 C)   Ht 6' 1.25" (1.861 m)   Wt 209 lb (94.8 kg)   BMI 27.39 kg/m       Objective:   Physical Exam Vitals signs and nursing note reviewed.  Constitutional:      General: He is not in acute distress.    Appearance: He is well-developed and normal weight. He is not diaphoretic.  HENT:     Head: Normocephalic and atraumatic.     Right Ear: Tympanic membrane, ear canal and external ear normal. There is no impacted cerumen.     Left Ear: Tympanic membrane, ear canal and external ear normal. There is no impacted cerumen.     Nose: Nose normal.     Mouth/Throat:     Mouth: Mucous membranes are moist.     Pharynx: Oropharynx is clear. No oropharyngeal exudate.  Eyes:     General:        Right eye: No discharge.        Left eye: No discharge.     Conjunctiva/sclera: Conjunctivae normal.     Pupils: Pupils are equal, round, and reactive to light.  Neck:     Thyroid: No thyromegaly.     Trachea: No tracheal deviation.  Cardiovascular:     Rate and Rhythm: Normal rate and regular rhythm.     Pulses: Normal pulses.     Heart sounds: Normal heart sounds. No murmur. No friction rub. No gallop.   Pulmonary:     Effort: Pulmonary effort is normal. No respiratory distress.     Breath sounds: Normal breath sounds. No stridor. No wheezing, rhonchi or rales.  Chest:     Chest wall: No tenderness.  Abdominal:     General: Bowel sounds are normal. There is no distension.     Palpations: Abdomen is soft. There is no mass.     Tenderness: There is no abdominal tenderness. There is no right  CVA tenderness, left CVA tenderness, guarding or rebound.     Hernia: No hernia is present.  Musculoskeletal: Normal range of motion.  General: No swelling, tenderness, deformity or signs of injury.     Right lower leg: No edema.     Left lower leg: No edema.  Lymphadenopathy:     Cervical: No cervical adenopathy.  Skin:    General: Skin is warm and dry.     Capillary Refill: Capillary refill takes less than 2 seconds.     Coloration: Skin is not jaundiced or pale.     Findings: No bruising, erythema, lesion or rash.  Neurological:     General: No focal deficit present.     Mental Status: He is alert and oriented to person, place, and time. Mental status is at baseline.     Cranial Nerves: No cranial nerve deficit.     Coordination: Coordination normal.  Psychiatric:        Mood and Affect: Mood normal.        Behavior: Behavior normal.        Thought Content: Thought content normal.        Judgment: Judgment normal.       Assessment & Plan:  1. Routine general medical examination at a health care facility - Follow up in one year or sooner if needed - CBC with Differential/Platelet - Comprehensive metabolic panel - Hepatic function panel - TSH  2. Anxiety state - Continue with Celexa   3. Mild intermittent asthma without complication - Will decrease to 100-25 mcg - fluticasone furoate-vilanterol (BREO ELLIPTA) 100-25 MCG/INH AEPB; Inhale 1 puff into the lungs daily.  Dispense: 60 each; Refill: 3  4. Encounter for screening for HIV  - HIV Antibody (routine testing w rflx)  Shirline Frees, NP

## 2018-10-23 LAB — HIV ANTIBODY (ROUTINE TESTING W REFLEX): HIV 1&2 Ab, 4th Generation: NONREACTIVE

## 2018-11-12 MED FILL — BREO ELLIPTA 100-25 MCG INH: 100-25 | 30 days supply | Qty: 60 | Fill #1

## 2018-11-25 ENCOUNTER — Encounter: Payer: Self-pay | Admitting: Adult Health

## 2018-12-14 ENCOUNTER — Other Ambulatory Visit: Payer: Self-pay | Admitting: Adult Health

## 2018-12-14 MED FILL — BREO ELLIPTA 100-25 MCG INH: 100-25 | 30 days supply | Qty: 60 | Fill #2

## 2018-12-14 NOTE — Telephone Encounter (Signed)
Sent to the pharmacy by e-scribe. 

## 2018-12-15 MED FILL — CICLOPIROX 1% SHAMPOO: 1 | 30 days supply | Qty: 120 | Fill #1

## 2018-12-15 MED FILL — CLOBETASOL 0.05% SHAMPOO: 0.05 | 30 days supply | Qty: 118 | Fill #2

## 2018-12-23 ENCOUNTER — Encounter: Payer: Self-pay | Admitting: Adult Health

## 2018-12-24 ENCOUNTER — Other Ambulatory Visit: Payer: Self-pay | Admitting: Adult Health

## 2018-12-24 DIAGNOSIS — J4540 Moderate persistent asthma, uncomplicated: Secondary | ICD-10-CM

## 2018-12-24 DIAGNOSIS — Z760 Encounter for issue of repeat prescription: Secondary | ICD-10-CM

## 2018-12-24 MED ORDER — FLUTICASONE FUROATE-VILANTEROL 200-25 MCG/INH IN AEPB: 1.0000 | INHALATION_SPRAY | Freq: Every day | RESPIRATORY_TRACT | 3 refills | Status: DC

## 2018-12-24 MED FILL — BREO ELLIPTA 200-25 MCG INH: 200-25 | 30 days supply | Qty: 60 | Fill #0

## 2019-01-04 ENCOUNTER — Other Ambulatory Visit: Payer: Self-pay | Admitting: Adult Health

## 2019-01-04 MED FILL — CITALOPRAM HBR 40 MG TABLET: 40 | 90 days supply | Qty: 90 | Fill #0

## 2019-01-04 MED FILL — PANTOPRAZOLE SOD DR 40 MG T: 40 | 90 days supply | Qty: 90 | Fill #0

## 2019-01-05 MED FILL — VENTOLIN HFA 90 MCG INHALER: 108 (90 BAS | 25 days supply | Qty: 18 | Fill #0

## 2019-01-05 NOTE — Telephone Encounter (Signed)
Sent to the pharmacy by e-scribe. 

## 2019-01-26 IMAGING — DX DG RIBS W/ CHEST 3+V*L*
4 series · 4 of 4 positions shown · non-contrast
Comparison: 06/13/2014

CLINICAL DATA: anterior chest with localized tenderness just
underneath the br[REDACTED] (kickboxing--kicked twice in same area 6
days ago). no ecchhymosis or erythema. recently thought he had some
difficulty with breathing.

EXAM:
LEFT RIBS AND CHEST - 3+ VIEW

[chest pa]
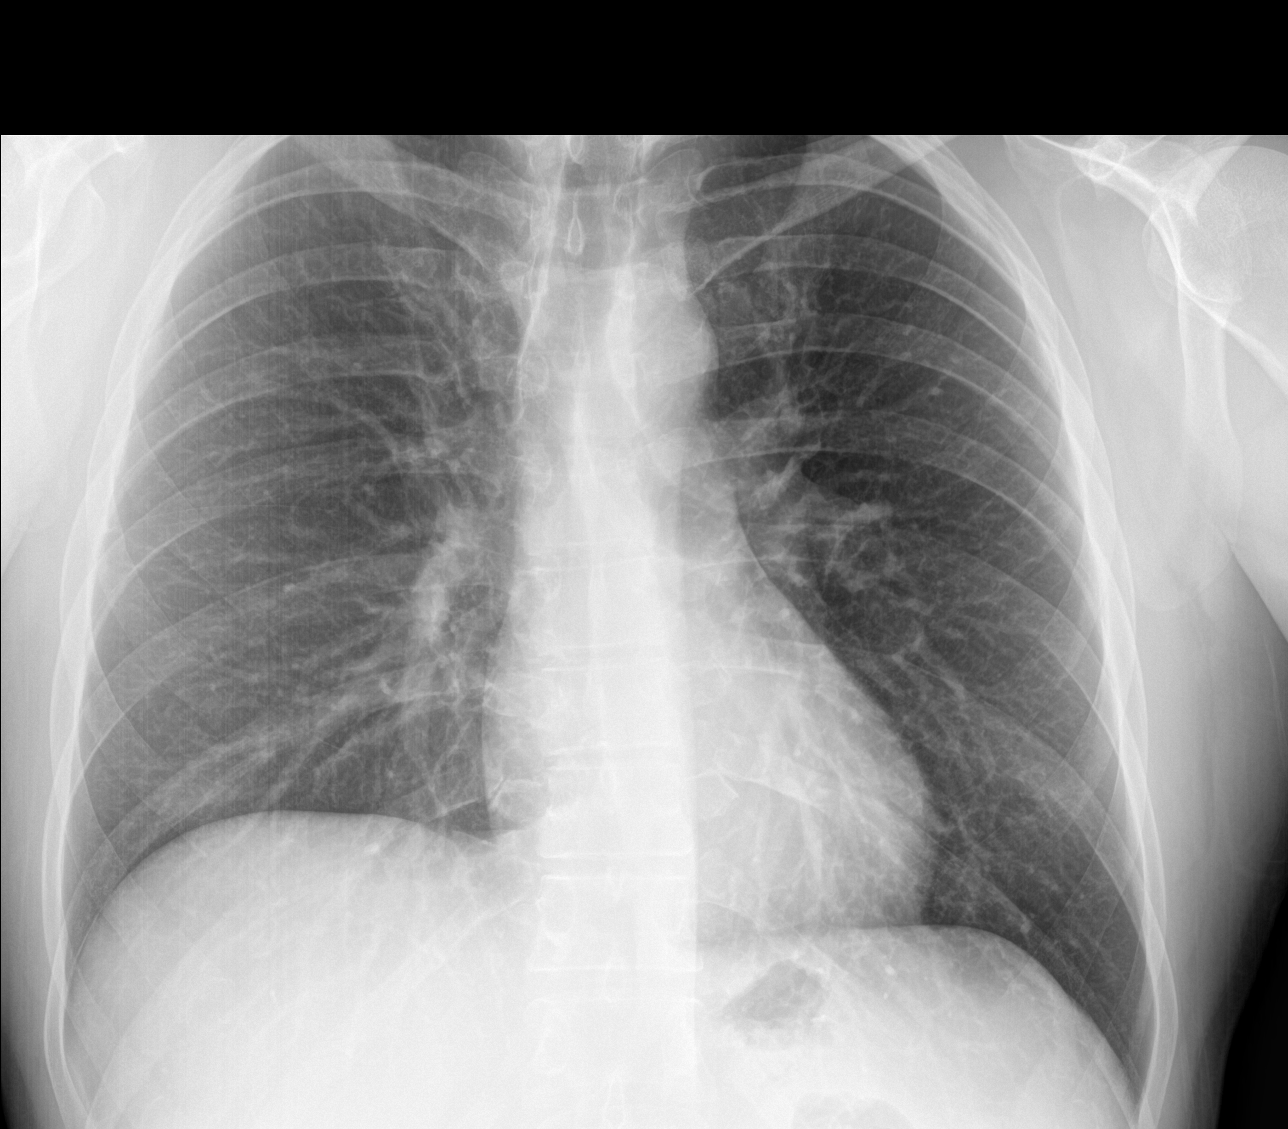

[rib pa]
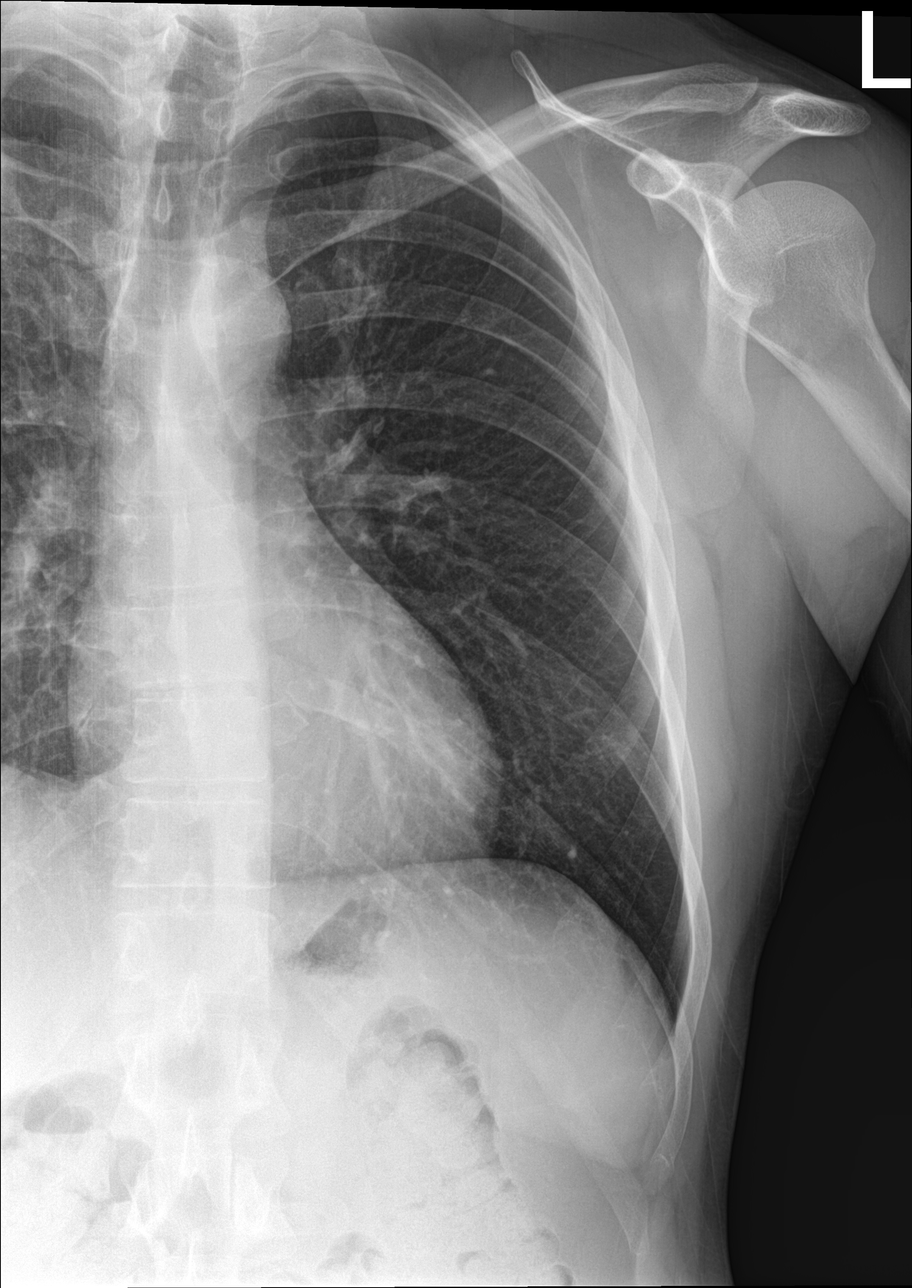

[rib obl (1 of 2)]
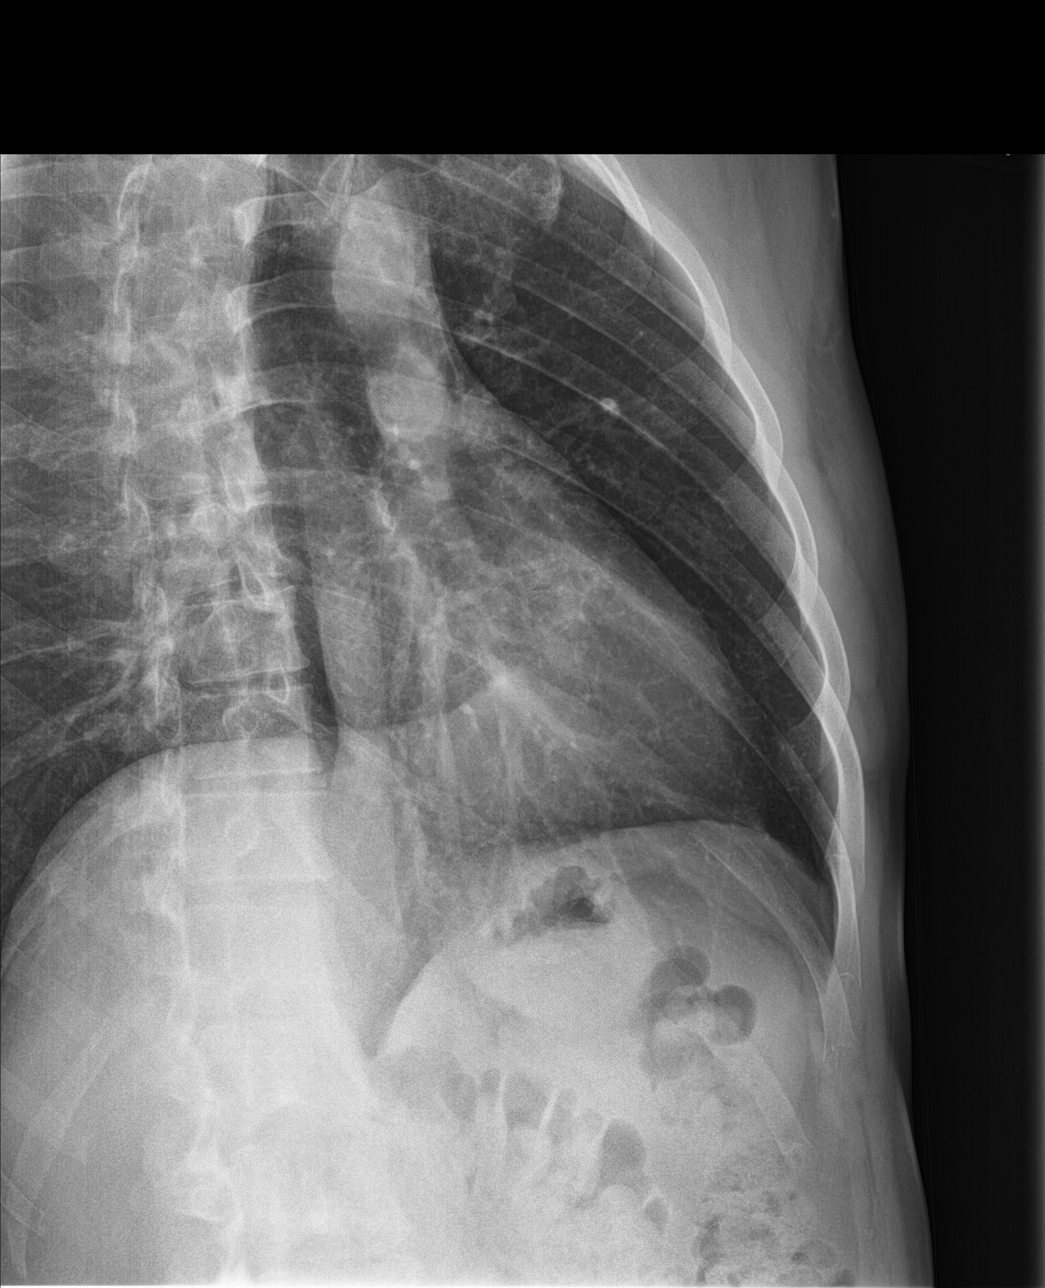

[rib obl (2 of 2)]
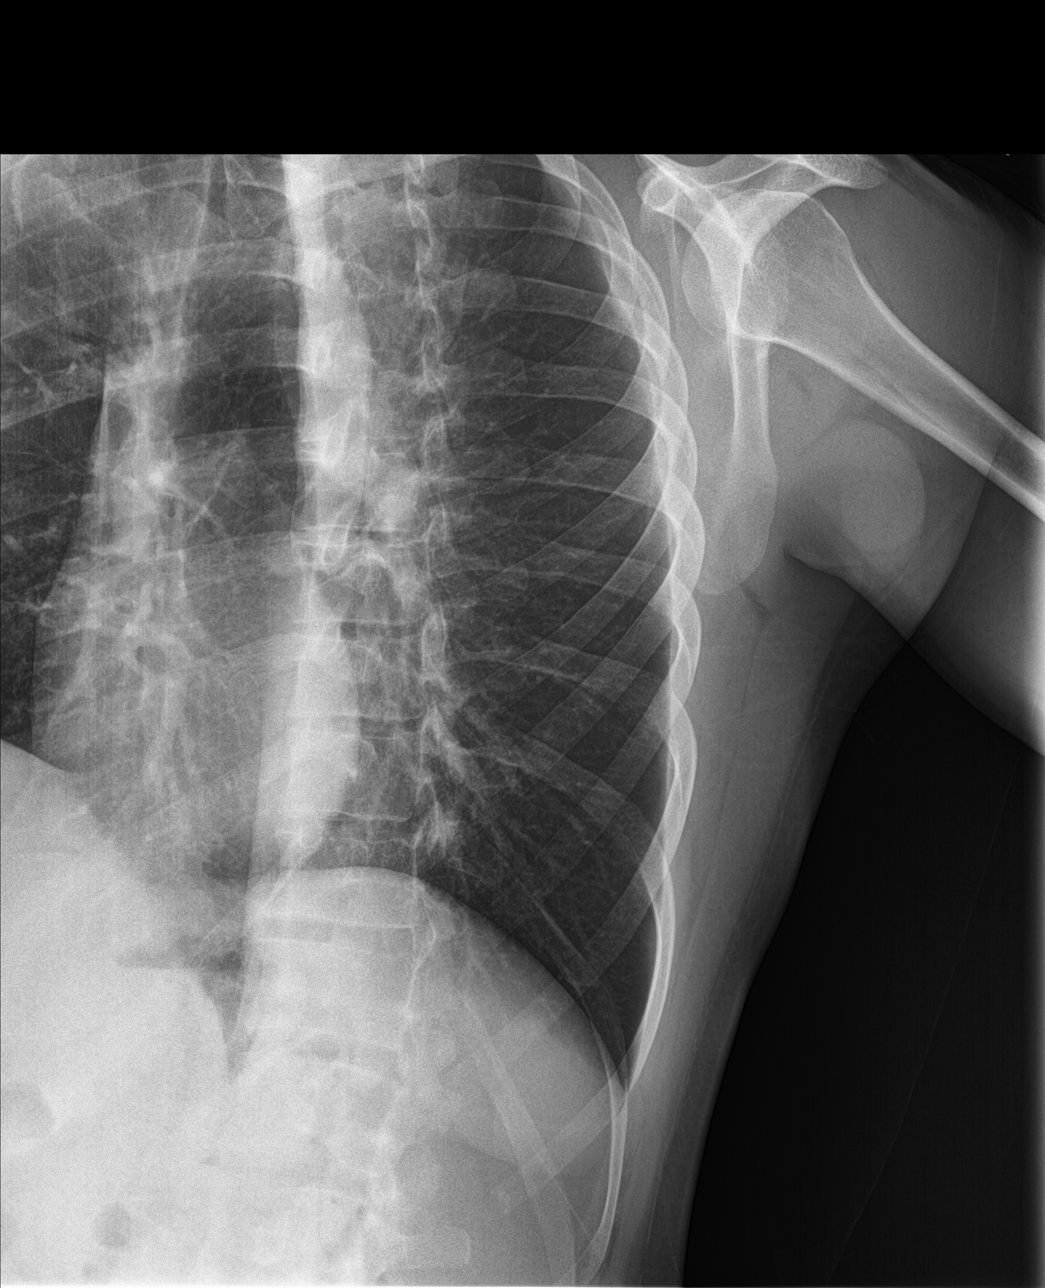

[4 of 4 positions shown; findings below may reference images not displayed]

FINDINGS: No fracture or other bone lesions are seen involving the ribs. There
is no evidence of pneumothorax or pleural effusion. Both lungs are
clear. Heart size and mediastinal contours are within normal limits.
IMPRESSION: Negative.

## 2019-01-31 ENCOUNTER — Encounter: Payer: Self-pay | Admitting: Adult Health

## 2019-02-01 ENCOUNTER — Other Ambulatory Visit: Payer: Self-pay

## 2019-02-01 ENCOUNTER — Encounter: Payer: Self-pay | Admitting: Adult Health

## 2019-02-01 ENCOUNTER — Ambulatory Visit (INDEPENDENT_AMBULATORY_CARE_PROVIDER_SITE_OTHER): Payer: BC Managed Care – PPO | Admitting: Adult Health

## 2019-02-01 DIAGNOSIS — M722 Plantar fascial fibromatosis: Secondary | ICD-10-CM

## 2019-02-01 MED FILL — BREO ELLIPTA 200-25 MCG INH: 200-25 | 30 days supply | Qty: 60 | Fill #1

## 2019-02-01 NOTE — Progress Notes (Signed)
Virtual Visit via Video Note  I connected with Alan Brooks on 02/01/19 at  2:30 PM EDT by a video enabled telemedicine application and verified that I am speaking with the correct person using two identifiers.  Location patient: home Location provider:work or home office Persons participating in the virtual visit: patient, provider  I discussed the limitations of evaluation and management by telemedicine and the availability of in person appointments. The patient expressed understanding and agreed to proceed.   HPI: 43 year old male who is being evaluated today with the acute complaint of left foot pain.  Pain started approximately 6 weeks ago, at this time he was trying to break a new hiking boots and was walked approximately 5 miles.  Over the last 6 weeks pain has not gotten any better but has not gotten any worse either.  Pain is located on the bottom of his foot towards the heel.  Has not noticed any swelling or redness.  Pain is worse with ambulation as well as patient.  He has been icing and using Motrin sporadically without much improvement   ROS: See pertinent positives and negatives per HPI.  Past Medical History:  Diagnosis Date  . Allergy   . Anxiety   . Asthma     No past surgical history on file.  Family History  Problem Relation Age of Onset  . Heart attack Maternal Grandfather   . Heart attack Paternal Grandfather   . Asthma Brother   . Depression Other        entire family   . Anxiety disorder Other        entire family       Current Outpatient Medications:  .  citalopram (CELEXA) 40 MG tablet, TAKE 1 TABLET BY MOUTH ONCE DAILY, Disp: 90 tablet, Rfl: 1 .  fluticasone furoate-vilanterol (BREO ELLIPTA) 200-25 MCG/INH AEPB, Inhale 1 puff into the lungs daily., Disp: 60 each, Rfl: 3 .  hydrocortisone cream 1 %, Apply 1 application topically 2 (two) times daily., Disp: 30 g, Rfl: 0 .  ibuprofen (ADVIL,MOTRIN) 800 MG tablet, Take 1 tablet (800 mg total)  by mouth every 8 (eight) hours as needed., Disp: 30 tablet, Rfl: 0 .  pantoprazole (PROTONIX) 40 MG tablet, TAKE 1 TABLET BY MOUTH ONCE DAILY BEFORE BREAKFAST, Disp: 90 tablet, Rfl: 3 .  VENTOLIN HFA 108 (90 Base) MCG/ACT inhaler, INHALE 2 PUFFS BY MOUTH INTO THE LUNGS EVERY 6 HOURS AS NEEDED FOR WHEEZING OR SHORTNESS OF BREATH, Disp: 18 g, Rfl: 2  EXAM:  VITALS per patient if applicable:  GENERAL: alert, oriented, appears well and in no acute distress  HEENT: atraumatic, conjunttiva clear, no obvious abnormalities on inspection of external nose and ears  NECK: normal movements of the head and neck  LUNGS: on inspection no signs of respiratory distress, breathing rate appears normal, no obvious gross SOB, gasping or wheezing  CV: no obvious cyanosis  MS: moves all visible extremities without noticeable abnormality.  Both to flex and extend the left foot without difficulty.  Has normal range of motion in all his toes on his left foot.  PSYCH/NEURO: pleasant and cooperative, no obvious depression or anxiety, speech and thought processing grossly intact  ASSESSMENT AND PLAN:  Discussed the following assessment and plan:  Plantar fasciitis, left -Advised Motrin 600 mg 3 times daily, stretching exercises, as well as frequent icing throughout the day.  Follow-up if no improvement over the next week     I discussed the assessment and treatment plan with  the patient. The patient was provided an opportunity to ask questions and all were answered. The patient agreed with the plan and demonstrated an understanding of the instructions.   The patient was advised to call back or seek an in-person evaluation if the symptoms worsen or if the condition fails to improve as anticipated.   Dorothyann Peng, NP

## 2019-02-07 ENCOUNTER — Encounter: Payer: Self-pay | Admitting: Adult Health

## 2019-02-08 ENCOUNTER — Encounter: Payer: Self-pay | Admitting: Podiatry

## 2019-02-10 ENCOUNTER — Encounter: Payer: Self-pay | Admitting: Podiatry

## 2019-02-10 ENCOUNTER — Other Ambulatory Visit: Payer: Self-pay

## 2019-02-10 ENCOUNTER — Ambulatory Visit (INDEPENDENT_AMBULATORY_CARE_PROVIDER_SITE_OTHER): Payer: BC Managed Care – PPO

## 2019-02-10 ENCOUNTER — Ambulatory Visit: Payer: BC Managed Care – PPO | Admitting: Podiatry

## 2019-02-10 ENCOUNTER — Other Ambulatory Visit: Payer: Self-pay | Admitting: Podiatry

## 2019-02-10 VITALS — Temp 97.0°F

## 2019-02-10 DIAGNOSIS — M79672 Pain in left foot: Secondary | ICD-10-CM

## 2019-02-10 DIAGNOSIS — M722 Plantar fascial fibromatosis: Secondary | ICD-10-CM

## 2019-02-10 MED ORDER — METHYLPREDNISOLONE 4 MG PO TBPK: ORAL_TABLET | ORAL | 0 refills | Status: DC

## 2019-02-10 MED FILL — METHYLPREDNISOLONE 4 MG TBP: 4 | 21 days supply | Qty: 21 | Fill #0

## 2019-02-10 NOTE — Progress Notes (Signed)
Subjective:   Patient ID: Alan Brooks, male   DOB: 43 y.o.   MRN: [id number]   HPI Patient states his left heel has started to hurt severely and he does have his big boot from his right   ROS      Objective:  Physical Exam  Neurovascular status intact negative Homans sign noted with exquisite plantar fascial inflammation pain at the insertion tendon calcaneus and slightly distal.  Patient has good digital perfusion well oriented x3     Assessment:  Acute plantar fasciitis with inflammation fluid around the tendon     Plan:  H&P x-ray reviewed and did sterile prep and injected the fascia 3 mg Kenalog 5 mg Xylocaine and he will start boot usage.  Reappoint to recheck in the next several weeks  X-ray was negative for signs of stress fracture or indications of spur or arthritic pathology

## 2019-02-10 NOTE — Patient Instructions (Signed)

## 2019-02-24 ENCOUNTER — Ambulatory Visit: Payer: BC Managed Care – PPO | Admitting: Podiatry

## 2019-02-24 ENCOUNTER — Encounter: Payer: Self-pay | Admitting: Podiatry

## 2019-02-24 ENCOUNTER — Other Ambulatory Visit: Payer: Self-pay

## 2019-02-24 VITALS — Temp 97.9°F

## 2019-02-24 DIAGNOSIS — M722 Plantar fascial fibromatosis: Secondary | ICD-10-CM | POA: Diagnosis not present

## 2019-02-24 NOTE — Progress Notes (Signed)
Subjective:   Patient ID: Alan Brooks, male   DOB: 43 y.o.   MRN: [id number]   HPI Patient states he wore the boot for a couple weeks and it seems to be some improved but he still has some trouble with his heel   ROS      Objective:  Physical Exam  Neurovascular status intact with inflammation pain plantar fascial left at the insertion tendon calcaneus     Assessment:  Fasciitis improved but still having discomfort heel with boot having been helpful     Plan:  Reviewed gradual reduction of boot and today I did do sterile prep and reinjected the fascia 3 mg Kenalog 5 mg Xylocaine and advised on increased activities.  I did explain that it is possible that we will see reoccurrence and it may require other treatments in future

## 2019-02-25 MED FILL — BREO ELLIPTA 200-25 MCG INH: 200-25 | 30 days supply | Qty: 60 | Fill #2

## 2019-02-28 ENCOUNTER — Encounter: Payer: Self-pay | Admitting: Adult Health

## 2019-03-01 ENCOUNTER — Other Ambulatory Visit: Payer: Self-pay | Admitting: Adult Health

## 2019-03-01 MED ORDER — FLUTICASONE-SALMETEROL 250-50 MCG/DOSE IN AEPB: 1.0000 | INHALATION_SPRAY | Freq: Two times a day (BID) | RESPIRATORY_TRACT | 11 refills | Status: DC

## 2019-03-01 MED ORDER — FLUTICASONE FUROATE-VILANTEROL 200-25 MCG/INH IN AEPB: 1.0000 | INHALATION_SPRAY | Freq: Every day | RESPIRATORY_TRACT | 4 refills | Status: DC

## 2019-03-01 MED FILL — ADVAIR 250/50 DISKUS: 250-50 | 30 days supply | Qty: 60 | Fill #0

## 2019-03-02 ENCOUNTER — Other Ambulatory Visit: Payer: Self-pay | Admitting: Podiatry

## 2019-03-02 MED FILL — METHYLPREDNISOLONE 4 MG TBP: 4 | 6 days supply | Qty: 21 | Fill #0

## 2019-03-02 NOTE — Telephone Encounter (Signed)
Called Pt. Advising of his (medrol dosepak) refill sent to his pharmacy, Pt. Stated he never requested any refills and hanged up.

## 2019-03-02 NOTE — Telephone Encounter (Signed)
Dr. March Rummage as Dr. On call please advice, thank you

## 2019-03-03 ENCOUNTER — Encounter: Payer: Self-pay | Admitting: Podiatry

## 2019-03-10 ENCOUNTER — Other Ambulatory Visit: Payer: Self-pay

## 2019-03-10 ENCOUNTER — Ambulatory Visit: Payer: BC Managed Care – PPO | Admitting: Podiatry

## 2019-03-10 ENCOUNTER — Encounter: Payer: Self-pay | Admitting: Podiatry

## 2019-03-10 ENCOUNTER — Ambulatory Visit: Payer: BC Managed Care – PPO | Admitting: Orthotics

## 2019-03-10 VITALS — Temp 98.0°F

## 2019-03-10 DIAGNOSIS — M722 Plantar fascial fibromatosis: Secondary | ICD-10-CM | POA: Diagnosis not present

## 2019-03-10 NOTE — Progress Notes (Signed)

## 2019-03-10 NOTE — Progress Notes (Signed)
Subjective:   Patient ID: Alan Brooks, male   DOB: 43 y.o.   MRN: [id number]   HPI Patient states my right heel has started to hurt and my left one still bothers me and it did not not improve and seem to get worse over the last few days and I tried to be active   ROS      Objective:  Physical Exam  Neurovascular status intact with patient found to have quite a bit of inflammation still noted plantar heel left and also on the plantar heel right and does have moderate depression of the arch with history of different foot structural issues     Assessment:  Acute plantar fasciitis left right heel with pain worse after periods of sitting getting up in the morning and flatfoot deformity     Plan:  H&P conditions reviewed at today I did a sterile prep right and I injected the plantar fascial 3 mg Kenalog 5 mg Xylocaine and I went ahead and dispensed night splint for the left to try to support the arch and stretch the plantar fascia.  I gave instructions on physical therapy anti-inflammatories and I did go ahead and casted for functional orthotics to try to support the arch and reduce the pressure against the arch and heel

## 2019-03-16 MED FILL — METHYLPREDNISOLONE 4 MG TBP: 4 | 6 days supply | Qty: 21 | Fill #0

## 2019-03-24 ENCOUNTER — Ambulatory Visit: Payer: BC Managed Care – PPO | Admitting: Orthotics

## 2019-03-24 ENCOUNTER — Other Ambulatory Visit: Payer: Self-pay

## 2019-03-24 ENCOUNTER — Ambulatory Visit: Payer: BC Managed Care – PPO | Admitting: Podiatry

## 2019-03-24 DIAGNOSIS — M722 Plantar fascial fibromatosis: Secondary | ICD-10-CM

## 2019-03-24 NOTE — Progress Notes (Signed)
Patient came in today to pick up custom made foot orthotics.  The goals were accomplished and the patient reported no dissatisfaction with said orthotics.  Patient was advised of breakin period and how to report any issues.Patient came in today to pick up custom made foot orthotics.  The goals were accomplished and the patient reported no dissatisfaction with said orthotics.  Patient was advised of breakin period and how to report any issues. 

## 2019-03-28 ENCOUNTER — Encounter: Payer: Self-pay | Admitting: Adult Health

## 2019-03-30 ENCOUNTER — Encounter: Payer: Self-pay | Admitting: Adult Health

## 2019-03-30 ENCOUNTER — Other Ambulatory Visit: Payer: Self-pay

## 2019-03-30 ENCOUNTER — Ambulatory Visit: Payer: BC Managed Care – PPO | Admitting: Adult Health

## 2019-03-30 ENCOUNTER — Ambulatory Visit (INDEPENDENT_AMBULATORY_CARE_PROVIDER_SITE_OTHER): Payer: BC Managed Care – PPO

## 2019-03-30 VITALS — BP 120/82 | Temp 98.8°F | Wt 198.0 lb

## 2019-03-30 DIAGNOSIS — M546 Pain in thoracic spine: Secondary | ICD-10-CM

## 2019-03-30 DIAGNOSIS — F5232 Male orgasmic disorder: Secondary | ICD-10-CM | POA: Diagnosis not present

## 2019-03-30 DIAGNOSIS — K625 Hemorrhage of anus and rectum: Secondary | ICD-10-CM

## 2019-03-30 DIAGNOSIS — K429 Umbilical hernia without obstruction or gangrene: Secondary | ICD-10-CM | POA: Diagnosis not present

## 2019-03-30 DIAGNOSIS — S299XXA Unspecified injury of thorax, initial encounter: Secondary | ICD-10-CM | POA: Diagnosis not present

## 2019-03-30 MED ORDER — HYDROCORTISONE ACETATE 25 MG RE SUPP: 25.0000 mg | Freq: Two times a day (BID) | RECTAL | 3 refills | Status: DC | PRN

## 2019-03-30 MED FILL — HYDROCORTISONE ACETATE 25 M: 25 | 6 days supply | Qty: 12 | Fill #0

## 2019-03-30 NOTE — Progress Notes (Signed)
Subjective:    Patient ID: Alan Brooks, male    DOB: [birth year], 43 y.o.   MRN: [id number]  HPI  43 year old male who  has a past medical history of Allergy, Anxiety, and Asthma. He is presenting to the office today for multiple issues   1.  Erectile dysfunction and anorgasmia-has had issues with erectile dysfunction intermittently, he tried a friend's generic Viagra recently was able to get an erection.  More recently he has not had any issues with getting or maintaining erection.  His issue now is is he is having intermittent episodes of not being able to ejaculate or have an orgasm.  He is on Celexa but has been on this for approximately 10 years.  The issue of not being able to ejaculate is not consistent.  He denies loss of sensation.  2. Upper back pain -was painting his barn last week and developed upper thoracic discomfort.  Pain is worse with range of motion and palpation.  He is worried that he may have another slipped disc.  MRI and September 2019 showed a moderate size right paracentral disc protrusion at T9-10.  There was no cord deformity or nerve root encroachment.  3. Rectal Bleeding-last week he had an episode of bright red blood in his stool as well as on toilet paper.  He was not sure if this was caused from a hemorrhoid or psoriasis that he has on his buttocks.  Long with blood in stool he also endorses remittent pain that is especially apparent after having a hard bowel movement.  Denies anal itching.  4. Abdominal Pain -patient reports that he had noticed pain behind his bellybutton.  This pain has been intermittent but it does hurt a few times a week especially after a big meal.  Last week he pressed on his bellybutton and there was a "clicking or popping sensation".  Review of Systems See HPI   Past Medical History:  Diagnosis Date  . Allergy   . Anxiety   . Asthma     Social History   Socioeconomic History  . Marital status: Single    Spouse name: Not  on file  . Number of children: Not on file  . Years of education: Not on file  . Highest education level: Not on file  Occupational History  . Not on file  Social Needs  . Financial resource strain: Not on file  . Food insecurity    Worry: Not on file    Inability: Not on file  . Transportation needs    Medical: Not on file    Non-medical: Not on file  Tobacco Use  . Smoking status: Former Smoker    Packs/day: 1.00    Years: 15.00    Pack years: 15.00    Types: Cigarettes  . Smokeless tobacco: Never Used  Substance and Sexual Activity  . Alcohol use: No  . Drug use: No  . Sexual activity: Not on file  Lifestyle  . Physical activity    Days per week: Not on file    Minutes per session: Not on file  . Stress: Not on file  Relationships  . Social Musicianconnections    Talks on phone: Not on file    Gets together: Not on file    Attends religious service: Not on file    Active member of club or organization: Not on file    Attends meetings of clubs or organizations: Not on file    Relationship  status: Not on file  . Intimate partner violence    Fear of current or ex partner: Not on file    Emotionally abused: Not on file    Physically abused: Not on file    Forced sexual activity: Not on file  Other Topics Concern  . 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 No pertinent surgical history.  Family History  Problem Relation Age of Onset  . Heart attack Maternal Grandfather   . Heart attack Paternal Grandfather   . Asthma Brother   . Depression Other        entire family   . Anxiety disorder Other        entire family     No Known Allergies  Current Outpatient Medications on File Prior to Visit  Medication Sig Dispense Refill  . citalopram (CELEXA) 40 MG tablet TAKE 1 TABLET BY MOUTH ONCE DAILY 90 tablet 1  . Fluticasone-Salmeterol  (WIXELA INHUB) 250-50 MCG/DOSE AEPB Inhale 1 puff into the lungs 2 (two) times daily. 60 each 11  . hydrocortisone cream 1 % Apply 1 application topically 2 (two) times daily. 30 g 0  . ibuprofen (ADVIL,MOTRIN) 800 MG tablet Take 1 tablet (800 mg total) by mouth every 8 (eight) hours as needed. 30 tablet 0  . pantoprazole (PROTONIX) 40 MG tablet TAKE 1 TABLET BY MOUTH ONCE DAILY BEFORE BREAKFAST 90 tablet 3  . VENTOLIN HFA 108 (90 Base) MCG/ACT inhaler INHALE 2 PUFFS BY MOUTH INTO THE LUNGS EVERY 6 HOURS AS NEEDED FOR WHEEZING OR SHORTNESS OF BREATH 18 g 2   No current facility-administered medications on file prior to visit.     BP 120/82   Temp 98.8 F (37.1 C) (Oral)   Wt 198 lb (89.8 kg)   BMI 25.94 kg/m       Objective:   Physical Exam Vitals signs and nursing note reviewed.  Constitutional:      Appearance: Normal appearance.  Cardiovascular:     Rate and Rhythm: Normal rate and regular rhythm.     Pulses: Normal pulses.     Heart sounds: Normal heart sounds.  Pulmonary:     Effort: Pulmonary effort is normal.     Breath sounds: Normal breath sounds.  Abdominal:     General: Abdomen is flat. Bowel sounds are normal.     Palpations: Abdomen is soft.     Tenderness: There is no abdominal tenderness.     Hernia: A hernia (easily reduced) is present. Hernia is present in the umbilical area.  Genitourinary:    Prostate: Normal.     Rectum: Guaiac result negative. Internal hemorrhoid present. No mass, tenderness, anal fissure or external hemorrhoid. Normal anal tone.  Musculoskeletal: Normal range of motion.        General: Tenderness present.     Thoracic back: He exhibits bony tenderness (T3-T5). He exhibits normal range of motion, no swelling and no edema.  Skin:    General: Skin is warm and dry.     Capillary Refill: Capillary refill takes less than 2 seconds.     Findings: Rash present.     Comments: Psoriatic rash noted in gluteal cleft  Neurological:     General:  No focal deficit present.     Mental Status: He is alert and oriented to person,  place, and time.     Cranial Nerves: No cranial nerve deficit.     Sensory: No sensory deficit.     Motor: No weakness.     Coordination: Coordination normal.     Gait: Gait normal.     Deep Tendon Reflexes: Reflexes normal.  Psychiatric:        Mood and Affect: Mood normal.        Behavior: Behavior normal.        Thought Content: Thought content normal.        Judgment: Judgment normal.       Assessment & Plan:   1. Acute midline thoracic back pain -We will start off with x-ray, may need MRI in the future.  Advise rest and Motrin. - DG Thoracic Spine W/Swimmers; Future  2. Rectal bleeding -Hemoccult negative.  Likely due to small internal hemorrhoid.  Encouraged increased fiber and water intake.  Will send in hydrocortisone suppository - hydrocortisone (ANUSOL-HC) 25 MG suppository; Place 1 suppository (25 mg total) rectally 2 (two) times daily as needed for hemorrhoids or anal itching.  Dispense: 12 suppository; Refill: 3  3. Umbilical hernia without obstruction and without gangrene -No pain with palpation today.  He does have a small umbilical hernia that was easily reduced.  Advised watchful waiting  4. Anorgasmia of male -ED seems to have resolved without medication.  Do not think that his lack of being able to orgasm is due to Celexa since he has been on it for so long, likely psychosomatic advise cognitive behavioral therapy to start off with.  Have sex until it stops feeling good and then quit even if he has not been able to orgasm.  Alan Freesory Jakobie Henslee, NP

## 2019-03-31 ENCOUNTER — Other Ambulatory Visit: Payer: BC Managed Care – PPO | Admitting: Orthotics

## 2019-04-11 ENCOUNTER — Encounter: Payer: Self-pay | Admitting: Podiatry

## 2019-04-28 MED FILL — PANTOPRAZOLE SOD DR 40 MG T: 40 | 90 days supply | Qty: 90 | Fill #1

## 2019-04-28 MED FILL — ADVAIR 250/50 DISKUS: 250-50 | 30 days supply | Qty: 60 | Fill #1

## 2019-04-28 MED FILL — CITALOPRAM HBR 40 MG TABLET: 40 | 90 days supply | Qty: 90 | Fill #1

## 2019-05-04 ENCOUNTER — Telehealth: Payer: Self-pay | Admitting: Family Medicine

## 2019-05-04 NOTE — Telephone Encounter (Signed)
PA request submitted by Cover My Meds.  Waiting on a response.  Key:  [id number]

## 2019-05-05 MED FILL — HYDROCORTISONE AC 25 MG SUP: 25 | 6 days supply | Qty: 12 | Fill #1

## 2019-05-05 NOTE — Telephone Encounter (Signed)
Received response from the plan.  It stated, "member plan does not require authorization for this request.  Paid test claim on 05/04/2019.  Please advise pharmacy to change to [facility] [phone] for paid claim or another NDC.  Current one they are processing is not covered.  No further action needed."  Called and spoke to the pharmacy.  They were able to change NDC # and now the cost of medication will be $10 for the pt.  Left a detailed message on identified machine.  Advised a call back if any questions.  Nothing further needed.

## 2019-05-28 MED FILL — CLOBETASOL 0.05% SHAMPOO: 0.05 | 30 days supply | Qty: 118 | Fill #3

## 2019-05-28 MED FILL — CICLOPIROX 1% SHAMPOO: 1 | 30 days supply | Qty: 120 | Fill #2

## 2019-05-28 MED FILL — ADVAIR 250/50 DISKUS: 250-50 | 30 days supply | Qty: 60 | Fill #2

## 2019-06-01 ENCOUNTER — Encounter: Payer: Self-pay | Admitting: Podiatry

## 2019-06-06 ENCOUNTER — Encounter: Payer: Self-pay | Admitting: Podiatry

## 2019-06-06 ENCOUNTER — Other Ambulatory Visit: Payer: Self-pay

## 2019-06-06 ENCOUNTER — Ambulatory Visit: Payer: BC Managed Care – PPO | Admitting: Podiatry

## 2019-06-06 ENCOUNTER — Ambulatory Visit (INDEPENDENT_AMBULATORY_CARE_PROVIDER_SITE_OTHER): Payer: BC Managed Care – PPO

## 2019-06-06 DIAGNOSIS — M722 Plantar fascial fibromatosis: Secondary | ICD-10-CM | POA: Diagnosis not present

## 2019-06-06 DIAGNOSIS — M205X9 Other deformities of toe(s) (acquired), unspecified foot: Secondary | ICD-10-CM | POA: Diagnosis not present

## 2019-06-06 NOTE — Patient Instructions (Signed)
Pre-Operative Instructions  Congratulations, you have decided to take an important step towards improving your quality of life.  You can be assured that the doctors and staff at [facility] will be with you every step of the way.  Here are some important things you should know:  1. Plan to be at the surgery center/hospital at least 1 (one) hour prior to your scheduled time, unless otherwise directed by the surgical center/hospital staff.  You must have a responsible adult accompany you, remain during the surgery and drive you home.  Make sure you have directions to the surgical center/hospital to ensure you arrive on time. 2. If you are having surgery at [facility] or [facility] hospitals, you will need a copy of your medical history and physical form from your family physician within one month prior to the date of surgery. We will give you a form for your primary physician to complete.  3. We make every effort to accommodate the date you request for surgery.  However, there are times where surgery dates or times have to be moved.  We will contact you as soon as possible if a change in schedule is required.   4. No aspirin/ibuprofen for one week before surgery.  If you are on aspirin, any non-steroidal anti-inflammatory medications (Mobic, Aleve, Ibuprofen) should not be taken seven (7) days prior to your surgery.  You make take Tylenol for pain prior to surgery.  5. Medications - If you are taking daily heart and blood pressure medications, seizure, reflux, allergy, asthma, anxiety, pain or diabetes medications, make sure you notify the surgery center/hospital before the day of surgery so they can tell you which medications you should take or avoid the day of surgery. 6. No food or drink after midnight the night before surgery unless directed otherwise by surgical center/hospital staff. 7. No alcoholic beverages 24-hours prior to surgery.  No smoking 24-hours prior or 24-hours after  surgery. 8. Wear loose pants or shorts. They should be loose enough to fit over bandages, boots, and casts. 9. Don't wear slip-on shoes. Sneakers are preferred. 10. Bring your boot with you to the surgery center/hospital.  Also bring crutches or a walker if your physician has prescribed it for you.  If you do not have this equipment, it will be provided for you after surgery. 11. If you have not been contacted by the surgery center/hospital by the day before your surgery, call to confirm the date and time of your surgery. 12. Leave-time from work may vary depending on the type of surgery you have.  Appropriate arrangements should be made prior to surgery with your employer. 13. Prescriptions will be provided immediately following surgery by your doctor.  Fill these as soon as possible after surgery and take the medication as directed. Pain medications will not be refilled on weekends and must be approved by the doctor. 14. Remove nail polish on the operative foot and avoid getting pedicures prior to surgery. 15. Wash the night before surgery.  The night before surgery wash the foot and leg well with water and the antibacterial soap provided. Be sure to pay special attention to beneath the toenails and in between the toes.  Wash for at least three (3) minutes. Rinse thoroughly with water and dry well with a towel.  Perform this wash unless told not to do so by your physician.  Enclosed: 1 Ice pack (please put in freezer the night before surgery)   1 Hibiclens skin cleaner     Pre-op instructions  If you have any questions regarding the instructions, please do not hesitate to call our office.  [place]: [address], [place], [place] [address] -- [phone]  [place]: [address] [place], [place] [address] -- [phone]  [place]: [address]  [place], [place] [address] -- [phone]   Website: [email or web address] 

## 2019-06-06 NOTE — Progress Notes (Signed)
Subjective:   Patient ID: Alan Brooks, male   DOB: 43 y.o.   MRN: [id number]   HPI Patient presents stating my heel has become so tender that I cannot even bear weight hardly right over left foot.  Patient states that the left is still sore but not to the same degree of the right what is been awful for the last number of months and is gradually becoming more of an issue and also has continued reduced range of motion of the first MPJ right with history of pain associated with it   ROS      Objective:  Physical Exam  Acute plantar fasciitis right over left with inflammation fluid of the medial band along with hallux limitus deformity and spur formation right over left with pain in the joint and enlargement on clinical findings     Assessment:  Acute plantar fasciitis right upper left its not responded so far to numerous conservative treatments along with hallux limitus with spur formation right over left     Plan:  H&P all conditions reviewed and at this point I have recommended endoscopic release of the tendon along with spur removal of the first metatarsal head right.  This is because of the extreme pain the patient is in and the failure to respond conservatively and at this point I went ahead and I allowed patient to review consent form going over alternative treatments complications.  Patient wants surgery understanding there is no guarantee as far as success and can develop arch pain or other pains associated with it and at this time signed consent form after extensive review.  Patient understands total recovery can take 6 months an air fracture walker is dispensed for the postoperative.  As the other one has been destroyed by past usage.  Reappoint for [place] to recheck again as symptoms indicate or questions were to come up prior to procedure and patient is scheduled for date of surgery  X-rays indicate that there is spurring of the first metatarsal head right with a loose piece of  bone within the joint with minimal spurring left and no plantar spur formation noted bilateral

## 2019-06-07 ENCOUNTER — Telehealth: Payer: Self-pay | Admitting: Podiatry

## 2019-06-07 NOTE — Telephone Encounter (Signed)
DOS: 06/21/2019 SURGICAL PROCEDURES: Keller/McBride Bunionectomy Rt, Endoscopic Plantar Fasciotomy Rt CPT CODES: 42683, 41962 DX CODES: M20.21(Hallux Rigidus), M72.2  Member Number: [id number]  Policy Effective : 40/81/4481  -  08/24/2198   Name: Alan Brooks  Date of Birth: 03-15-1976  Member Liability Summary       In-Network   Max Per Benefit Period Year-to-Date Remaining     CoInsurance 20%         Deductible $1000.00 $971.57     Out-Of-Pocket 3 $3000.00 $1484.96      Out-of-Network   Max Per Benefit Period Year-to-Date Remaining     CoInsurance 30%         Deductible $2000.00 $2000.00     Out-Of-Pocket 3 $6000.00 $6000.00 3 Out-of-Pocket includes copay, deductible, and coinsurance.  Hospital - [facility]        In Network Copay Coinsurance Authorization Required Not Applicable 85%  per  Service Year No      Out of Network Copay Coinsurance Authorization Required Not Applicable 63%  per  Service Year No

## 2019-06-08 ENCOUNTER — Ambulatory Visit (INDEPENDENT_AMBULATORY_CARE_PROVIDER_SITE_OTHER): Payer: BC Managed Care – PPO

## 2019-06-08 ENCOUNTER — Encounter: Payer: Self-pay | Admitting: Adult Health

## 2019-06-08 ENCOUNTER — Other Ambulatory Visit: Payer: Self-pay

## 2019-06-08 DIAGNOSIS — Z23 Encounter for immunization: Secondary | ICD-10-CM

## 2019-06-09 DIAGNOSIS — M722 Plantar fascial fibromatosis: Secondary | ICD-10-CM | POA: Diagnosis not present

## 2019-06-17 ENCOUNTER — Telehealth: Payer: Self-pay | Admitting: Podiatry

## 2019-06-17 NOTE — Telephone Encounter (Signed)
Pt called to cancel his surgery for Tuesday and his scheduled postop appointments. Stated he wants to try and delay the surgery as long as possible. I have cancelled his postop visits, had Delydia cancel his surgery in the sx book, and I cancelled on Dr. Mellody Drown schedule in [place]. Pt stated he notified [facility] but I will follow up with them as well to make sure his surgery has been cancelled.

## 2019-06-30 ENCOUNTER — Encounter: Payer: BC Managed Care – PPO | Admitting: Podiatry

## 2019-07-01 DIAGNOSIS — Z20828 Contact with and (suspected) exposure to other viral communicable diseases: Secondary | ICD-10-CM | POA: Diagnosis not present

## 2019-07-08 ENCOUNTER — Other Ambulatory Visit: Payer: Self-pay | Admitting: Adult Health

## 2019-07-08 MED FILL — ADVAIR 250/50 DISKUS: 250-50 | 30 days supply | Qty: 60 | Fill #3

## 2019-07-11 MED FILL — HYDROCORTISONE AC 25 MG SUP: 25 | 6 days supply | Qty: 12 | Fill #2

## 2019-07-11 MED FILL — CLOBETASOL 0.05% SOLUTION: 0.05 | 30 days supply | Qty: 50 | Fill #0

## 2019-07-12 NOTE — Telephone Encounter (Signed)
Sent to the pharmacy by e-scribe. 

## 2019-07-14 ENCOUNTER — Encounter: Payer: BC Managed Care – PPO | Admitting: Podiatry

## 2019-07-28 MED FILL — PANTOPRAZOLE SOD DR 40 MG T: 40 | 90 days supply | Qty: 90 | Fill #2

## 2019-07-28 MED FILL — CITALOPRAM HBR 40 MG TABLET: 40 | 90 days supply | Qty: 90 | Fill #0

## 2019-09-05 ENCOUNTER — Encounter: Payer: Self-pay | Admitting: Adult Health

## 2019-09-06 NOTE — Telephone Encounter (Signed)
Pt has been scheduled.  °

## 2019-09-07 ENCOUNTER — Other Ambulatory Visit: Payer: Self-pay

## 2019-09-07 ENCOUNTER — Encounter: Payer: Self-pay | Admitting: Adult Health

## 2019-09-07 ENCOUNTER — Ambulatory Visit (INDEPENDENT_AMBULATORY_CARE_PROVIDER_SITE_OTHER): Payer: BC Managed Care – PPO

## 2019-09-07 ENCOUNTER — Ambulatory Visit (INDEPENDENT_AMBULATORY_CARE_PROVIDER_SITE_OTHER): Payer: BC Managed Care – PPO | Admitting: Adult Health

## 2019-09-07 VITALS — BP 102/80 | HR 97 | Temp 98.8°F | Ht 73.25 in | Wt 194.0 lb

## 2019-09-07 DIAGNOSIS — M898X1 Other specified disorders of bone, shoulder: Secondary | ICD-10-CM

## 2019-09-07 DIAGNOSIS — R222 Localized swelling, mass and lump, trunk: Secondary | ICD-10-CM | POA: Diagnosis not present

## 2019-09-07 DIAGNOSIS — M25511 Pain in right shoulder: Secondary | ICD-10-CM | POA: Diagnosis not present

## 2019-09-07 NOTE — Progress Notes (Signed)
Subjective:    Patient ID: Alan Brooks, male    DOB: May 15, 1976, 44 y.o.   MRN: [id number]  HPI 44 year old male who  has a past medical history of Allergy, Anxiety, and Asthma.  Resents to the office today for an acute issue.  Over the last 2 weeks he has noticed swelling and pain with palpation to his right clavicle.  Denies trauma or aggravating injury.   Review of Systems See HPI   Past Medical History:  Diagnosis Date  . Allergy   . Anxiety   . Asthma     Social History   Socioeconomic History  . Marital status: Single    Spouse name: Not on file  . Number of children: Not on file  . Years of education: Not on file  . Highest education level: Not on file  Occupational History  . Not on file  Tobacco Use  . Smoking status: Former Smoker    Packs/day: 1.00    Years: 15.00    Pack years: 15.00    Types: Cigarettes  . Smokeless tobacco: Never Used  Substance and Sexual Activity  . Alcohol use: No  . Drug use: No  . Sexual activity: Not on file  Other Topics Concern  . Not on file  Social History Narrative   Manage stock at a [employer]    Not married but in a relationship for 13 years    No kids    Social Determinants of Health   Financial Resource Strain:   . Difficulty of Paying Living Expenses: Not on file  Food Insecurity:   . Worried About Charity fundraiser in the Last Year: Not on file  . Ran Out of Food in the Last Year: Not on file  Transportation Needs:   . Lack of Transportation (Medical): Not on file  . Lack of Transportation (Non-Medical): Not on file  Physical Activity:   . Days of Exercise per Week: Not on file  . Minutes of Exercise per Session: Not on file  Stress:   . Feeling of Stress : Not on file  Social Connections:   . Frequency of Communication with Friends and Family: Not on file  . Frequency of Social Gatherings with Friends and Family: Not on file  . Attends Religious Services: Not on file  . Active  Member of Clubs or Organizations: Not on file  . Attends Archivist Meetings: Not on file  . Marital Status: Not on file  Intimate Partner Violence:   . Fear of Current or Ex-Partner: Not on file  . Emotionally Abused: Not on file  . Physically Abused: Not on file  . Sexually Abused: Not on file    History reviewed. No pertinent surgical history.  Family History  Problem Relation Age of Onset  . Heart attack Maternal Grandfather   . Heart attack Paternal Grandfather   . Asthma Brother   . Depression Other        entire family   . Anxiety disorder Other        entire family     No Known Allergies  Current Outpatient Medications on File Prior to Visit  Medication Sig Dispense Refill  . citalopram (CELEXA) 40 MG tablet TAKE 1 TABLET BY MOUTH ONCE DAILY 90 tablet 0  . Fluticasone-Salmeterol (WIXELA INHUB) 250-50 MCG/DOSE AEPB Inhale 1 puff into the lungs 2 (two) times daily. 60 each 11  . hydrocortisone (ANUSOL-HC) 25 MG suppository Place 1  suppository (25 mg total) rectally 2 (two) times daily as needed for hemorrhoids or anal itching. 12 suppository 3  . hydrocortisone cream 1 % Apply 1 application topically 2 (two) times daily. 30 g 0  . ibuprofen (ADVIL,MOTRIN) 800 MG tablet Take 1 tablet (800 mg total) by mouth every 8 (eight) hours as needed. 30 tablet 0  . pantoprazole (PROTONIX) 40 MG tablet TAKE 1 TABLET BY MOUTH ONCE DAILY BEFORE BREAKFAST 90 tablet 3  . VENTOLIN HFA 108 (90 Base) MCG/ACT inhaler INHALE 2 PUFFS BY MOUTH INTO THE LUNGS EVERY 6 HOURS AS NEEDED FOR WHEEZING OR SHORTNESS OF BREATH 18 g 2   No current facility-administered medications on file prior to visit.    BP 102/80   Pulse 97   Temp 98.8 F (37.1 C) (Other (Comment))   Ht 6' 1.25" (1.861 m)   Wt 194 lb (88 kg)   SpO2 97%   BMI 25.42 kg/m       Objective:   Physical Exam Vitals and nursing note reviewed.  Constitutional:      Appearance: Normal appearance.  Cardiovascular:      Rate and Rhythm: Normal rate and regular rhythm.     Pulses: Normal pulses.     Heart sounds: Normal heart sounds.  Pulmonary:     Effort: Pulmonary effort is normal.     Breath sounds: Normal breath sounds.  Musculoskeletal:        General: Tenderness present.     Comments: Soft tissue swelling noted at right sternoclavicular notch.  Has pain with palpation.  No abscess, carbuncle, or lipoma noted.  Has full range of motion of the right arm.  Skin:    General: Skin is warm and dry.     Capillary Refill: Capillary refill takes less than 2 seconds.     Findings: Erythema present.  Neurological:     General: No focal deficit present.     Mental Status: He is alert and oriented to person, place, and time.  Psychiatric:        Mood and Affect: Mood normal.        Behavior: Behavior normal.        Thought Content: Thought content normal.        Judgment: Judgment normal.       Assessment & Plan:  1. Clavicle pain -Possible cellulitis.  Will get x-ray and blood work.  Doubtful B-cell lymphoma but will check LDH.  Consider short course of antibiotics and prednisone if no improvement then can do ultrasound or CT. - DG Sternum; Future - CBC with Differential/Platelet - CMP - Lactate Dehydrogenase - DG Sternum  Shirline Frees, NP

## 2019-09-08 ENCOUNTER — Telehealth: Payer: Self-pay | Admitting: Adult Health

## 2019-09-08 LAB — COMPREHENSIVE METABOLIC PANEL
ALT: 16 U/L (ref 0–53)
AST: 15 U/L (ref 0–37)
Albumin: 4.3 g/dL (ref 3.5–5.2)
Alkaline Phosphatase: 61 U/L (ref 39–117)
BUN: 15 mg/dL (ref 6–23)
CO2: 30 mEq/L (ref 19–32)
Calcium: 9.8 mg/dL (ref 8.4–10.5)
Chloride: 103 mEq/L (ref 96–112)
Creatinine, Ser: 1.04 mg/dL (ref 0.40–1.50)
GFR: 78.45 mL/min (ref >60.00)
Glucose, Bld: 86 mg/dL (ref 70–99)
Potassium: 4.6 mEq/L (ref 3.5–5.1)
Sodium: 140 mEq/L (ref 135–145)
Total Bilirubin: 0.4 mg/dL (ref 0.2–1.2)
Total Protein: 6.9 g/dL (ref 6.0–8.3)

## 2019-09-08 LAB — CBC WITH DIFFERENTIAL/PLATELET
Basophils Absolute: 0 10*3/uL (ref 0.0–0.1)
Basophils Relative: 0.3 % (ref 0.0–3.0)
Eosinophils Absolute: 0.3 10*3/uL (ref 0.0–0.7)
Eosinophils Relative: 4.5 % (ref 0.0–5.0)
HCT: 46.6 % (ref 39.0–52.0)
Hemoglobin: 15.3 g/dL (ref 13.0–17.0)
Lymphocytes Relative: 27.9 % (ref 12.0–46.0)
Lymphs Abs: 1.9 10*3/uL (ref 0.7–4.0)
MCHC: 32.8 g/dL (ref 30.0–36.0)
MCV: 93 fl (ref 78.0–100.0)
Monocytes Absolute: 0.6 10*3/uL (ref 0.1–1.0)
Monocytes Relative: 8.4 % (ref 3.0–12.0)
Neutro Abs: 4 10*3/uL (ref 1.4–7.7)
Neutrophils Relative %: 58.9 % (ref 43.0–77.0)
Platelets: 277 10*3/uL (ref 150.0–400.0)
RBC: 5.01 Mil/uL (ref 4.22–5.81)
RDW: 13.6 % (ref 11.5–15.5)
WBC: 6.7 10*3/uL (ref 4.0–10.5)

## 2019-09-08 LAB — LACTATE DEHYDROGENASE: LDH: 106 U/L (ref 100–220)

## 2019-09-08 MED ORDER — PREDNISONE 10 MG PO TABS: 10.0000 mg | ORAL_TABLET | Freq: Every day | ORAL | 0 refills | Status: AC

## 2019-09-08 MED ORDER — DOXYCYCLINE HYCLATE 100 MG PO CAPS: 100.0000 mg | ORAL_CAPSULE | Freq: Two times a day (BID) | ORAL | 0 refills | Status: DC

## 2019-09-08 MED FILL — DOXYCYCLINE HYC 100 MG CAPS: 100 | 7 days supply | Qty: 14 | Fill #0

## 2019-09-08 MED FILL — predniSONE 10 MG TABS: 10 | 5 days supply | Qty: 5 | Fill #0

## 2019-09-08 NOTE — Telephone Encounter (Signed)
I spoke to the patient and informed him of his x-ray and lab work which were normal.  Soft tissue swelling may be due to cyst cellulitis or carbuncle.  We will trial him on low-dose prednisone and doxycycline.  He was advised to follow-up if no improvement after that treatment and at that time we will look at getting an ultrasound or x-ray

## 2019-09-16 MED FILL — ADVAIR 250/50 DISKUS: 250-50 | 30 days supply | Qty: 60 | Fill #4

## 2019-11-03 ENCOUNTER — Other Ambulatory Visit: Payer: Self-pay | Admitting: Adult Health

## 2019-11-03 MED FILL — ADVAIR 250/50 DISKUS: 250-50 | 30 days supply | Qty: 60 | Fill #5

## 2019-11-03 MED FILL — PANTOPRAZOLE SOD DR 40 MG T: 40 | 90 days supply | Qty: 90 | Fill #3

## 2019-11-03 MED FILL — CITALOPRAM HBR 40 MG TABLET: 40 | 90 days supply | Qty: 90 | Fill #0

## 2019-11-03 NOTE — Telephone Encounter (Signed)
Sent to the pharmacy by e-scribe.  I have scheduled him for his yearly physical.

## 2019-11-21 ENCOUNTER — Other Ambulatory Visit: Payer: Self-pay

## 2019-11-21 ENCOUNTER — Encounter: Payer: Self-pay | Admitting: Family Medicine

## 2019-11-21 ENCOUNTER — Ambulatory Visit (INDEPENDENT_AMBULATORY_CARE_PROVIDER_SITE_OTHER): Payer: BC Managed Care – PPO | Admitting: Family Medicine

## 2019-11-21 VITALS — BP 116/68 | HR 102 | Temp 98.3°F | Wt 193.3 lb

## 2019-11-21 DIAGNOSIS — M7062 Trochanteric bursitis, left hip: Secondary | ICD-10-CM | POA: Diagnosis not present

## 2019-11-21 NOTE — Patient Instructions (Signed)
Hip Bursitis  Hip bursitis is inflammation of a fluid-filled sac (bursa) in the hip joint. The bursa prevents the bones in the hip joint from rubbing against each other. Hip bursitis can cause mild to moderate pain, and symptoms often come and go over time. What are the causes? This condition may be caused by:  Injury to the hip.  Overuse of the muscles that surround the hip joint.  Previous injury or surgery of the hip.  Arthritis or gout.  Diabetes.  Thyroid disease.  Infection. In some cases, the cause may not be known. What are the signs or symptoms? Symptoms of this condition include:  Mild or moderate pain in the hip area. Pain may get worse with movement.  Tenderness and swelling of the hip, especially on the outer side of the hip.  In rare cases, the bursa may become infected. This may cause a fever, as well as warmth and redness in the area. Symptoms may come and go. How is this diagnosed? This condition may be diagnosed based on:  A physical exam.  Your medical history.  X-rays.  Removal of fluid from your inflamed bursa for testing (biopsy). You may be sent to a health care provider who specializes in bone diseases (orthopedist) or a provider who specializes in joint inflammation (rheumatologist). How is this treated? This condition is treated by resting, icing, applying pressure (compression), and raising (elevating) the injured area. This is called RICE treatment. In some cases, this may be enough to make your symptoms go away. Treatment may also include:  Using crutches.  Draining fluid out of the bursa to help relieve swelling.  Injecting medicine that helps to reduce inflammation (cortisone).  Additional medicines if the bursa is infected. Follow these instructions at home: Managing pain, stiffness, and swelling   If directed, put ice on the painful area. ? Put ice in a plastic bag. ? Place a towel between your skin and the bag. ? Leave the ice  on for 20 minutes, 2-3 times a day. ? Raise (elevate) your hip above the level of your heart as much as you can without pain. To do this, try putting a pillow under your hips while you lie down. Activity  Return to your normal activities as told by your health care provider. Ask your health care provider what activities are safe for you.  Rest and protect your hip as much as possible until your pain and swelling get better. General instructions  Take over-the-counter and prescription medicines only as told by your health care provider.  Wear compression wraps only as told by your health care provider.  Do not use your hip to support your body weight until your health care provider says that you can. Use crutches as told by your health care provider.  Gently massage and stretch your injured area as often as is comfortable.  Keep all follow-up visits as told by your health care provider. This is important. How is this prevented?  Exercise regularly, as told by your health care provider.  Warm up and stretch before being active.  Cool down and stretch after being active.  If an activity irritates your hip or causes pain, avoid the activity as much as possible.  Avoid sitting down for long periods at a time. Contact a health care provider if you:  Have a fever.  Develop new symptoms.  Have difficulty walking or doing everyday activities.  Have pain that gets worse or does not get better with medicine.    Develop red skin or a feeling of warmth in your hip area. Get help right away if you:  Cannot move your hip.  Have severe pain. Summary  Hip bursitis is inflammation of a fluid-filled sac (bursa) in the hip joint.  Hip bursitis can cause mild to moderate pain, and symptoms often come and go over time.  This condition is treated with rest, ice, compression, elevation, and medicines. This information is not intended to replace advice given to you by your health care  provider. Make sure you discuss any questions you have with your health care provider. Document Revised: 04/19/2018 Document Reviewed: 04/19/2018 Elsevier Patient Education  2020 [employer]  

## 2019-11-21 NOTE — Progress Notes (Addendum)
  Subjective:     Patient ID: Alan Brooks, male   DOB: 1976/04/14, 44 y.o.   MRN: [id number]  HPI  Alan Brooks seen with left lateral hip pain.  This has been present for probably a few months but especially worse over the past couple weeks.  He is starting to have sharp pain at night when rolling over in bed.  His pain is consistently lateral hip.  No radiculitis symptoms.  No numbness.  No weakness.  Has not tried any icing several months.  Pain is worse with position change.  No anterior or medial hip pain.  Past Medical History:  Diagnosis Date  . Allergy   . Anxiety   . Asthma    No past surgical history on file.  reports that he has quit smoking. His smoking use included cigarettes. He has a 15.00 pack-year smoking history. He has never used smokeless tobacco. He reports that he does not drink alcohol or use drugs. family history includes Anxiety disorder in an other family member; Asthma in his brother; Depression in an other family member; Heart attack in his maternal grandfather and paternal grandfather. No Known Allergies]  Review of Systems  Constitutional: Negative for appetite change, fever and unexpected weight change.       Objective:   Physical Exam Vitals reviewed.  Constitutional:      Appearance: Normal appearance.  Cardiovascular:     Rate and Rhythm: Normal rate and regular rhythm.  Musculoskeletal:     Comments: Full range of motion left hip.  He has tenderness over the greater trochanteric bursa.  No visible swelling.  No erythema.  Neurological:     Mental Status: He is alert.     Comments: Full strength lower extremities.        Assessment:     Progressive left lateral hip pain.  Suspect greater trochanteric bursitis    Plan:     -We recommend he try some icing 2-3 times daily  -Discussed risk and benefits of steroid injection including risk of bleeding, bruising, low risk of infection and patient consented.  Using 25-gauge 1 inch needle  injected 1 cc of Depo-Medrol and 2 cc of plain Xylocaine and patient tolerated well.  -We reviewed some stretches for the iliotibial band and lateral hip region.  Resume regular activities as tolerated.  Touch base in 1 week if not improved.  We discussed possible use of diclofenac 1% gel if he has any future recurrences or if not improving with the above  Kristian Covey MD [facility]  Injection of bursa above was for 80 mg of depo-medrol.  Kristian Covey MD [facility] at [facility]

## 2019-11-27 NOTE — Progress Notes (Signed)
Thanks for catching.  It was for 80 mg and I will addend the note.

## 2019-11-30 ENCOUNTER — Encounter: Payer: Self-pay | Admitting: Adult Health

## 2019-12-01 ENCOUNTER — Telehealth (INDEPENDENT_AMBULATORY_CARE_PROVIDER_SITE_OTHER): Payer: BC Managed Care – PPO | Admitting: Adult Health

## 2019-12-01 ENCOUNTER — Encounter: Payer: Self-pay | Admitting: Adult Health

## 2019-12-01 VITALS — Temp 97.8°F | Wt 195.0 lb

## 2019-12-01 DIAGNOSIS — M25552 Pain in left hip: Secondary | ICD-10-CM

## 2019-12-01 NOTE — Progress Notes (Signed)
Virtual Visit via Video Note  I connected with Alan Brooks on 12/01/19 at  4:00 PM EDT by a video enabled telemedicine application and verified that I am speaking with the correct person using two identifiers.  Location patient: home Location provider:work or home office Persons participating in the virtual visit: patient, provider  I discussed the limitations of evaluation and management by telemedicine and the availability of in person appointments. The patient expressed understanding and agreed to proceed.   HPI: 44 year old male who  has a past medical history of Allergy, Anxiety, and Asthma.  He is being seen today for issue of left hip pain.  He reports that he was seen on 11/21/2019 by another provider in the office for left hip pain and was diagnosed with trochanter bursitis.  He was given a steroid shot and reports that within 24 hours his pain had disappeared since that time that pain has returned but is very minimal.    Left hip pain that he would like evaluated today has been present for roughly 9 months.  The pain is located "where my left femur meets by hip.  If he stands the pain is underneath his left buttocks.  Pain has been intermittent and some days he has no pain but other days the pain gets worse with aggravation.  He has no difficulty walking.  Stretching aggravates the pain.  He denies trauma or aggravating injury.   ROS: See pertinent positives and negatives per HPI.  Past Medical History:  Diagnosis Date  . Allergy   . Anxiety   . Asthma     History reviewed. No pertinent surgical history.  Family History  Problem Relation Age of Onset  . Heart attack Maternal Grandfather   . Heart attack Paternal Grandfather   . Asthma Brother   . Depression Other        entire family   . Anxiety disorder Other        entire family        Current Outpatient Medications:  .  citalopram (CELEXA) 40 MG tablet, TAKE 1 TABLET BY MOUTH ONCE DAILY, Disp: 90 tablet,  Rfl: 0 .  Fluticasone-Salmeterol (WIXELA INHUB) 250-50 MCG/DOSE AEPB, Inhale 1 puff into the lungs 2 (two) times daily., Disp: 60 each, Rfl: 11 .  hydrocortisone (ANUSOL-HC) 25 MG suppository, Place 1 suppository (25 mg total) rectally 2 (two) times daily as needed for hemorrhoids or anal itching., Disp: 12 suppository, Rfl: 3 .  hydrocortisone cream 1 %, Apply 1 application topically 2 (two) times daily., Disp: 30 g, Rfl: 0 .  ibuprofen (ADVIL,MOTRIN) 800 MG tablet, Take 1 tablet (800 mg total) by mouth every 8 (eight) hours as needed., Disp: 30 tablet, Rfl: 0 .  pantoprazole (PROTONIX) 40 MG tablet, TAKE 1 TABLET BY MOUTH ONCE DAILY BEFORE BREAKFAST, Disp: 90 tablet, Rfl: 3 .  VENTOLIN HFA 108 (90 Base) MCG/ACT inhaler, INHALE 2 PUFFS BY MOUTH INTO THE LUNGS EVERY 6 HOURS AS NEEDED FOR WHEEZING OR SHORTNESS OF BREATH, Disp: 18 g, Rfl: 2  EXAM:  VITALS per patient if applicable:  GENERAL: alert, oriented, appears well and in no acute distress  HEENT: atraumatic, conjunttiva clear, no obvious abnormalities on inspection of external nose and ears  NECK: normal movements of the head and neck  LUNGS: on inspection no signs of respiratory distress, breathing rate appears normal, no obvious gross SOB, gasping or wheezing  CV: no obvious cyanosis  MS: moves all visible extremities without noticeable abnormality  PSYCH/NEURO: pleasant and cooperative, no obvious depression or anxiety, speech and thought processing grossly intact  ASSESSMENT AND PLAN:  Discussed the following assessment and plan:  1. Left hip pain -Is very difficult to tell over video where he was talking about but it seems to be the ischial tuberosity?  Will refer to sports medicine for further evaluation since this is been present intermittently for 9 months - Ambulatory referral to Sports Medicine  I discussed the assessment and treatment plan with the patient. The patient was provided an opportunity to ask questions and  all were answered. The patient agreed with the plan and demonstrated an understanding of the instructions.   The patient was advised to call back or seek an in-person evaluation if the symptoms worsen or if the condition fails to improve as anticipated.   Shirline Frees, NP

## 2019-12-12 ENCOUNTER — Ambulatory Visit (INDEPENDENT_AMBULATORY_CARE_PROVIDER_SITE_OTHER): Payer: BC Managed Care – PPO

## 2019-12-12 ENCOUNTER — Ambulatory Visit: Payer: BC Managed Care – PPO | Admitting: Family Medicine

## 2019-12-12 ENCOUNTER — Encounter: Payer: Self-pay | Admitting: Family Medicine

## 2019-12-12 ENCOUNTER — Other Ambulatory Visit: Payer: Self-pay

## 2019-12-12 VITALS — BP 116/88 | HR 82 | Ht 73.25 in | Wt 199.0 lb

## 2019-12-12 DIAGNOSIS — M7062 Trochanteric bursitis, left hip: Secondary | ICD-10-CM

## 2019-12-12 DIAGNOSIS — M7072 Other bursitis of hip, left hip: Secondary | ICD-10-CM | POA: Diagnosis not present

## 2019-12-12 DIAGNOSIS — M25751 Osteophyte, right hip: Secondary | ICD-10-CM | POA: Diagnosis not present

## 2019-12-12 DIAGNOSIS — M25752 Osteophyte, left hip: Secondary | ICD-10-CM | POA: Diagnosis not present

## 2019-12-12 NOTE — Patient Instructions (Addendum)
Thank you for coming in today. Work on Alan Brooks over Figure 4 Standing IT band (bend the left knee a little)  Side leg raises Side leg raise to the back a bit 30 reps 2-3x daily.   Work on muscle groups (glueteus medius and Piriformis) You likely have trochanteric bursitis and ischial burisitis.   The [place] L Protocol for Hamstring Strains  Alan Brooks PT  If not better we can do more. Keep me updated.   Recheck in 6 weeks if not better.

## 2019-12-12 NOTE — Progress Notes (Signed)
Subjective:   I, Alan Brooks, am serving as a scribe for Dr. Lynne Leader.  I'm seeing this patient as a consultation for: Alan Peng, NP . Note will be routed back to referring provider/PCP.  CC: L hip pain   HPI: Patient is a 44 year old Male presenting with L hip pain X9 months. Patient describes the pain as sharp and worse when rolling over at night in bed. His pain is consistently lateral hip.  No radiculitis symptoms.  No numbness.  No weakness.  Has not tried any icing several months.  Pain is worse with position change.  No anterior or medial hip pain. Patient locates pain to two places one being right below the buttock on the L side also in buttock area. States he thinks the change in his gait from plantar fasciitis is what is causing his pain. Patient has tried stretches and Ibuprofen to help pain but they are only temporary relief.  He had trochanteric bursa injection late March with only few day of benefit.  Past medical history, Surgical history, Family history, Social history, Allergies, and medications have been entered into the medical record, reviewed.   Review of Systems: No new headache, visual changes, nausea, vomiting, diarrhea, constipation, dizziness, abdominal pain, skin rash, fevers, chills, night sweats, weight loss, swollen lymph nodes, body aches, joint swelling, muscle aches, chest pain, shortness of breath, mood changes, visual or auditory hallucinations.   Objective:    Vitals:   12/12/19 1316  BP: 116/88  Pulse: 82  SpO2: 97%   General: Well Developed, well nourished, and in no acute distress.  Neuro/Psych: Alert and oriented x3, extra-ocular muscles intact, able to move all 4 extremities, sensation grossly intact. Skin: Warm and dry, no rashes noted.  Respiratory: Not using accessory muscles, speaking in full sentences, trachea midline.  Cardiovascular: Pulses palpable, no extremity edema. Abdomen: Does not appear distended. MSK:  Left hip:  Normal-appearing Normal hip motion. Tender palpation greater trochanter.  Also tender palpation at ischial tuberosity. Strength diminished 4/5 abduction.  4/5 external rotation.  Both are painful. 5/5 strength without pain internal rotation and adduction. Hip extension 4/5 with pain. Resisted knee extension nonpainful normal strength. Mild antalgic gait. Leg length equal  Lab and Radiology Results  X-ray images obtained today AP pelvis personally and independently reviewed Normal-appearing no significant degenerative changes bone sclerosis or DJD. Await formal radiology review  Impression and Recommendations:    Assessment and Plan: 44 y.o. male with left hip pain ongoing for 9 months consistent with trochanteric bursitis and ischial bursitis.  Plan for home exercise program and taught in clinic today by me.  Additionally physical therapy referral placed.  Discussed next steps including trials of injection or MRI.  Recheck 6 weeks or sooner if needed especially if not better.  Contact me sooner if not doing well.  PDMP not reviewed this encounter. Orders Placed This Encounter  Procedures  . DG Pelvis 1-2 Views    Standing Status:   Future    Number of Occurrences:   1    Standing Expiration Date:   02/10/2021    Order Specific Question:   Reason for Exam (SYMPTOM  OR DIAGNOSIS REQUIRED)    Answer:   eval hip pain left    Order Specific Question:   Preferred imaging location?    Answer:   [facility]    Order Specific Question:   Radiology Contrast Protocol - do NOT remove file path    Answer:   \\  [email or web address]  . Ambulatory referral to Physical Therapy    Referral Priority:   Routine    Referral Type:   Physical Medicine    Referral Reason:   Specialty Services Required    Requested Specialty:   Physical Therapy   No orders of the defined types were placed in this encounter.   Discussed warning signs or symptoms. Please see  discharge instructions. Patient expresses understanding.   The above documentation has been reviewed and is accurate and complete Alan Brooks

## 2019-12-13 NOTE — Progress Notes (Signed)
Xray pelvis looks pretty normal.

## 2019-12-21 ENCOUNTER — Ambulatory Visit: Payer: BC Managed Care – PPO | Attending: Family Medicine

## 2019-12-21 ENCOUNTER — Other Ambulatory Visit: Payer: Self-pay

## 2019-12-21 DIAGNOSIS — R252 Cramp and spasm: Secondary | ICD-10-CM | POA: Insufficient documentation

## 2019-12-21 DIAGNOSIS — M25552 Pain in left hip: Secondary | ICD-10-CM | POA: Insufficient documentation

## 2019-12-21 NOTE — Patient Instructions (Signed)
Access Code: [email or web address] URL: [email or web address] Date: 12/21/2019 Prepared by: Tresa Endo  Exercises Seated Hamstring Stretch - 3 x daily - 7 x weekly - 1 sets - 3 reps - 20 hold Seated Figure 4 Piriformis Stretch - 3 x daily - 7 x weekly - 1 sets - 3 reps - 20 hold

## 2019-12-21 NOTE — Therapy (Signed)
[facility] [address] [place], [place], [address] Phone: [phone]   Fax:  [phone]  Physical Therapy Evaluation  Patient Details  Name: Alan Brooks MRN: [id number] Date of Birth: 1975-11-03 Referring Provider (PT): Clementeen Graham, MD   Encounter Date: 12/21/2019  PT End of Session - 12/21/19 1221    Visit Number  1    Date for PT Re-Evaluation  02/15/20    Authorization Type  BCBS    PT Start Time  1147    PT Stop Time  1224    PT Time Calculation (min)  37 min    Activity Tolerance  Patient tolerated treatment well    Behavior During Therapy  [facility] for tasks assessed/performed       Past Medical History:  Diagnosis Date  . Allergy   . Anxiety   . Asthma     History reviewed. No pertinent surgical history.  There were no vitals filed for this visit.   Subjective Assessment - 12/21/19 1150    Subjective  Pt presents to PT with Lt trochanteric and ischial tuberosity bursitis that began 9 months ago.  Pt had plantar fasciitis that began 2 years ago and altered his gait pattern.    Limitations  Sitting;Standing;Walking    How long can you sit comfortably?  1 hour, pain begins, can tolerate 2 hours    How long can you walk comfortably?  1 hour, pain begins.  Can tolerate for 2 hours    Diagnostic tests  x-ray: mild spurring    Patient Stated Goals  improve sleep, reduce pain with standing/walking    Currently in Pain?  Yes    Pain Score  5    up to 8/10   Pain Location  Buttocks    Pain Orientation  Left    Pain Descriptors / Indicators  Sharp;Shooting;Radiating    Pain Type  Chronic pain    Pain Radiating Towards  Lt lateral leg    Pain Onset  More than a month ago    Pain Frequency  Intermittent    Aggravating Factors   sitting, standing, walking, sleep    Pain Relieving Factors  rest, ibuprofen         [facility] PT Assessment - 12/21/19 0001      Assessment   Medical Diagnosis   trochanteric bursitis of the Lt hip, ischial bursitis    Referring Provider (PT)  Clementeen Graham, MD    Onset Date/Surgical Date  04/22/19    Prior Therapy  none      Precautions   Precautions  None      Restrictions   Weight Bearing Restrictions  No      Balance Screen   Has the patient fallen in the past 6 months  No    Has the patient had a decrease in activity level because of a fear of falling?   No    Is the patient reluctant to leave their home because of a fear of falling?   No      Home Environment   Living Environment  Private residence    Living Arrangements  Spouse/significant other    Type of Home  House      Prior Function   Level of Independence  Independent    Vocation  Unemployed    Leisure  karate, boxing- not able to do due to pain      Cognition   Overall Cognitive Status  Within  Functional Limits for tasks assessed      Observation/Other Assessments   Focus on Therapeutic Outcomes (FOTO)   63% limitation      Posture/Postural Control   Posture/Postural Control  Postural limitations    Postural Limitations  Flexed trunk      ROM / Strength   AROM / PROM / Strength  AROM;PROM;Strength      AROM   Overall AROM   Within functional limits for tasks performed      PROM   Overall PROM   Deficits    Overall PROM Comments  hip flexibility limited by 25% bilaterally      Strength   Overall Strength  Within functional limits for tasks performed    Overall Strength Comments  5/5 strength in bil LEs      Palpation   Palpation comment  localized tenderness over Lt trochanteric and ischial bursas and associated soft tissue (gluteals, hamstrings, ITB)      Transfers   Transfers  Independent with all Transfers      Ambulation/Gait   Ambulation/Gait  Yes    Ambulation/Gait Assistance  7: Independent    Gait Pattern  Antalgic   due to bil foot pain   Stairs  Yes    Stairs Assistance  7: Independent                Objective measurements  completed on examination: See above findings.              PT Education - 12/21/19 1220    Education Details  Access Code: [email or web address]    Person(s) Educated  Patient    Methods  Explanation;Demonstration;Handout    Comprehension  Verbalized understanding;Returned demonstration       PT Short Term Goals - 12/21/19 1321      PT SHORT TERM GOAL #1   Title  be independent in initial HEP    Baseline  --    Time  4    Period  Weeks    Status  New    Target Date  01/18/20      PT SHORT TERM GOAL #2   Title  report a 25% reduction in Lt hip and LE pain with standing and walking    Time  4    Period  Weeks    Status  New    Target Date  01/18/20      PT SHORT TERM GOAL #3   Title  sleep with 25% fewer sleep interruptions due to Lt LE pain    Time  4    Status  New    Target Date  01/18/20        PT Long Term Goals - 12/21/19 1323      PT LONG TERM GOAL #1   Title  be independent in advanced HEP    Time  8    Period  Weeks    Target Date  02/15/20      PT LONG TERM GOAL #2   Title  report waking < or = to 1x/night with pain    Time  8    Period  Weeks    Status  New    Target Date  02/15/20      PT LONG TERM GOAL #3   Title  reduce FOTO to < or = to 40% limitation    Time  8    Period  Weeks    Status  New    Target Date  02/15/20      PT LONG TERM GOAL #4   Title  report a 60% reduction in Lt LE pain with sitting, standing and walking    Time  8    Period  Weeks    Status  New    Target Date  02/15/20             Plan - 12/21/19 1319    Clinical Impression Statement  Pt presents to PT with 9 month history of Lt gluteal and lateral hip pain.  Pt has been diagnosed with ischial and trochanteric bursitis.  Pt reports that pain began as result of altered gait pattern associated with chronic plantar fasciitis.  Pt reports 4-8/10 Lt gluteal and lateral thigh pain that disrupts sleep and is worsened with sitting, standing and walking.  Pt with full  strength and bil hip flexibility is limited by 25% bilaterally.  Pt with localized palpable tenderness over greater trochanter and ischial tubersity in addition to associated soft tissue.  Pt will benefit from skilled PT to address pain and improve functional mobility.    Personal Factors and Comorbidities  Comorbidity 1    Comorbidities  bil plantar fasciitis    Examination-Activity Limitations  Sleep;Sit;Locomotion Level;Stairs;Stand;Transfers    Examination-Participation Restrictions  Community Activity;Meal Prep    Stability/Clinical Decision Making  Stable/Uncomplicated    Clinical Decision Making  Low    Rehab Potential  Excellent    PT Frequency  1x / week    PT Duration  8 weeks    PT Treatment/Interventions  ADLs/Self Care Home Management;Cryotherapy;Ultrasound;Moist Heat;Iontophoresis 4mg /ml Dexamethasone;Electrical Stimulation;Gait training;Neuromuscular re-education;Therapeutic exercise;Therapeutic activities;Patient/family education;Passive range of motion;Taping;Vasopneumatic Device;Dry needling    PT Next Visit Plan  dry needling to Lt gluteals, hamstring and ITB, manual therapy, update HEP    PT Home Exercise Plan  [email or web address]    Consulted and Agree with Plan of Care  Patient       Patient will benefit from skilled therapeutic intervention in order to improve the following deficits and impairments:  Abnormal gait, Decreased activity tolerance, Impaired flexibility, Decreased range of motion, Decreased endurance, Difficulty walking, Increased muscle spasms  Visit Diagnosis: Pain in left hip - Plan: PT plan of care cert/re-cert  Cramp and spasm - Plan: PT plan of care cert/re-cert     Problem List Patient Active Problem List   Diagnosis Date Noted  . Thoracic disc herniation 06/11/2018  . Genital warts 10/10/2017  . Rosacea, acne 09/27/2014  . Low back pain 09/27/2014  . Carpal tunnel syndrome of left wrist 01/08/2012  . Anxiety state 05/19/2008  . Asthma 05/19/2008      Sigurd Sos, PT 12/21/19 1:26 PM  [facility] [address], [place], [place], [address] Phone: [phone]   Fax:  [phone]  Name: Alan Brooks MRN: [id number] Date of Birth: 06/10/1976

## 2019-12-27 ENCOUNTER — Ambulatory Visit: Payer: BC Managed Care – PPO | Attending: Family Medicine

## 2019-12-27 ENCOUNTER — Other Ambulatory Visit: Payer: Self-pay

## 2019-12-27 DIAGNOSIS — M546 Pain in thoracic spine: Secondary | ICD-10-CM | POA: Insufficient documentation

## 2019-12-27 DIAGNOSIS — M62830 Muscle spasm of back: Secondary | ICD-10-CM | POA: Diagnosis not present

## 2019-12-27 DIAGNOSIS — R252 Cramp and spasm: Secondary | ICD-10-CM

## 2019-12-27 DIAGNOSIS — M25552 Pain in left hip: Secondary | ICD-10-CM

## 2019-12-27 NOTE — Patient Instructions (Signed)

## 2019-12-27 NOTE — Therapy (Signed)
[facility] [address] [place], [place], [address] Phone: [phone]   Fax:  [phone]  Physical Therapy Treatment  Patient Details  Name: Alan Brooks MRN: [id number] Date of Birth: 01-24-1976 Referring Provider (PT): Clementeen Graham, MD   Encounter Date: 12/27/2019  PT End of Session - 12/27/19 1323    Visit Number  2    Date for PT Re-Evaluation  02/15/20    Authorization Type  BCBS    PT Start Time  1231    PT Stop Time  1318    PT Time Calculation (min)  47 min    Activity Tolerance  Patient tolerated treatment well    Behavior During Therapy  [facility] for tasks assessed/performed       Past Medical History:  Diagnosis Date  . Allergy   . Anxiety   . Asthma     No past surgical history on file.  There were no vitals filed for this visit.  Subjective Assessment - 12/27/19 1233    Subjective  I have been doing my stretches.    Currently in Pain?  No/denies                       [facility] Adult PT Treatment/Exercise - 12/27/19 0001      Exercises   Exercises  Knee/Hip      Knee/Hip Exercises: Stretches   Active Hamstring Stretch  Both;3 reps;20 seconds    Active Hamstring Stretch Limitations  also with lateral component stretch    Piriformis Stretch  3 reps;Both;20 seconds      Manual Therapy   Manual Therapy  Soft tissue mobilization;Myofascial release    Manual therapy comments  elongation and trigger point release to Lt proximal hamstrings, gluteals and ITB       Trigger Point Dry Needling - 12/27/19 0001    Consent Given?  Yes    Education Handout Provided  Yes    Muscles Treated Back/Hip  Gluteus minimus;Gluteus medius;Piriformis;Tensor fascia lata   Lt only   Gluteus Minimus Response  Twitch response elicited;Palpable increased muscle length    Gluteus Medius Response  Twitch response elicited;Palpable increased muscle length    Piriformis Response  Twitch response  elicited;Palpable increased muscle length    Tensor Fascia Lata Response  Twitch response elicited;Palpable increased muscle length           PT Education - 12/27/19 1242    Education Details  DN info    Person(s) Educated  Patient    Methods  Explanation;Demonstration;Handout    Comprehension  Verbalized understanding;Returned demonstration       PT Short Term Goals - 12/21/19 1321      PT SHORT TERM GOAL #1   Title  be independent in initial HEP    Baseline  --    Time  4    Period  Weeks    Status  New    Target Date  01/18/20      PT SHORT TERM GOAL #2   Title  report a 25% reduction in Lt hip and LE pain with standing and walking    Time  4    Period  Weeks    Status  New    Target Date  01/18/20      PT SHORT TERM GOAL #3   Title  sleep with 25% fewer sleep interruptions due to Lt LE pain    Time  4  Status  New    Target Date  01/18/20        PT Long Term Goals - 12/21/19 1323      PT LONG TERM GOAL #1   Title  be independent in advanced HEP    Time  8    Period  Weeks    Target Date  02/15/20      PT LONG TERM GOAL #2   Title  report waking < or = to 1x/night with pain    Time  8    Period  Weeks    Status  New    Target Date  02/15/20      PT LONG TERM GOAL #3   Title  reduce FOTO to < or = to 40% limitation    Time  8    Period  Weeks    Status  New    Target Date  02/15/20      PT LONG TERM GOAL #4   Title  report a 60% reduction in Lt LE pain with sitting, standing and walking    Time  8    Period  Weeks    Status  New    Target Date  02/15/20            Plan - 12/27/19 1322    Clinical Impression Statement  Pt with first time follow-up after evaluation.  Pt has been performing stretching exercises and was performing all aspects correctly today.  Session focused on dry needling to Lt gluteals, ITB and proximal hamstrings.  Pt had good response with multiple trigger points and demonstrated improved tissue mobility after the  session.  PT educated pt on tissue mobilization with a tennis ball at home.  Pt will continue to benefit from skilled PT to address Lt gluteal and hamstring strength and flexibility and manual to address tissue mobility.    PT Frequency  1x / week    PT Duration  8 weeks    PT Treatment/Interventions  ADLs/Self Care Home Management;Cryotherapy;Ultrasound;Moist Heat;Iontophoresis 4mg /ml Dexamethasone;Electrical Stimulation;Gait training;Neuromuscular re-education;Therapeutic exercise;Therapeutic activities;Patient/family education;Passive range of motion;Taping;Vasopneumatic Device;Dry needling    PT Next Visit Plan  assess response to dry needling, add strength to HEP    PT Home Exercise Plan  [email or web address]    Consulted and Agree with Plan of Care  Patient       Patient will benefit from skilled therapeutic intervention in order to improve the following deficits and impairments:  Abnormal gait, Decreased activity tolerance, Impaired flexibility, Decreased range of motion, Decreased endurance, Difficulty walking, Increased muscle spasms  Visit Diagnosis: Pain in left hip  Cramp and spasm  Pain in thoracic spine  Muscle spasm of back     Problem List Patient Active Problem List   Diagnosis Date Noted  . Thoracic disc herniation 06/11/2018  . Genital warts 10/10/2017  . Rosacea, acne 09/27/2014  . Low back pain 09/27/2014  . Carpal tunnel syndrome of left wrist 01/08/2012  . Anxiety state 05/19/2008  . Asthma 05/19/2008     Sigurd Sos, PT 12/27/19 1:25 PM  [facility] [address] [place], [place], [address] Phone: [phone]   Fax:  [phone]  Name: Alan Brooks MRN: [id number] Date of Birth: 04-30-1976

## 2020-01-04 ENCOUNTER — Ambulatory Visit: Payer: BC Managed Care – PPO

## 2020-01-04 ENCOUNTER — Other Ambulatory Visit: Payer: Self-pay

## 2020-01-04 DIAGNOSIS — R252 Cramp and spasm: Secondary | ICD-10-CM

## 2020-01-04 DIAGNOSIS — M62830 Muscle spasm of back: Secondary | ICD-10-CM | POA: Diagnosis not present

## 2020-01-04 DIAGNOSIS — M546 Pain in thoracic spine: Secondary | ICD-10-CM | POA: Diagnosis not present

## 2020-01-04 DIAGNOSIS — M25552 Pain in left hip: Secondary | ICD-10-CM

## 2020-01-04 NOTE — Therapy (Signed)
[facility] [address] [place], [place], [address] Phone: [phone]   Fax:  [phone]  Physical Therapy Treatment  Patient Details  Name: Alan Brooks MRN: [id number] Date of Birth: August 26, 1975 Referring Provider (PT): Clementeen Graham, MD   Encounter Date: 01/04/2020  PT End of Session - 01/04/20 0843    Visit Number  3    Date for PT Re-Evaluation  02/15/20    Authorization Type  BCBS    PT Start Time  0800    PT Stop Time  0843    PT Time Calculation (min)  43 min    Activity Tolerance  Patient tolerated treatment well    Behavior During Therapy  [facility] for tasks assessed/performed       Past Medical History:  Diagnosis Date  . Allergy   . Anxiety   . Asthma     History reviewed. No pertinent surgical history.  There were no vitals filed for this visit.  Subjective Assessment - 01/04/20 0803    Subjective  I felt better after dry needling and now back is tight.  I had LBP up to 8/10 last week.    Currently in Pain?  No/denies   no hip pain.  LBP is 4/10   Pain Score  --   low back is tight.                      [facility] Adult PT Treatment/Exercise - 01/04/20 0001      Exercises   Exercises  Lumbar      Lumbar Exercises: Stretches   Lower Trunk Rotation  3 reps;20 seconds    Piriformis Stretch  3 reps;Both;20 seconds    Piriformis Stretch Limitations  supine-diagonal knee to opposite shoulder      Lumbar Exercises: Supine   Ab Set  20 reps;5 seconds    AB Set Limitations  ball squeeze      Lumbar Exercises: Prone   Other Prone Lumbar Exercises  childs pose: 2x20 seconds      Knee/Hip Exercises: Sidelying   Clams  2x10 with abdominal bracing      Manual Therapy   Manual Therapy  Soft tissue mobilization;Myofascial release    Manual therapy comments  elongation and trigger point release to Lt proximal hamstrings, gluteals and ITB    Soft tissue mobilization  bil lumbar  paraspinals       Trigger Point Dry Needling - 01/04/20 0001    Consent Given?  Yes    Education Handout Provided  Previously provided    Muscles Treated Back/Hip  Gluteus minimus;Gluteus medius;Piriformis;Lumbar multifidi   Lt only   Gluteus Minimus Response  Twitch response elicited;Palpable increased muscle length    Gluteus Medius Response  Twitch response elicited;Palpable increased muscle length    Piriformis Response  Twitch response elicited;Palpable increased muscle length    Tensor Fascia Lata Response  --    Lumbar multifidi Response  Twitch response elicited;Palpable increased muscle length   bilateral L1-2          PT Education - 01/04/20 0819    Education Details  Access Code: [email or web address]    Person(s) Educated  Patient    Methods  Explanation;Demonstration;Handout    Comprehension  Verbalized understanding;Returned demonstration       PT Short Term Goals - 01/04/20 0804      PT SHORT TERM GOAL #1   Title  be independent in initial HEP  Status  Achieved        PT Long Term Goals - 12/21/19 1323      PT LONG TERM GOAL #1   Title  be independent in advanced HEP    Time  8    Period  Weeks    Target Date  02/15/20      PT LONG TERM GOAL #2   Title  report waking < or = to 1x/night with pain    Time  8    Period  Weeks    Status  New    Target Date  02/15/20      PT LONG TERM GOAL #3   Title  reduce FOTO to < or = to 40% limitation    Time  8    Period  Weeks    Status  New    Target Date  02/15/20      PT LONG TERM GOAL #4   Title  report a 60% reduction in Lt LE pain with sitting, standing and walking    Time  8    Period  Weeks    Status  New    Target Date  02/15/20            Plan - 01/04/20 0815    Clinical Impression Statement  Pt had symptom reduction in Lt hip after dry needling last session.  Pt had a flare-up of LBP over the past few days.  PT added HEP for core and hip strength and flexibility to address both lumbar and  hip region.  Pt demonstrated hip weakness with sidelying clams and required minor cues for technique.  Pt with tension and trigger points in bil lumbar paraspinals and Lt hip musculature. Pt only tolerated dry needling to levels L1-L2 and requested to stop due significant lumbar pain.  Pt demonstrated improved tissue mobility and reduced pain/stiffness after dry needling today.  Pt will continue to benefit from skilled PT to address Lt hip pain, strength and tissue mobility.    Rehab Potential  Excellent    PT Frequency  1x / week    PT Duration  8 weeks    PT Treatment/Interventions  ADLs/Self Care Home Management;Cryotherapy;Ultrasound;Moist Heat;Iontophoresis 4mg /ml Dexamethasone;Electrical Stimulation;Gait training;Neuromuscular re-education;Therapeutic exercise;Therapeutic activities;Patient/family education;Passive range of motion;Taping;Vasopneumatic Device;Dry needling    PT Next Visit Plan  assess response to dry needling and repeat if helpful, review HEP, continue manual and flexiblity/strength progression    PT Home Exercise Plan  [email or web address]    Consulted and Agree with Plan of Care  Patient       Patient will benefit from skilled therapeutic intervention in order to improve the following deficits and impairments:  Abnormal gait, Decreased activity tolerance, Impaired flexibility, Decreased range of motion, Decreased endurance, Difficulty walking, Increased muscle spasms  Visit Diagnosis: Cramp and spasm  Pain in thoracic spine  Pain in left hip     Problem List Patient Active Problem List   Diagnosis Date Noted  . Thoracic disc herniation 06/11/2018  . Genital warts 10/10/2017  . Rosacea, acne 09/27/2014  . Low back pain 09/27/2014  . Carpal tunnel syndrome of left wrist 01/08/2012  . Anxiety state 05/19/2008  . Asthma 05/19/2008     Sigurd Sos, PT 01/04/20 8:44 AM   [facility] [address], [place], [place], [address] Phone: [phone]   Fax:  [phone]  Name: DAQUANE AGUILAR MRN: [id number] Date of Birth: 18-Jul-1976

## 2020-01-04 NOTE — Patient Instructions (Signed)
Access Code: [email or web address] URL: [email or web address] Date: 12/21/2019 Prepared by: Kai Levins - 1 x daily - 7 x weekly - 2 sets - 10 reps Supine Piriformis Stretch with Leg Straight - 2 x daily - 7 x weekly - 1 sets - 3 reps - 20 hold Supine Hip Adduction Isometric with Ball - 1 x daily - 7 x weekly - 1 sets - 10 reps - 5 hold

## 2020-01-05 ENCOUNTER — Other Ambulatory Visit: Payer: Self-pay

## 2020-01-06 ENCOUNTER — Encounter: Payer: Self-pay | Admitting: Adult Health

## 2020-01-06 ENCOUNTER — Other Ambulatory Visit: Payer: Self-pay | Admitting: Adult Health

## 2020-01-06 ENCOUNTER — Ambulatory Visit (INDEPENDENT_AMBULATORY_CARE_PROVIDER_SITE_OTHER): Payer: BC Managed Care – PPO | Admitting: Adult Health

## 2020-01-06 ENCOUNTER — Other Ambulatory Visit (HOSPITAL_COMMUNITY)
Admission: RE | Admit: 2020-01-06 | Discharge: 2020-01-06 | Disposition: A | Discharge status: Home / Self Care | Payer: BC Managed Care – PPO | Source: Ambulatory Visit | Attending: Adult Health | Admitting: Adult Health

## 2020-01-06 VITALS — BP 110/80 | Temp 98.0°F | Ht 74.5 in | Wt 196.0 lb

## 2020-01-06 DIAGNOSIS — Z202 Contact with and (suspected) exposure to infections with a predominantly sexual mode of transmission: Secondary | ICD-10-CM | POA: Diagnosis not present

## 2020-01-06 DIAGNOSIS — M25552 Pain in left hip: Secondary | ICD-10-CM

## 2020-01-06 DIAGNOSIS — F411 Generalized anxiety disorder: Secondary | ICD-10-CM | POA: Diagnosis not present

## 2020-01-06 DIAGNOSIS — Z0000 Encounter for general adult medical examination without abnormal findings: Secondary | ICD-10-CM

## 2020-01-06 DIAGNOSIS — J4520 Mild intermittent asthma, uncomplicated: Secondary | ICD-10-CM

## 2020-01-06 DIAGNOSIS — Z711 Person with feared health complaint in whom no diagnosis is made: Secondary | ICD-10-CM

## 2020-01-06 LAB — COMPREHENSIVE METABOLIC PANEL
ALT: 18 U/L (ref 0–53)
AST: 16 U/L (ref 0–37)
Albumin: 4.1 g/dL (ref 3.5–5.2)
Alkaline Phosphatase: 62 U/L (ref 39–117)
BUN: 12 mg/dL (ref 6–23)
CO2: 30 mEq/L (ref 19–32)
Calcium: 9 mg/dL (ref 8.4–10.5)
Chloride: 104 mEq/L (ref 96–112)
Creatinine, Ser: 0.99 mg/dL (ref 0.40–1.50)
GFR: 82.9 mL/min (ref >60.00)
Glucose, Bld: 95 mg/dL (ref 70–99)
Potassium: 4.7 mEq/L (ref 3.5–5.1)
Sodium: 138 mEq/L (ref 135–145)
Total Bilirubin: 0.6 mg/dL (ref 0.2–1.2)
Total Protein: 6.5 g/dL (ref 6.0–8.3)

## 2020-01-06 LAB — CBC WITH DIFFERENTIAL/PLATELET
Basophils Absolute: 0 10*3/uL (ref 0.0–0.1)
Basophils Relative: 1 % (ref 0.0–3.0)
Eosinophils Absolute: 0.3 10*3/uL (ref 0.0–0.7)
Eosinophils Relative: 5.8 % — ABNORMAL HIGH (ref 0.0–5.0)
HCT: 46.1 % (ref 39.0–52.0)
Hemoglobin: 15.5 g/dL (ref 13.0–17.0)
Lymphocytes Relative: 30.9 % (ref 12.0–46.0)
Lymphs Abs: 1.6 10*3/uL (ref 0.7–4.0)
MCHC: 33.6 g/dL (ref 30.0–36.0)
MCV: 91.9 fl (ref 78.0–100.0)
Monocytes Absolute: 0.5 10*3/uL (ref 0.1–1.0)
Monocytes Relative: 8.9 % (ref 3.0–12.0)
Neutro Abs: 2.7 10*3/uL (ref 1.4–7.7)
Neutrophils Relative %: 53.4 % (ref 43.0–77.0)
Platelets: 233 10*3/uL (ref 150.0–400.0)
RBC: 5.02 Mil/uL (ref 4.22–5.81)
RDW: 14.4 % (ref 11.5–15.5)
WBC: 5.1 10*3/uL (ref 4.0–10.5)

## 2020-01-06 LAB — LIPID PANEL
Cholesterol: 214 mg/dL — ABNORMAL HIGH (ref 0–200)
HDL: 47.7 mg/dL (ref >39.00)
LDL Cholesterol: 151 mg/dL — ABNORMAL HIGH (ref 0–99)
NonHDL: 166.13
Total CHOL/HDL Ratio: 4
Triglycerides: 77 mg/dL (ref 0.0–149.0)
VLDL: 15.4 mg/dL (ref 0.0–40.0)

## 2020-01-06 LAB — TSH: TSH: 1.56 u[IU]/mL (ref 0.35–4.50)

## 2020-01-06 MED ORDER — PANTOPRAZOLE SODIUM 40 MG PO TBEC: 40.0000 mg | DELAYED_RELEASE_TABLET | Freq: Every day | ORAL | 3 refills | Status: DC

## 2020-01-06 NOTE — Progress Notes (Signed)
Subjective:    Patient ID: Alan Brooks, male    DOB: 11/27/1975, 44 y.o.   MRN: [id number]  HPI Patient presents for yearly preventative medicine examination. He is a pleasant 44 year old male who  has a past medical history of Allergy, Anxiety, and Asthma.   Asthma -well controlled with Wixela.  He does have a rescue inhaler but uses infrequently  Anxiety - Celexa 40 mg same well-controlled  Left Hip Pain -has been evaluated by sports medicine and diagnosed which Trochanteric bursitis and iscchial bursitis.  He has been going to physical therapy for the last 3 weeks but is not sure how much of a difference this is making.  He does have a follow-up with sports medicine on June 1  Concern for STD -reports that he received a call yesterday night from a partner notifying him that she had to be tested for gonorrhea because another person that she slept with tested positive.  He denies any signs or symptoms but would like to be checked.  He last had sexual intercourse with this partner about 3 weeks ago  All immunizations and health maintenance protocols were reviewed with the patient and needed orders were placed.  Appropriate screening laboratory values were ordered for the patient including screening of hyperlipidemia, renal function and hepatic function.  Medication reconciliation,  past medical history, social history, problem list and allergies were reviewed in detail with the patient  Goals were established with regard to weight loss, exercise, and  diet in compliance with medications Wt Readings from Last 3 Encounters:  01/06/20 196 lb (88.9 kg)  12/12/19 199 lb (90.3 kg)  12/01/19 195 lb (88.5 kg)     Review of Systems  Constitutional: Negative.   HENT: Negative.   Eyes: Negative.   Respiratory: Negative.   Cardiovascular: Negative.   Gastrointestinal: Negative.   Endocrine: Negative.   Genitourinary: Negative.   Musculoskeletal: Positive for arthralgias and  myalgias.  Skin: Negative.   Allergic/Immunologic: Negative.   Neurological: Negative.   Hematological: Negative.   Psychiatric/Behavioral: Negative.   All other systems reviewed and are negative.  Past Medical History:  Diagnosis Date  . Allergy   . Anxiety   . Asthma     Social History   Socioeconomic History  . Marital status: Single    Spouse name: Not on file  . Number of children: Not on file  . Years of education: Not on file  . Highest education level: Not on file  Occupational History  . Not on file  Tobacco Use  . Smoking status: Former Smoker    Packs/day: 1.00    Years: 15.00    Pack years: 15.00    Types: Cigarettes  . Smokeless tobacco: Never Used  Substance and Sexual Activity  . Alcohol use: No  . Drug use: No  . Sexual activity: Not on file  Other Topics Concern  . 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 Difficulty of Paying Living Expenses:   Food Insecurity:   . Worried About Charity fundraiser in the Last Year:   . Arboriculturist in the Last Year:   Transportation Needs:   . Film/video editor (Medical):   Marland Kitchen Lack of Transportation (Non-Medical):  Physical Activity:   . Days of Exercise per Week:   . Minutes of Exercise per Session:   Stress:   . Feeling of Stress :   Social Connections:   . Frequency of Communication with Friends and Family:   . Frequency of Social Gatherings with Friends and Family:   . Attends Religious Services:   . Active Member of Clubs or Organizations:   . Attends Banker Meetings:   Marland Kitchen Marital Status:   Intimate Partner Violence:   . Fear of Current or Ex-Partner:   . Emotionally Abused:   Marland Kitchen Physically Abused:   . Sexually Abused:     History reviewed. No pertinent surgical history.  Family History  Problem  Relation Age of Onset  . Heart attack Maternal Grandfather   . Heart attack Paternal Grandfather   . Asthma Brother   . Depression Other        entire family   . Anxiety disorder Other        entire family     No Known Allergies  Current Outpatient Medications on File Prior to Visit  Medication Sig Dispense Refill  . citalopram (CELEXA) 40 MG tablet TAKE 1 TABLET BY MOUTH ONCE DAILY 90 tablet 0  . Fluticasone-Salmeterol (WIXELA INHUB) 250-50 MCG/DOSE AEPB Inhale 1 puff into the lungs 2 (two) times daily. 60 each 11  . hydrocortisone (ANUSOL-HC) 25 MG suppository Place 1 suppository (25 mg total) rectally 2 (two) times daily as needed for hemorrhoids or anal itching. 12 suppository 3  . hydrocortisone cream 1 % Apply 1 application topically 2 (two) times daily. 30 g 0  . pantoprazole (PROTONIX) 40 MG tablet TAKE 1 TABLET BY MOUTH ONCE DAILY BEFORE BREAKFAST 90 tablet 3  . VENTOLIN HFA 108 (90 Base) MCG/ACT inhaler INHALE 2 PUFFS BY MOUTH INTO THE LUNGS EVERY 6 HOURS AS NEEDED FOR WHEEZING OR SHORTNESS OF BREATH 18 g 2   No current facility-administered medications on file prior to visit.    BP 110/80   Temp 98 F (36.7 C)   Ht 6' 2.5" (1.892 m)   Wt 196 lb (88.9 kg)   BMI 24.83 kg/m       Objective:   Physical Exam Vitals and nursing note reviewed.  Constitutional:      General: He is not in acute distress.    Appearance: Normal appearance. He is well-developed and normal weight.  HENT:     Head: Normocephalic and atraumatic.     Right Ear: Tympanic membrane, ear canal and external ear normal. There is no impacted cerumen.     Left Ear: Tympanic membrane, ear canal and external ear normal. There is no impacted cerumen.     Nose: Nose normal. No congestion or rhinorrhea.     Mouth/Throat:     Mouth: Mucous membranes are moist.     Pharynx: Oropharynx is clear. No oropharyngeal exudate or posterior oropharyngeal erythema.  Eyes:     General:        Right eye: No  discharge.        Left eye: No discharge.     Extraocular Movements: Extraocular movements intact.     Conjunctiva/sclera: Conjunctivae normal.     Pupils: Pupils are equal, round, and reactive to light.  Neck:     Vascular: No carotid bruit.     Trachea: No tracheal deviation.  Cardiovascular:     Rate and Rhythm: Normal rate and regular rhythm.     Pulses: Normal pulses.  Heart sounds: Normal heart sounds. No murmur. No friction rub. No gallop.   Pulmonary:     Effort: Pulmonary effort is normal. No respiratory distress.     Breath sounds: Normal breath sounds. No stridor. No wheezing, rhonchi or rales.  Chest:     Chest wall: No tenderness.  Abdominal:     General: Bowel sounds are normal. There is no distension.     Palpations: Abdomen is soft. There is no mass.     Tenderness: There is no abdominal tenderness. There is no right CVA tenderness, left CVA tenderness, guarding or rebound.     Hernia: No hernia is present.  Musculoskeletal:        General: No swelling, tenderness, deformity or signs of injury. Normal range of motion.     Right lower leg: No edema.     Left lower leg: No edema.  Lymphadenopathy:     Cervical: No cervical adenopathy.  Skin:    General: Skin is warm and dry.     Capillary Refill: Capillary refill takes less than 2 seconds.     Coloration: Skin is not jaundiced or pale.     Findings: No bruising, erythema, lesion or rash.  Neurological:     General: No focal deficit present.     Mental Status: He is alert and oriented to person, place, and time.     Cranial Nerves: No cranial nerve deficit.     Sensory: No sensory deficit.     Motor: No weakness.     Coordination: Coordination normal.     Gait: Gait normal.     Deep Tendon Reflexes: Reflexes normal.  Psychiatric:        Mood and Affect: Mood normal.        Behavior: Behavior normal.        Thought Content: Thought content normal.        Judgment: Judgment normal.       Assessment &  Plan:  1. Routine general medical examination at a health care facility  - CBC with Differential/Platelet - Comprehensive metabolic panel - Lipid panel - TSH   2. Concern about STD in male without diagnosis -Since he is asymptomatic will hold off on treating at this time and wait for urine cytology to come back - Urine cytology ancillary only - HIV Antibody (routine testing w rflx) - RPR - Encouraged safe sex practices   3. Left hip pain -Follow-up with physical therapy and sports medicine as directed  4. Anxiety state -Continue with Celexa  5. Mild intermittent asthma without complication -Continue with with Wixela and albuterol as needed  Shirline Frees, NP

## 2020-01-09 LAB — RPR: RPR Ser Ql: NONREACTIVE

## 2020-01-09 LAB — URINE CYTOLOGY ANCILLARY ONLY
Chlamydia: NEGATIVE
Comment: NEGATIVE
Comment: NEGATIVE
Comment: NORMAL
Neisseria Gonorrhea: NEGATIVE
Trichomonas: NEGATIVE

## 2020-01-09 LAB — HIV ANTIBODY (ROUTINE TESTING W REFLEX): HIV 1&2 Ab, 4th Generation: NONREACTIVE

## 2020-01-18 ENCOUNTER — Ambulatory Visit: Payer: BC Managed Care – PPO | Admitting: Physical Therapy

## 2020-01-18 ENCOUNTER — Encounter: Payer: Self-pay | Admitting: Physical Therapy

## 2020-01-18 ENCOUNTER — Other Ambulatory Visit: Payer: Self-pay

## 2020-01-18 DIAGNOSIS — M546 Pain in thoracic spine: Secondary | ICD-10-CM | POA: Diagnosis not present

## 2020-01-18 DIAGNOSIS — R252 Cramp and spasm: Secondary | ICD-10-CM

## 2020-01-18 DIAGNOSIS — M62830 Muscle spasm of back: Secondary | ICD-10-CM

## 2020-01-18 DIAGNOSIS — M25552 Pain in left hip: Secondary | ICD-10-CM

## 2020-01-18 NOTE — Patient Instructions (Signed)
Access Code: [email or web address]: [email or web address]: 05/26/2021Prepared by: [facility]  Clamshell - 1 x daily - 7 x weekly - 2 sets - 10 reps  Supine Hip Adduction Isometric with Ball - 1 x daily - 7 x weekly - 1 sets - 10 reps - 5 hold  Supine Hamstring Stretch with Doorway - 1 x daily - 7 x weekly - 2 sets - 5 reps - 5 seconds hold  Bridge with Heels on [employer] - 1 x daily - 7 x weekly - 2-3 sets - 10 reps  Arch Lifting - 1 x daily - 7 x weekly - 10 reps - 5 seconds hold  Seated Self Great Toe Stretch - 1 x daily - 7 x weekly - 5-10 reps - 10 sec hold  Standing Gastroc Stretch - 1 x daily - 7 x weekly - 3 reps - 30 seconds hold  [facility] [address], [address] [place], [place] [address] Phone # [phone] Fax [phone]

## 2020-01-18 NOTE — Therapy (Signed)
[facility] [address], [place], [place], [address] Phone: [phone]   Fax:  [phone]  Physical Therapy Treatment  Patient Details  Name: Alan Brooks MRN: [id number] Date of Birth: Oct 26, 1975 Referring Provider (PT): Lynne Leader, MD   Encounter Date: 01/18/2020  PT End of Session - 01/18/20 0825    Visit Number  4    Date for PT Re-Evaluation  02/15/20    Authorization Type  BCBS    PT Start Time  0800    PT Stop Time  0843    PT Time Calculation (min)  43 min    Activity Tolerance  Patient tolerated treatment well    Behavior During Therapy  [facility] for tasks assessed/performed       Past Medical History:  Diagnosis Date  . Allergy   . Anxiety   . Asthma     History reviewed. No pertinent surgical history.  There were no vitals filed for this visit.  Subjective Assessment - 01/18/20 0802    Subjective  Pt states that he is having a rough day today. His back started bothering him again 2 days after the dry needling. His Lt hip is doing good but his "sits bone" area is still hurting. His biggest issue right now is his upper back or foot.    Currently in Pain?  Other (Comment)   upper back and BLE plantar fasciatis                       [facility] Adult PT Treatment/Exercise - 01/18/20 0001      Knee/Hip Exercises: Stretches   Active Hamstring Stretch  Left;5 reps    Active Hamstring Stretch Limitations  5 sec hold end range    Other Knee/Hip Stretches  Lt and Rt great toe stretch 3x10 sec hold HEP demo       Knee/Hip Exercises: Seated   Other Seated Knee/Hip Exercises  B arch lift 10x3 sec hold       Knee/Hip Exercises: Supine   Bridges  Strengthening;2 sets;10 reps    Bridges Limitations  straight leg on red physioball     Other Supine Knee/Hip Exercises  bridge with hamstring curl: feet on red physioball 2x10 reps              PT Education - 01/18/20 0825    Education Details  technique with therex    Person(s) Educated  Patient    Methods  Explanation;Handout;Verbal cues    Comprehension  Verbalized understanding;Returned demonstration       PT Short Term Goals - 01/04/20 0804      PT SHORT TERM GOAL #1   Title  be independent in initial HEP    Status  Achieved        PT Long Term Goals - 12/21/19 1323      PT LONG TERM GOAL #1   Title  be independent in advanced HEP    Time  8    Period  Weeks    Target Date  02/15/20      PT LONG TERM GOAL #2   Title  report waking < or = to 1x/night with pain    Time  8    Period  Weeks    Status  New    Target Date  02/15/20      PT LONG TERM GOAL #3   Title  reduce FOTO to < or = to 40% limitation  Time  8    Period  Weeks    Status  New    Target Date  02/15/20      PT LONG TERM GOAL #4   Title  report a 60% reduction in Lt LE pain with sitting, standing and walking    Time  8    Period  Weeks    Status  New    Target Date  02/15/20            Plan - 01/18/20 7253    Clinical Impression Statement  Pt's Lt hip is doing well recently, but he still has pain in the area of the Lt ischial tuberosity. Pt's passive straight leg raise is limited compared to the Rt side. PT made some updates to his HEP to include hamstring flexibility and strength. Pt's gait likely has a negative impact on his hip discomfort, so he was provided 2 exercises to address great toe flexibility and arch strength. Pt performed all of today's exercises without increase in his pain, but there was evident muscle shaking and fatigue. Will continue with current POC.    Rehab Potential  Excellent    PT Frequency  1x / week    PT Duration  8 weeks    PT Treatment/Interventions  ADLs/Self Care Home Management;Cryotherapy;Ultrasound;Moist Heat;Iontophoresis 4mg /ml Dexamethasone;Electrical Stimulation;Gait training;Neuromuscular re-education;Therapeutic exercise;Therapeutic activities;Patient/family  education;Passive range of motion;Taping;Vasopneumatic Device;Dry needling    PT Next Visit Plan  f/u on HEP, hip flexor stretch, gait training to avoid antalgic pattern, ice/rolling plantar fascia for improved mechanics, standing deadlift    PT Home Exercise Plan  [email or web address]    Consulted and Agree with Plan of Care  Patient       Patient will benefit from skilled therapeutic intervention in order to improve the following deficits and impairments:  Abnormal gait, Decreased activity tolerance, Impaired flexibility, Decreased range of motion, Decreased endurance, Difficulty walking, Increased muscle spasms  Visit Diagnosis: Cramp and spasm  Pain in thoracic spine  Pain in left hip  Muscle spasm of back     Problem List Patient Active Problem List   Diagnosis Date Noted  . Thoracic disc herniation 06/11/2018  . Genital warts 10/10/2017  . Rosacea, acne 09/27/2014  . Low back pain 09/27/2014  . Carpal tunnel syndrome of left wrist 01/08/2012  . Anxiety state 05/19/2008  . Asthma 05/19/2008    8:49 AM,01/18/20 01/20/20 PT, DPT [facility]  [phone]  [facility] [address], [address] [place], [place], [place] Phone: [phone]   Fax:  [phone]  Name: Alan Brooks MRN: Beatris Ship Date of Birth: [birth year]

## 2020-01-20 ENCOUNTER — Encounter: Payer: Self-pay | Admitting: Adult Health

## 2020-01-24 ENCOUNTER — Other Ambulatory Visit: Payer: Self-pay

## 2020-01-24 ENCOUNTER — Other Ambulatory Visit: Payer: Self-pay | Admitting: Family Medicine

## 2020-01-24 ENCOUNTER — Ambulatory Visit: Payer: BC Managed Care – PPO | Admitting: Family Medicine

## 2020-01-24 ENCOUNTER — Encounter: Payer: Self-pay | Admitting: Family Medicine

## 2020-01-24 VITALS — BP 102/78 | HR 83 | Ht 74.5 in | Wt 194.0 lb

## 2020-01-24 DIAGNOSIS — L409 Psoriasis, unspecified: Secondary | ICD-10-CM

## 2020-01-24 DIAGNOSIS — M2550 Pain in unspecified joint: Secondary | ICD-10-CM

## 2020-01-24 DIAGNOSIS — M545 Low back pain, unspecified: Secondary | ICD-10-CM

## 2020-01-24 DIAGNOSIS — M542 Cervicalgia: Secondary | ICD-10-CM

## 2020-01-24 DIAGNOSIS — M722 Plantar fascial fibromatosis: Secondary | ICD-10-CM

## 2020-01-24 LAB — CK: Total CK: 100 U/L (ref 7–232)

## 2020-01-24 LAB — SEDIMENTATION RATE: Sed Rate: 7 mm/hr (ref 0–15)

## 2020-01-24 MED ORDER — CLOBETASOL PROPIONATE 0.05 % EX OINT
1.0000 | TOPICAL_OINTMENT | Freq: Two times a day (BID) | CUTANEOUS | 4 refills | Status: DC
Start: 2020-01-24 — End: 2021-06-27

## 2020-01-24 MED ORDER — FLUOCINONIDE 0.05 % EX SOLN: 1.0000 "application " | Freq: Two times a day (BID) | CUTANEOUS | 4 refills | Status: DC

## 2020-01-24 MED FILL — FLUOCINONIDE 0.05 % SOLN: 0.05 | 20 days supply | Qty: 60 | Fill #0

## 2020-01-24 MED FILL — BREO ELLIPTA 200-25 MCG INH: 200-25 | 30 days supply | Qty: 60 | Fill #0

## 2020-01-24 MED FILL — CLOBETASOL PROPIONATE 0.05: 0.05 | 30 days supply | Qty: 60 | Fill #0

## 2020-01-24 NOTE — Patient Instructions (Addendum)
Plan to continue PT.  Get labs today to evaluate for psoriatic arthritis.  Add the heel exercises for plantar fasciitis.  30 reps 2-3x daily.  Go slow.  Use the cream and solution for the rash and scalp.  Alan Brooks will take over for psoriasis in the future.  If rheumatology stuff comes back positive or if not better I will refer to rheumatology.  Recheck in 1 month.    Psoriatic Arthritis Psoriatic arthritis is a long-term (chronic) condition that causes pain, swelling, and stiffness in the joints. The large joints of the legs, hips, and pelvis are most often affected. The joints in the neck and back may also be affected. Sometimes psoriatic arthritis can involve joints in the toes, fingers, wrists, and elbows. Most people with psoriatic arthritis have a chronic skin disease that causes itchy scales and patches to form on the skin (psoriasis) before they develop psoriatic arthritis. In some cases, a person may have psoriatic arthritis before or without having psoriasis. Psoriatic arthritis can be mild or severe. It may come and go or cause symptoms all the time. It may affect one joint, a few joints, or many joints. In severe cases, untreated psoriatic arthritis can cause joint damage. What are the causes? The exact cause of this condition is not known. Psoriatic arthritis is an autoimmune disease. With this type of disease, the body's defense system (immune system) mistakenly attacks healthy tissues. If you have psoriasis, the immune system attacks the skin. With psoriatic arthritis, the immune system attacks joints and the tissues that connect muscles to joints (tendons). The disease may be activated by a trigger, such as an infection or stress. What increases the risk? You are more likely to develop this condition if:  You have psoriasis. This is the biggest risk factor. Most people who develop psoriatic arthritis have had psoriasis for 5 to 10 years.  You have a family history of  psoriasis or psoriatic arthritis.  You are between the ages of 54 and 36. What are the signs or symptoms? The main symptom of this condition is inflammation of joints and tendons. Other symptoms may include:  Joint swelling.  Joint pain.  Joint stiffness, especially in the morning.  Swollen fingers and toes.  Pain in areas where tendons connect to bones.  Pain in the heel or sole of the foot.  Pitted and weak nails.  Tiredness (fatigue).  Eye redness. How is this diagnosed? This condition may be diagnosed based on:  Your symptoms and medical history.  A physical exam.  X-rays to look for joint inflammation or damage, especially in the joints of the pelvis (sacroiliac joints).  Other imaging tests, such as a CT scan or MRI.  Blood tests to look for inflammation and to rule out other causes of joint inflammation, such as gout or rheumatoid arthritis. How is this treated? The goal of treatment is to reduce pain and inflammation and protect joints from damage. Treatment depends on how severe the inflammation is and how many joints are affected. Treatment may include:  Medicines, such as: ? NSAIDs to relieve pain and inflammation. For people with mild disease, these may be the only medicines needed. ? Disease-modifying antirheumatic drugs (DMARDs). ? Biologic medicines. These may be used for severe inflammation or if other medicines are not working. These medicines are usually given as injections or through an IV. They are very effective for many people with inflammatory arthritis, but there is an increased risk of infection.  Physical therapy and  other exercise to strengthen muscles that support joints and to prevent joint stiffness.  A brace or splint to support a painful and swollen joint.  Surgery to reconstruct or replace a joint. This may be an option for people with severe joint damage if other treatments have not helped. Follow these instructions at  home: Medicines  Take over-the-counter and prescription medicines only as told by your health care provider.  Be aware of the possible side effects of your medicine and know when to call your health care provider.  If you are taking a biologic, let your health care provider know if you have signs or symptoms of an infection. You may need to stop treatment until your infection clears up. Managing pain, stiffness, and swelling   If directed, put ice on painful areas. ? Put ice in a plastic bag. ? Place a towel between your skin and the bag. ? Leave the ice on for 20 minutes, 2-3 times a day. If you have a brace or splint:  Wear the brace or splint as told by your health care provider. Remove it only as told by your health care provider.  Loosen the brace or splint if your fingers or toes tingle, become numb, or turn cold and blue.  Keep the brace or splint clean.  If the brace or splint is not waterproof: ? Do not let it get wet. ? Cover it with a watertight covering when you take a bath or shower. Activity  Return to your normal activities as told by your health care provider. Ask your health care provider what activities are safe for you.  Get regular exercise. Ask your health care provider what type of exercise is best for you. Your health care provider may recommend: ? Low-impact exercises such as walking, biking, or swimming. ? Exercises that include stretching, such as tai chi and yoga.  Do not exercise when you have a flare of symptoms. Rest until the symptoms improve. Eating and drinking   Do not drink alcohol if you are taking an NSAID. Alcohol and NSAIDs can cause stomach irritation.  Eat a healthy diet that includes plenty of vegetables, fruits, whole grains, low-fat dairy products, and lean protein. Do not eat a lot of foods that are high in solid fats, added sugars, or salt. General instructions  Do not use any products that contain nicotine or tobacco, such as  cigarettes, e-cigarettes, and chewing tobacco. These can make arthritis worse. If you need help quitting, ask your health care provider.  Maintain a healthy weight. A healthy weight will help you stay active and take stress off your joints.  Stay up to date on all immunizations, including the yearly (annual) flu vaccine.  Keep all follow-up visits as told by your health care provider. This is important. Where to find more information  American Academy of Dermatology: http://jones-macias.info/  American College of Rheumatology: www.rheumatology.[place]: www.psoriasis.org Contact a health care provider if:  Your signs and symptoms flare up.  You have side effects from your medicines.  You are taking a biologic and you have a fever or other signs of infection, such as: ? Chills. ? Feeling tired. ? Cough or sore throat. ? Loss of appetite. Summary  Psoriatic arthritis is an autoimmune disease that causes joint pain, swelling, and stiffness.  Most people with psoriatic arthritis have the skin disease called psoriasis first.  You may have imaging tests and blood tests to help your health care provider diagnose this condition.  The goal of treatment is to reduce pain and inflammation and protect joints from damage.  Medicines can relieve symptoms and prevent joint damage. This information is not intended to replace advice given to you by your health care provider. Make sure you discuss any questions you have with your health care provider. Document Revised: 12/07/2018 Document Reviewed: 04/15/2018 Elsevier Patient Education  2020 [employer].

## 2020-01-24 NOTE — Progress Notes (Signed)
I, Wendy Poet, LAT, ATC, am serving as scribe for Dr. Lynne Leader.  Alan Brooks is a 44 y.o. male who presents to [place] at [facility] today for f/u of L lateral hip pain.  He was last seen by Dr. Georgina Snell on 12/12/19 and was referred to outpatient PT of which he has completed 4 visits.  He was shown a HEP by Dr. Georgina Snell at his last visit.  He had a prior L GT injection in late March 2021 w/ only a few days of improvement.  Since his last visit, pt reports that he is feeling better and rates his improvement at 60%.  He notes relief in his L lateral hip pain but con't to have pain in his IT area.  He has 2 more PT sessions scheduled.  Patient also notes that he has a rash in his scalp and his chest.  This was originally thought to be seborrheic dermatitis by his PCP.  He has had some different treatments for it but has not worked well.  He notes the rash on his chest has worsened recently.  Diagnostic imaging: Pelvis XR- 12/12/19  Pertinent review of systems: No fevers or chills  Relevant historical information: Family history psoriatic arthritis   Exam:  BP 102/78 (BP Location: Right Arm, Patient Position: Sitting, Cuff Size: Large)   Pulse 83   Ht 6' 2.5" (1.892 m)   Wt 194 lb (88 kg)   SpO2 96%   BMI 24.57 kg/m  General: Well Developed, well nourished, and in no acute distress.   MSK: C-spine: Normal-appearing nontender decreased cervical motion. L-spine normal-appearing nontender decreased lumbar motion. Hip normal motion tender ischial tuberosity bilaterally Feet: Normal-appearing bilaterally.  Tender palpation mildly at mid arch plantar. Skin: Raised plaque erythematous changes on chest trunk and on scalp.  No significant several scale present.       Lab and Radiology Results DG Pelvis 1-2 Views  Result Date: 12/12/2019 CLINICAL DATA:  Left hip pain EXAM: PELVIS - 1-2 VIEW COMPARISON:  None. FINDINGS: Early spurring in the hip joints bilaterally.  SI joints symmetric and unremarkable. No acute bony abnormality. Specifically, no fracture, subluxation, or dislocation. IMPRESSION: Early spurring within the hip joints bilaterally. No acute bony abnormality. Electronically Signed   By: Rolm Baptise M.D.   On: 12/12/2019 21:58   I, Lynne Leader, personally (independently) visualized and performed the interpretation of the images attached in this note.  Component     Latest Ref Rng & Units 06/28/2018  RA Latex Turbid.     <14 IU/mL <14  CRP     <8.0 mg/L 2.1  Anti Nuclear Antibody (ANA)     NEGATIVE NEGATIVE  Sed Rate     0 - 15 mm/h 2  Uric Acid, Serum     4.0 - 8.0 mg/dL 6.1      Assessment and Plan: 44 y.o. male with ischial tuberosity.  Plan to extend physical therapy and recheck in the future in about a month.  Could consider injection at that point.  Trochanteric bursitis is improved to proceed with home exercise program.  Patient also has cervical and lumbar back pain.  Certainly could be isolated musculoskeletal issues however given his psoriasis as below I am concerned for psoriatic arthritis.  We will add physical therapy but also proceed with rheumatologic work-up listed below and recheck in a month.  May benefit from referral to rheumatology.  Foot pain: Plantar fasciitis likely.  Plan for home exercise  program as taught by me in clinic today and ice massage.  Psoriasis: Skin appearance today is more consistent with psoriasis as a scalp.  Seborrheic dermatitis less likely.  He does not have a follow-up appointment scheduled with PCP anytime soon so we will go ahead and start initial treatment for psoriasis with high potency steroid topical ointment for his trunk and solution for his scalp.  Follow-up with PCP for this issue.  Possible psoriatic arthritis: Given his multiple different locations of arthralgia and spine stiffness and pain I am concerned for psoriatic arthritis or ankylosing spondylitis.  Plan for rheumatologic  work-up below.   PDMP not reviewed this encounter. Orders Placed This Encounter  Procedures  . Sedimentation rate    Standing Status:   Future    Number of Occurrences:   1    Standing Expiration Date:   01/23/2021  . ANA    Standing Status:   Future    Number of Occurrences:   1    Standing Expiration Date:   01/23/2021  . CK    Standing Status:   Future    Number of Occurrences:   1    Standing Expiration Date:   01/23/2021  . Rheumatoid factor    Standing Status:   Future    Number of Occurrences:   1    Standing Expiration Date:   01/23/2021  . HLA-B27 antigen    Standing Status:   Future    Number of Occurrences:   1    Standing Expiration Date:   01/23/2021  . Complement, total    Standing Status:   Future    Number of Occurrences:   1    Standing Expiration Date:   01/23/2021  . Ambulatory referral to Physical Therapy    Referral Priority:   Routine    Referral Type:   Physical Medicine    Referral Reason:   Specialty Services Required    Requested Specialty:   Physical Therapy   Meds ordered this encounter  Medications  . clobetasol ointment (TEMOVATE) 0.05 %    Sig: Apply 1 application topically 2 (two) times daily. To rash on body    Dispense:  60 g    Refill:  4  . fluocinonide (LIDEX) 0.05 % external solution    Sig: Apply 1 application topically 2 (two) times daily. To scalp    Dispense:  60 mL    Refill:  4     Discussed warning signs or symptoms. Please see discharge instructions. Patient expresses understanding.   The above documentation has been reviewed and is accurate and complete Lynne Leader, M.D.

## 2020-01-25 ENCOUNTER — Encounter: Payer: Self-pay | Admitting: Physical Therapy

## 2020-01-25 ENCOUNTER — Ambulatory Visit: Payer: BC Managed Care – PPO | Attending: Family Medicine | Admitting: Physical Therapy

## 2020-01-25 DIAGNOSIS — M25552 Pain in left hip: Secondary | ICD-10-CM | POA: Diagnosis not present

## 2020-01-25 DIAGNOSIS — M62830 Muscle spasm of back: Secondary | ICD-10-CM

## 2020-01-25 DIAGNOSIS — R252 Cramp and spasm: Secondary | ICD-10-CM | POA: Diagnosis not present

## 2020-01-25 DIAGNOSIS — M546 Pain in thoracic spine: Secondary | ICD-10-CM | POA: Insufficient documentation

## 2020-01-25 DIAGNOSIS — M79671 Pain in right foot: Secondary | ICD-10-CM | POA: Diagnosis not present

## 2020-01-25 NOTE — Patient Instructions (Signed)
Access Code: [email or web address]: [email or web address]: 05/26/2021Prepared by: [facility]  Supine Hamstring Stretch with Doorway - 1 x daily - 7 x weekly - 2 sets - 5 reps - 5 seconds hold  Bridge with Heels on [employer] - 1 x daily - 7 x weekly - 2-3 sets - 10 reps  Arch Lifting - 1 x daily - 7 x weekly - 10 reps - 5 seconds hold  Seated Self Great Toe Stretch - 1 x daily - 7 x weekly - 5-10 reps - 10 sec hold  Supine Hip Internal and External Rotation - 2 x daily - 7 x weekly - 15 reps  Bridge - 1 x daily - 7 x weekly - 10 reps - 3 sets  [facility] [address] [place], [place] [address] Phone # [phone] Fax [phone]

## 2020-01-25 NOTE — Therapy (Signed)
[facility] [address], [address] [place], [place], [address] Phone: [phone]   Fax:  [phone]  Physical Therapy Evaluation  Patient Details  Name: Alan Brooks MRN: [id number] Date of Birth: [birth year] Referring Provider (PT): Clementeen Graham, MD   Encounter Date: 01/25/2020  PT End of Session - 01/25/20 1116    Visit Number  5    Date for PT Re-Evaluation  03/30/20    Authorization Type  BCBS    Authorization - Visit Number  5    Authorization - Number of Visits  30    PT Start Time  1016    PT Stop Time  1059    PT Time Calculation (min)  43 min    Activity Tolerance  Patient tolerated treatment well;Patient limited by pain    Behavior During Therapy  [facility] for tasks assessed/performed       Past Medical History:  Diagnosis Date  . Allergy   . Anxiety   . Asthma     History reviewed. No pertinent surgical history.  There were no vitals filed for this visit.   Subjective Assessment - 01/25/20 1024    Subjective  Pt states that his upper back and neck and Rt foot are also bothering him. His foot and hips are the biggest issues. This began over a year ago. He was having pain in the great toe, then it has migrated to the heel and medial aspect of the foot.    Currently in Pain?  Yes   medial Rt foot 3/10 or 4/10        [facility] PT Assessment - 01/25/20 0001      Assessment   Medical Diagnosis  trochanteric bursitis of the Lt hip, ischial bursitis; Rt medial foot     Referring Provider (PT)  Clementeen Graham, MD    Onset Date/Surgical Date  04/22/19    Prior Therapy  none      Precautions   Precautions  None      Restrictions   Weight Bearing Restrictions  No      Balance Screen   Has the patient fallen in the past 6 months  No    Has the patient had a decrease in activity level because of a fear of falling?   No    Is the patient reluctant to leave their home because of a fear of falling?   No      Home Public house manager residence    Living Arrangements  Spouse/significant other    Type of Home  House      Prior Function   Level of Independence  Independent    Vocation  Unemployed    Leisure  karate, boxing- not able to do due to pain      Cognition   Overall Cognitive Status  Within Functional Limits for tasks assessed      Observation/Other Assessments   Focus on Therapeutic Outcomes (FOTO)   63% limitation      Posture/Postural Control   Posture/Postural Control  Postural limitations    Postural Limitations  Flexed trunk      AROM   Overall AROM   Within functional limits for tasks performed    Overall AROM Comments  Rt great toe extension 45 deg, Lt 35 deg       PROM   Overall PROM   Deficits    Overall PROM Comments  hip flexibility limited by 25% bilaterally  Strength   Overall Strength  Within functional limits for tasks performed    Overall Strength Comments  Lt hip abduction (+) pain 3/5 MMT; hip extension with knee bent (+) pain on Lt       Palpation   Palpation comment  localized tenderness over Lt trochanteric and ischial bursas and associated soft tissue (gluteals, hamstrings, ITB)      Transfers   Transfers  Independent with all Transfers      Ambulation/Gait   Ambulation/Gait  Yes    Ambulation/Gait Assistance  7: Independent    Gait Pattern  Antalgic   due to bil foot pain   Stairs  Yes    Stairs Assistance  7: Independent      High Level Balance   High Level Balance Comments  SLS: difficult on Rt 10 sec, Lt 10+ sec                  Objective measurements completed on examination: See above findings.      [facility] Adult PT Treatment/Exercise - 01/25/20 0001      Ambulation/Gait   Gait Comments  Pt cued to avoid excessive supination and walking on the outside of his foot       Self-Care   Self-Care  Other Self-Care Comments    Other Self-Care Comments   use of shoe inserts throughout the day       Knee/Hip Exercises: Seated   Other Seated Knee/Hip Exercises  long sitting hip IR/ER knee fall out x15 reps     Other Seated Knee/Hip Exercises  great toe stretch HEP demo; arch squeeze x10 reps PT cues to avoid foot inversion             PT Education - 01/25/20 1331    Education Details  gait mechanics; self care    Person(s) Educated  Patient    Methods  Explanation;Handout;Verbal cues    Comprehension  Verbalized understanding       PT Short Term Goals - 01/25/20 1332      PT SHORT TERM GOAL #1   Title  be independent in initial HEP    Baseline  --    Time  4    Period  Weeks    Status  Achieved    Target Date  01/18/20      PT SHORT TERM GOAL #2   Title  report a 25% reduction in Lt hip and LE pain with standing and walking    Time  4    Period  Weeks    Status  New    Target Date  01/18/20      PT SHORT TERM GOAL #3   Title  sleep with 25% fewer sleep interruptions due to Lt LE pain    Time  4    Status  New    Target Date  01/18/20        PT Long Term Goals - 01/25/20 1113      PT LONG TERM GOAL #1   Title  be independent in advanced HEP    Time  8    Period  Weeks    Status  On-going      PT LONG TERM GOAL #2   Title  report waking < or = to 1x/night with pain    Time  8    Period  Weeks    Status  On-going      PT LONG TERM GOAL #3   Title  reduce FOTO to < or = to 40% limitation    Time  8    Period  Weeks    Status  On-going      PT LONG TERM GOAL #4   Title  report a 60% reduction in Lt LE pain with sitting, standing and walking    Time  8    Period  Weeks    Status  New      PT LONG TERM GOAL #5   Title  Pt will have greater than 55 deg of Lt and Rt great toe extension which will improve his mechanics with walking.    Time  8    Period  Weeks             Plan - 01/25/20 1120    Clinical Impression Statement  Pt has new referral from MD to address cervical, lumbar and foot pain. Due to current focus on the hip and  hamstring pain, pt feels that his foot pain is most limiting, so PT spend today evaluating this. Pt has pain for over a year in the Rt foot towards the heel and recent onset medial ankle pain. Pt demonstrates altered gait mechanics and excessive foot inversion/supination, in addition to pain in the arch with weight bearing. He has significant limitations in great toe flexibility bilaterally and hip weakness on the Lt consistent with original referral diagnosis. Pts Rt foot pain and mobility/strength deficits likely have an impact on the recovery of his Lt hamstring and greater trochanter, so he would greatly benefit from skilled PT to address this moving forward.    Rehab Potential  Good    PT Frequency  Other (comment)   1-2x/week   PT Duration  8 weeks    PT Treatment/Interventions  ADLs/Self Care Home Management;Cryotherapy;Ultrasound;Moist Heat;Iontophoresis 4mg /ml Dexamethasone;Electrical Stimulation;Gait training;Neuromuscular re-education;Therapeutic exercise;Therapeutic activities;Patient/family education;Passive range of motion;Taping;Vasopneumatic Device;Dry needling    PT Next Visit Plan  f/u on HEP, hip flexor stretch, gait training to avoid antalgic pattern, ice/rolling plantar fascia for improved mechanics, standing deadlift    PT Home Exercise Plan  [email or web address]    Consulted and Agree with Plan of Care  Patient       Patient will benefit from skilled therapeutic intervention in order to improve the following deficits and impairments:  Abnormal gait, Decreased activity tolerance, Impaired flexibility, Decreased range of motion, Decreased endurance, Difficulty walking, Increased muscle spasms  Visit Diagnosis: Cramp and spasm  Pain in thoracic spine  Pain in left hip  Muscle spasm of back  Pain in right foot     Problem List Patient Active Problem List   Diagnosis Date Noted  . Psoriasis 01/24/2020  . Thoracic disc herniation 06/11/2018  . Genital warts 10/10/2017  .  Rosacea, acne 09/27/2014  . Low back pain 09/27/2014  . Carpal tunnel syndrome of left wrist 01/08/2012  . Anxiety state 05/19/2008  . Asthma 05/19/2008    1:33 PM,01/25/20 03/26/20 PT, DPT [facility]  [phone]  [facility] [address] [place], [place], [place] Phone: [phone]   Fax:  [phone]  Name: Alan Brooks MRN: Beatris Ship Date of Birth: [birth year]

## 2020-01-27 LAB — COMPLEMENT, TOTAL: Compl, Total (CH50): >60 U/mL — ABNORMAL HIGH (ref 31–60)

## 2020-01-27 LAB — ANTI-NUCLEAR AB-TITER (ANA TITER): ANA Titer 1: 1:40 {titer} — ABNORMAL HIGH

## 2020-01-27 LAB — HLA-B27 ANTIGEN: HLA-B27 Antigen: NEGATIVE

## 2020-01-27 LAB — ANA: Anti Nuclear Antibody (ANA): POSITIVE — AB

## 2020-01-27 LAB — RHEUMATOID FACTOR: Rheumatoid fact SerPl-aCnc: <14 IU/mL (ref <14)

## 2020-01-27 NOTE — Addendum Note (Signed)
Addended by: Rodolph Bong on: 01/27/2020 07:39 AM   Modules accepted: Orders

## 2020-01-27 NOTE — Progress Notes (Signed)
Labs currently are normal. Other labs are pending

## 2020-02-01 ENCOUNTER — Other Ambulatory Visit: Payer: Self-pay

## 2020-02-01 ENCOUNTER — Ambulatory Visit: Payer: BC Managed Care – PPO

## 2020-02-01 DIAGNOSIS — M79671 Pain in right foot: Secondary | ICD-10-CM

## 2020-02-01 DIAGNOSIS — M546 Pain in thoracic spine: Secondary | ICD-10-CM

## 2020-02-01 DIAGNOSIS — M25552 Pain in left hip: Secondary | ICD-10-CM

## 2020-02-01 DIAGNOSIS — R252 Cramp and spasm: Secondary | ICD-10-CM | POA: Diagnosis not present

## 2020-02-01 DIAGNOSIS — M62830 Muscle spasm of back: Secondary | ICD-10-CM

## 2020-02-01 NOTE — Therapy (Signed)
[facility] [address] [place], [place], [address] Phone: [phone]   Fax:  [phone]  Physical Therapy Treatment  Patient Details  Name: Alan Brooks MRN: [id number] Date of Birth: 05/15/1976 Referring Provider (PT): Clementeen Graham, MD   Encounter Date: 02/01/2020  PT End of Session - 02/01/20 1058    Visit Number  6    Date for PT Re-Evaluation  03/30/20    Authorization Type  BCBS    Authorization - Visit Number  6    Authorization - Number of Visits  30    PT Start Time  1012    PT Stop Time  1057    PT Time Calculation (min)  45 min    Activity Tolerance  Patient tolerated treatment well;Patient limited by pain    Behavior During Therapy  [facility] for tasks assessed/performed       Past Medical History:  Diagnosis Date  . Allergy   . Anxiety   . Asthma     History reviewed. No pertinent surgical history.  There were no vitals filed for this visit.  Subjective Assessment - 02/01/20 1008    Subjective  I am sore today.  I have been doing my exercises.  Yesterday, my knees both started bothering me.    Currently in Pain?  Yes    Pain Score  2     Pain Location  Buttocks    Pain Orientation  Left    Pain Descriptors / Indicators  Sharp;Shooting    Pain Type  Chronic pain    Pain Onset  More than a month ago    Pain Frequency  Intermittent    Aggravating Factors   sitting, standing, walking, sleep    Pain Relieving Factors  rest, ibuprofen    Multiple Pain Sites  Yes    Pain Score  3    Pain Location  Foot    Pain Orientation  Right    Pain Descriptors / Indicators  Sore    Pain Type  Chronic pain    Pain Onset  More than a month ago    Pain Frequency  Intermittent    Aggravating Factors   standing, walking    Pain Relieving Factors  non-weightbearing                        [facility] Adult PT Treatment/Exercise - 02/01/20 0001      Exercises   Exercises  Ankle      Lumbar  Exercises: Stretches   Active Hamstring Stretch  Right;Left;2 reps;30 seconds    Active Hamstring Stretch Limitations  supine with strap    Piriformis Stretch  Left;Right;2 reps;20 seconds    Piriformis Stretch Limitations  supine with overpressure into figure 4      Lumbar Exercises: Aerobic   Nustep  Level 2, legs only x6 minutes       Lumbar Exercises: Supine   Bridge  20 reps;5 seconds      Knee/Hip Exercises: Standing   Rocker Board  3 minutes    Other Standing Knee Exercises  dead lift 5# 2x10- verbal cues for alignment and to reduce thoracic flexion      Manual Therapy   Manual Therapy  Joint mobilization;Soft tissue mobilization    Manual therapy comments  mobs to great toe, metatarsal mobs and passive stretch to all toes at MTP      Ankle Exercises: Stretches   Plantar Fascia  Stretch Limitations  great toe extension x5    Other Stretch  marble pick up- 1 cup    Other Stretch  standing arch lift- max tactile and verbal cues with this today               PT Short Term Goals - 01/25/20 1332      PT SHORT TERM GOAL #1   Title  be independent in initial HEP    Baseline  --    Time  4    Period  Weeks    Status  Achieved    Target Date  01/18/20      PT SHORT TERM GOAL #2   Title  report a 25% reduction in Lt hip and LE pain with standing and walking    Time  4    Period  Weeks    Status  New    Target Date  01/18/20      PT SHORT TERM GOAL #3   Title  sleep with 25% fewer sleep interruptions due to Lt LE pain    Time  4    Status  New    Target Date  01/18/20        PT Long Term Goals - 01/25/20 1113      PT LONG TERM GOAL #1   Title  be independent in advanced HEP    Time  8    Period  Weeks    Status  On-going      PT LONG TERM GOAL #2   Title  report waking < or = to 1x/night with pain    Time  8    Period  Weeks    Status  On-going      PT LONG TERM GOAL #3   Title  reduce FOTO to < or = to 40% limitation    Time  8    Period   Weeks    Status  On-going      PT LONG TERM GOAL #4   Title  report a 60% reduction in Lt LE pain with sitting, standing and walking    Time  8    Period  Weeks    Status  New      PT LONG TERM GOAL #5   Title  Pt will have greater than 55 deg of Lt and Rt great toe extension which will improve his mechanics with walking.    Time  8    Period  Weeks            Plan - 02/01/20 1036    Clinical Impression Statement  Pt has been consistent with HEP issued last session for Rt foot pain.  Pt with increased bil knee pain over the past 2 days with unknown cause.  Pt reports that "original" Lt gluteal pain is better but unable to determine the improvement as he is sore from new exercises.  Pt with increased gluteal pain with bridging with the ball so this was eliminated.  Pt required frequent verbal cues to reduce the intensity of hamstring stretch and only stretch to tolerance. Pt with hypomobility in the Rt great toe and was challenged with arch lifting.  Tactile and demo cues were provided and pt demonstrated improved technique after training today.  Pt will continue to benefit from skilled PT to address Rt foot pain and Lt gluteal pain.    Rehab Potential  Good    PT Frequency  Other (comment)  PT Duration  8 weeks    PT Treatment/Interventions  ADLs/Self Care Home Management;Cryotherapy;Ultrasound;Moist Heat;Iontophoresis 4mg /ml Dexamethasone;Electrical Stimulation;Gait training;Neuromuscular re-education;Therapeutic exercise;Therapeutic activities;Patient/family education;Passive range of motion;Taping;Vasopneumatic Device;Dry needling    PT Next Visit Plan  Rt foot intrinsic exercises, plantar mobility, standing deadlift    PT Home Exercise Plan  [email or web address]    Consulted and Agree with Plan of Care  Patient       Patient will benefit from skilled therapeutic intervention in order to improve the following deficits and impairments:  Abnormal gait, Decreased activity tolerance, Impaired  flexibility, Decreased range of motion, Decreased endurance, Difficulty walking, Increased muscle spasms  Visit Diagnosis: Cramp and spasm  Pain in thoracic spine  Pain in left hip  Muscle spasm of back  Pain in right foot     Problem List Patient Active Problem List   Diagnosis Date Noted  . Psoriasis 01/24/2020  . Thoracic disc herniation 06/11/2018  . Genital warts 10/10/2017  . Rosacea, acne 09/27/2014  . Low back pain 09/27/2014  . Carpal tunnel syndrome of left wrist 01/08/2012  . Anxiety state 05/19/2008  . Asthma 05/19/2008     05/21/2008, PT 02/01/20 11:02 AM  [facility] [address], [address] [place], [place], [place] Phone: [phone]   Fax:  [phone]  Name: STEPHAUN MILLION MRN: Beatris Ship Date of Birth: February 03, 1976

## 2020-02-06 ENCOUNTER — Ambulatory Visit: Payer: BC Managed Care – PPO

## 2020-02-06 ENCOUNTER — Other Ambulatory Visit: Payer: Self-pay

## 2020-02-06 DIAGNOSIS — M546 Pain in thoracic spine: Secondary | ICD-10-CM | POA: Diagnosis not present

## 2020-02-06 DIAGNOSIS — M62830 Muscle spasm of back: Secondary | ICD-10-CM

## 2020-02-06 DIAGNOSIS — R252 Cramp and spasm: Secondary | ICD-10-CM | POA: Diagnosis not present

## 2020-02-06 DIAGNOSIS — M79671 Pain in right foot: Secondary | ICD-10-CM

## 2020-02-06 DIAGNOSIS — M25552 Pain in left hip: Secondary | ICD-10-CM

## 2020-02-06 NOTE — Therapy (Signed)
[facility] [address], [address] [place], [place], [address] Phone: [phone]   Fax:  [phone]  Physical Therapy Treatment  Patient Details  Name: Alan Brooks MRN: [id number] Date of Birth: [birth year] Referring Provider (PT): Clementeen Graham, MD   Encounter Date: 02/06/2020   PT End of Session - 02/06/20 1102    Visit Number 7    Date for PT Re-Evaluation 03/30/20    Authorization Type BCBS    Authorization - Visit Number 7    Authorization - Number of Visits 30    PT Start Time 1016    PT Stop Time 1100    PT Time Calculation (min) 44 min    Activity Tolerance Patient tolerated treatment well;Patient limited by pain    Behavior During Therapy [facility] for tasks assessed/performed           Past Medical History:  Diagnosis Date  . Allergy   . Anxiety   . Asthma     History reviewed. No pertinent surgical history.  There were no vitals filed for this visit.   Subjective Assessment - 02/06/20 1020    Subjective I am in pain today- my feet and my back.  I was active this weekend- light karate, walking in a museum, 1 mile walk.  I was able to walk 1 mile with little to no pain.    Currently in Pain? Yes    Pain Score 4     Pain Location Buttocks    Pain Orientation Left    Pain Descriptors / Indicators Sharp;Shooting    Pain Type Chronic pain    Pain Onset More than a month ago    Pain Frequency Intermittent    Aggravating Factors  sitting, standing, walking, sleep    Pain Relieving Factors rest, ibuprofen    Pain Score 6    Pain Location Foot    Pain Orientation Right;Left    Pain Descriptors / Indicators Sore    Pain Type Chronic pain    Pain Onset More than a month ago    Pain Frequency Intermittent    Aggravating Factors  standing and walking    Pain Relieving Factors non-weightbearing                             [facility] Adult PT Treatment/Exercise - 02/06/20 0001      Lumbar  Exercises: Stretches   Active Hamstring Stretch Right;Left;2 reps;30 seconds    Active Hamstring Stretch Limitations supine with strap    ITB Stretch Left;Right;2 reps;20 seconds    Piriformis Stretch Left;Right;2 reps;20 seconds    Piriformis Stretch Limitations supine with overpressure into figure 4      Lumbar Exercises: Aerobic   Nustep Level 2, legs only x6 minutes    PT present to discuss progress     Lumbar Exercises: Supine   Bridge 20 reps;5 seconds      Knee/Hip Exercises: Standing   Rocker Board 3 minutes      Knee/Hip Exercises: Supine   Bridges with Ball Squeeze Strengthening;Both;2 sets;10 reps      Knee/Hip Exercises: Sidelying   Clams 2x10 with abdominal bracing      Manual Therapy   Manual Therapy Joint mobilization;Soft tissue mobilization    Manual therapy comments mobs to great toe, metatarsal mobs and passive stretch to all toes at MTP      Ankle Exercises: Stretches   Plantar Fascia Stretch Limitations  great toe extension x5    Other Stretch marble pick up- 1 cup (both feet                    PT Short Term Goals - 01/25/20 1332      PT SHORT TERM GOAL #1   Title be independent in initial HEP    Baseline --    Time 4    Period Weeks    Status Achieved    Target Date 01/18/20      PT SHORT TERM GOAL #2   Title report a 25% reduction in Lt hip and LE pain with standing and walking    Time 4    Period Weeks    Status New    Target Date 01/18/20      PT SHORT TERM GOAL #3   Title sleep with 25% fewer sleep interruptions due to Lt LE pain    Time 4    Status New    Target Date 01/18/20             PT Long Term Goals - 01/25/20 1113      PT LONG TERM GOAL #1   Title be independent in advanced HEP    Time 8    Period Weeks    Status On-going      PT LONG TERM GOAL #2   Title report waking < or = to 1x/night with pain    Time 8    Period Weeks    Status On-going      PT LONG TERM GOAL #3   Title reduce FOTO to < or = to  40% limitation    Time 8    Period Weeks    Status On-going      PT LONG TERM GOAL #4   Title report a 60% reduction in Lt LE pain with sitting, standing and walking    Time 8    Period Weeks    Status New      PT LONG TERM GOAL #5   Title Pt will have greater than 55 deg of Lt and Rt great toe extension which will improve his mechanics with walking.    Time 8    Period Weeks                 Plan - 02/06/20 1033    Clinical Impression Statement Pt has been more active over the past few days so increased pain in bil feet and low back.  Pt with increased Lt knee pain x 1 week. Pt with hypomobility in the Rt great toe with mild discomfort at end range with P/ROM.   Pt tolerated marble pick-up bilaterally with good intrinsic strength and endurance.   Pt will continue to benefit from skilled PT to address Rt foot pain and Lt gluteal pain.    Stability/Clinical Decision Making Stable/Uncomplicated    Rehab Potential Good    PT Frequency Other (comment)    PT Duration 8 weeks    PT Treatment/Interventions ADLs/Self Care Home Management;Cryotherapy;Ultrasound;Moist Heat;Iontophoresis 4mg /ml Dexamethasone;Electrical Stimulation;Gait training;Neuromuscular re-education;Therapeutic exercise;Therapeutic activities;Patient/family education;Passive range of motion;Taping;Vasopneumatic Device;Dry needling    PT Next Visit Plan Rt foot intrinsic exercises, plantar mobility, core and hip strength    PT Home Exercise Plan [id number]    Recommended Other Services recert is signed    Consulted and Agree with Plan of Care Patient           Patient will benefit from skilled therapeutic  intervention in order to improve the following deficits and impairments:  Abnormal gait, Decreased activity tolerance, Impaired flexibility, Decreased range of motion, Decreased endurance, Difficulty walking, Increased muscle spasms  Visit Diagnosis: Cramp and spasm  Pain in thoracic spine  Pain in left  hip  Muscle spasm of back  Pain in right foot     Problem List Patient Active Problem List   Diagnosis Date Noted  . Psoriasis 01/24/2020  . Thoracic disc herniation 06/11/2018  . Genital warts 10/10/2017  . Rosacea, acne 09/27/2014  . Low back pain 09/27/2014  . Carpal tunnel syndrome of left wrist 01/08/2012  . Anxiety state 05/19/2008  . Asthma 05/19/2008     Alan Brooks, PT 02/06/20 11:04 AM  [facility] [address], [address] [place], [place], [address] Phone: [phone]   Fax:  [phone]  Name: Alan Brooks MRN: [id number] Date of Birth: May 23, 1976

## 2020-02-08 ENCOUNTER — Other Ambulatory Visit: Payer: Self-pay | Admitting: Adult Health

## 2020-02-08 MED FILL — PANTOPRAZOLE SOD DR 40 MG T: 40 | 90 days supply | Qty: 90 | Fill #0

## 2020-02-09 MED FILL — CITALOPRAM HBR 40 MG TABLET: 40 | 90 days supply | Qty: 90 | Fill #0

## 2020-02-09 NOTE — Telephone Encounter (Signed)
Sent to the pharmacy by e-scribe. 

## 2020-02-14 ENCOUNTER — Ambulatory Visit: Payer: BC Managed Care – PPO

## 2020-02-14 ENCOUNTER — Other Ambulatory Visit: Payer: Self-pay

## 2020-02-14 DIAGNOSIS — M546 Pain in thoracic spine: Secondary | ICD-10-CM

## 2020-02-14 DIAGNOSIS — M62830 Muscle spasm of back: Secondary | ICD-10-CM

## 2020-02-14 DIAGNOSIS — M25552 Pain in left hip: Secondary | ICD-10-CM

## 2020-02-14 DIAGNOSIS — R252 Cramp and spasm: Secondary | ICD-10-CM | POA: Diagnosis not present

## 2020-02-14 DIAGNOSIS — M79671 Pain in right foot: Secondary | ICD-10-CM

## 2020-02-14 NOTE — Therapy (Signed)
[facility] [address] [place], [place], [address] Phone: [phone]   Fax:  [phone]  Physical Therapy Treatment  Patient Details  Name: Alan Brooks MRN: [id number] Date of Birth: [birth year] Referring Provider (PT): Lynne Leader, MD   Encounter Date: 02/14/2020   PT End of Session - 02/14/20 1310    Visit Number 8    Date for PT Re-Evaluation 03/30/20    Authorization Type BCBS    Authorization - Visit Number 8    Authorization - Number of Visits 30    PT Start Time 5631    PT Stop Time 1314    PT Time Calculation (min) 43 min    Activity Tolerance Patient tolerated treatment well    Behavior During Therapy [facility] for tasks assessed/performed           Past Medical History:  Diagnosis Date  . Allergy   . Anxiety   . Asthma     History reviewed. No pertinent surgical history.  There were no vitals filed for this visit.   Subjective Assessment - 02/14/20 1234    Subjective Things are feeling pretty good.  My feet hurt and now my Lt ITB.    Currently in Pain? Yes    Pain Location Buttocks    Pain Orientation Left    Pain Descriptors / Indicators Shooting;Sharp    Pain Type Chronic pain    Pain Onset More than a month ago    Pain Frequency Intermittent    Aggravating Factors  sitting, standing, sleep    Pain Relieving Factors rest, ibuprofen    Pain Score 5    Pain Location Foot    Pain Orientation Right;Left    Pain Descriptors / Indicators Sore    Pain Type Chronic pain    Pain Onset More than a month ago    Pain Frequency Intermittent    Aggravating Factors  morning hours, standing/walkking    Pain Relieving Factors rest, ice, ibuprofen                             [facility] Adult PT Treatment/Exercise - 02/14/20 0001      Lumbar Exercises: Stretches   Active Hamstring Stretch Right;Left;2 reps;30 seconds    Active Hamstring Stretch Limitations supine with strap     ITB Stretch Left;Right;2 reps;20 seconds      Lumbar Exercises: Aerobic   Nustep Level 2, legs only x 8 minutes    PT present to discuss progress     Lumbar Exercises: Supine   Bridge --      Knee/Hip Exercises: Standing   Rocker Board 3 minutes      Knee/Hip Exercises: Supine   Bridges with Ball Squeeze --      Knee/Hip Exercises: Sidelying   Clams 2x10 with abdominal bracing      Manual Therapy   Manual Therapy Joint mobilization;Soft tissue mobilization    Manual therapy comments mobs to great toe, metatarsal mobs and passive stretch to all toes at MTP      Ankle Exercises: Stretches   Plantar Fascia Stretch Limitations great toe extension x5    Other Stretch marble pick up- 1 cup (both feet                    PT Short Term Goals - 01/25/20 1332      PT SHORT TERM GOAL #1   Title be  independent in initial HEP    Baseline --    Time 4    Period Weeks    Status Achieved    Target Date 01/18/20      PT SHORT TERM GOAL #2   Title report a 25% reduction in Lt hip and LE pain with standing and walking    Time 4    Period Weeks    Status New    Target Date 01/18/20      PT SHORT TERM GOAL #3   Title sleep with 25% fewer sleep interruptions due to Lt LE pain    Time 4    Status New    Target Date 01/18/20             PT Long Term Goals - 01/25/20 1113      PT LONG TERM GOAL #1   Title be independent in advanced HEP    Time 8    Period Weeks    Status On-going      PT LONG TERM GOAL #2   Title report waking < or = to 1x/night with pain    Time 8    Period Weeks    Status On-going      PT LONG TERM GOAL #3   Title reduce FOTO to < or = to 40% limitation    Time 8    Period Weeks    Status On-going      PT LONG TERM GOAL #4   Title report a 60% reduction in Lt LE pain with sitting, standing and walking    Time 8    Period Weeks    Status New      PT LONG TERM GOAL #5   Title Pt will have greater than 55 deg of Lt and Rt great toe  extension which will improve his mechanics with walking.    Time 8    Period Weeks                 Plan - 02/14/20 1305    Clinical Impression Statement Pt has been more active over the past few days so increased pain in bil feet and low back.  Pt is sleeping in a foot splint to promote DF.  Pt with hypomobility in the Rt great toe with discomfort with flexion initially. After manual therapy, this pain was improved.   Pt tolerated marble pick-up bilaterally with good intrinsic strength and endurance.  Session focused on strength and flexibility to address hip and foot pain.  Pt will continue to benefit from skilled PT to address Rt foot pain and Lt gluteal pain.    PT Frequency Other (comment)    PT Duration 8 weeks    PT Treatment/Interventions ADLs/Self Care Home Management;Cryotherapy;Ultrasound;Moist Heat;Iontophoresis 4mg /ml Dexamethasone;Electrical Stimulation;Gait training;Neuromuscular re-education;Therapeutic exercise;Therapeutic activities;Patient/family education;Passive range of motion;Taping;Vasopneumatic Device;Dry needling    PT Next Visit Plan Rt foot intrinsic exercises, plantar mobility, core and hip strength    PT Home Exercise Plan [id number]           Patient will benefit from skilled therapeutic intervention in order to improve the following deficits and impairments:  Abnormal gait, Decreased activity tolerance, Impaired flexibility, Decreased range of motion, Decreased endurance, Difficulty walking, Increased muscle spasms  Visit Diagnosis: Cramp and spasm  Pain in thoracic spine  Pain in left hip  Muscle spasm of back  Pain in right foot     Problem List Patient Active Problem List   Diagnosis Date Noted  .  Psoriasis 01/24/2020  . Thoracic disc herniation 06/11/2018  . Genital warts 10/10/2017  . Rosacea, acne 09/27/2014  . Low back pain 09/27/2014  . Carpal tunnel syndrome of left wrist 01/08/2012  . Anxiety state 05/19/2008  . Asthma  05/19/2008    Lorrene Reid, PT 02/14/20 1:13 PM  [facility] [address], [address] [place], [place], [address] Phone: [phone]   Fax:  [phone]  Name: Alan Brooks MRN: [id number] Date of Birth: 1976-02-28

## 2020-02-20 DIAGNOSIS — L401 Generalized pustular psoriasis: Secondary | ICD-10-CM | POA: Diagnosis not present

## 2020-02-20 DIAGNOSIS — R768 Other specified abnormal immunological findings in serum: Secondary | ICD-10-CM | POA: Diagnosis not present

## 2020-02-20 DIAGNOSIS — M2550 Pain in unspecified joint: Secondary | ICD-10-CM | POA: Diagnosis not present

## 2020-02-20 MED FILL — MELOXICAM 15 MG TABLET: 15 | 30 days supply | Qty: 30 | Fill #0

## 2020-02-22 ENCOUNTER — Other Ambulatory Visit: Payer: Self-pay

## 2020-02-22 ENCOUNTER — Ambulatory Visit: Payer: BC Managed Care – PPO

## 2020-02-22 ENCOUNTER — Ambulatory Visit: Payer: BC Managed Care – PPO | Admitting: Family Medicine

## 2020-02-22 ENCOUNTER — Encounter: Payer: Self-pay | Admitting: Family Medicine

## 2020-02-22 VITALS — BP 122/82 | HR 86 | Ht 74.5 in | Wt 195.0 lb

## 2020-02-22 DIAGNOSIS — M62838 Other muscle spasm: Secondary | ICD-10-CM | POA: Diagnosis not present

## 2020-02-22 DIAGNOSIS — M546 Pain in thoracic spine: Secondary | ICD-10-CM

## 2020-02-22 DIAGNOSIS — M7072 Other bursitis of hip, left hip: Secondary | ICD-10-CM

## 2020-02-22 DIAGNOSIS — M62830 Muscle spasm of back: Secondary | ICD-10-CM

## 2020-02-22 DIAGNOSIS — M76821 Posterior tibial tendinitis, right leg: Secondary | ICD-10-CM

## 2020-02-22 DIAGNOSIS — M25552 Pain in left hip: Secondary | ICD-10-CM

## 2020-02-22 DIAGNOSIS — L409 Psoriasis, unspecified: Secondary | ICD-10-CM

## 2020-02-22 DIAGNOSIS — M76822 Posterior tibial tendinitis, left leg: Secondary | ICD-10-CM

## 2020-02-22 DIAGNOSIS — R252 Cramp and spasm: Secondary | ICD-10-CM

## 2020-02-22 DIAGNOSIS — M79671 Pain in right foot: Secondary | ICD-10-CM | POA: Diagnosis not present

## 2020-02-22 DIAGNOSIS — M7062 Trochanteric bursitis, left hip: Secondary | ICD-10-CM

## 2020-02-22 NOTE — Therapy (Signed)
[facility] [address] [place], [place], [address] Phone: [phone]   Fax:  [phone]  Physical Therapy Treatment  Patient Details  Name: Alan Brooks MRN: [id number] Date of Birth: 1976/02/13 Referring Provider (PT): Clementeen Graham, MD   Encounter Date: 02/22/2020   PT End of Session - 02/22/20 0848    Visit Number 9    Date for PT Re-Evaluation 03/30/20    Authorization Type BCBS    Authorization - Visit Number 9    Authorization - Number of Visits 30    PT Start Time 0803    PT Stop Time 0844    PT Time Calculation (min) 41 min    Activity Tolerance Patient tolerated treatment well    Behavior During Therapy [facility] for tasks assessed/performed           Past Medical History:  Diagnosis Date  . Allergy   . Anxiety   . Asthma     History reviewed. No pertinent surgical history.  There were no vitals filed for this visit.   Subjective Assessment - 02/22/20 0804    Subjective I'm doing good.  My Lt leg is feeling better.  I saw the rheumatologist and I am not taking meloxicam now.    Currently in Pain? Yes    Pain Score 3     Pain Location Buttocks    Pain Orientation Left    Pain Descriptors / Indicators Sharp;Shooting    Pain Type Chronic pain    Pain Onset More than a month ago    Pain Frequency Intermittent    Aggravating Factors  sitting, standing, sleep    Pain Relieving Factors rest, ibuprofen    Pain Score 2   7-8/10   Pain Location Foot    Pain Orientation Left;Right    Pain Descriptors / Indicators Sore    Pain Type Chronic pain    Pain Onset More than a month ago    Pain Frequency Intermittent    Aggravating Factors  early morning, walking 30 minutes,    Pain Relieving Factors rest, ice, ibuprofen                             [facility] Adult PT Treatment/Exercise - 02/22/20 0001      Lumbar Exercises: Aerobic   Nustep Level 2, legs only x 8 minutes    PT  present to discuss progress     Modalities   Modalities Iontophoresis      Iontophoresis   Type of Iontophoresis Dexamethasone    Location Rt and Lt insertion of post tib. tendon    Dose 1.0 cc   #1   Time 6 hour wear time      Manual Therapy   Manual Therapy Joint mobilization;Soft tissue mobilization    Manual therapy comments cross friction to posterior tibialis tendon bilaterally.  Passive eversion to calcaneous       Ankle Exercises: Stretches   Plantar Fascia Stretch Limitations great toe extension x5    Slant Board Stretch 3 reps;20 seconds    Other Stretch marble pick up- 1 cup (both feet                  PT Education - 02/22/20 0841    Education Details ionto instructions    Person(s) Educated Patient    Methods Explanation;Demonstration    Comprehension Verbalized understanding;Returned demonstration  PT Short Term Goals - 01/25/20 1332      PT SHORT TERM GOAL #1   Title be independent in initial HEP    Baseline --    Time 4    Period Weeks    Status Achieved    Target Date 01/18/20      PT SHORT TERM GOAL #2   Title report a 25% reduction in Lt hip and LE pain with standing and walking    Time 4    Period Weeks    Status New    Target Date 01/18/20      PT SHORT TERM GOAL #3   Title sleep with 25% fewer sleep interruptions due to Lt LE pain    Time 4    Status New    Target Date 01/18/20             PT Long Term Goals - 01/25/20 1113      PT LONG TERM GOAL #1   Title be independent in advanced HEP    Time 8    Period Weeks    Status On-going      PT LONG TERM GOAL #2   Title report waking < or = to 1x/night with pain    Time 8    Period Weeks    Status On-going      PT LONG TERM GOAL #3   Title reduce FOTO to < or = to 40% limitation    Time 8    Period Weeks    Status On-going      PT LONG TERM GOAL #4   Title report a 60% reduction in Lt LE pain with sitting, standing and walking    Time 8    Period  Weeks    Status New      PT LONG TERM GOAL #5   Title Pt will have greater than 55 deg of Lt and Rt great toe extension which will improve his mechanics with walking.    Time 8    Period Weeks                 Plan - 02/22/20 0820    Clinical Impression Statement Pt reports 95% overall improvement in Lt gluteal/hip pain since the start of care.  Pt with main complaint of bil foot pain at insertion of posterior tib tendon.  Pt has ordered OOFOS shoes per PT recommendation to wear shoes with arch support at all times.  Session focused on treatment of foot pain with intrinsic strength, flexibility, manual and ionto #1.  Pt with significant tenderness over insertion of post tib tendon.  Pt will continue to benefit from skilled PT to address bil foot pain.    PT Frequency Other (comment)    PT Duration 8 weeks    PT Treatment/Interventions ADLs/Self Care Home Management;Cryotherapy;Ultrasound;Moist Heat;Iontophoresis 4mg /ml Dexamethasone;Electrical Stimulation;Gait training;Neuromuscular re-education;Therapeutic exercise;Therapeutic activities;Patient/family education;Passive range of motion;Taping;Vasopneumatic Device;Dry needling    PT Next Visit Plan Rt foot intrinsic exercises, plantar mobility, core and hip strength.  Ionto #2    PT Home Exercise Plan [id number]    Consulted and Agree with Plan of Care Patient           Patient will benefit from skilled therapeutic intervention in order to improve the following deficits and impairments:  Abnormal gait, Decreased activity tolerance, Impaired flexibility, Decreased range of motion, Decreased endurance, Difficulty walking, Increased muscle spasms  Visit Diagnosis: Cramp and spasm  Pain in thoracic spine  Pain in  left hip  Muscle spasm of back  Pain in right foot     Problem List Patient Active Problem List   Diagnosis Date Noted  . Psoriasis 01/24/2020  . Thoracic disc herniation 06/11/2018  . Genital warts 10/10/2017  .  Rosacea, acne 09/27/2014  . Low back pain 09/27/2014  . Carpal tunnel syndrome of left wrist 01/08/2012  . Anxiety state 05/19/2008  . Asthma 05/19/2008     Lorrene Reid, PT 02/22/20 8:51 AM  [facility] [address], [address] [place], [place], [address] Phone: [phone]   Fax:  [phone]  Name: Alan Brooks MRN: [id number] Date of Birth: [birth year]

## 2020-02-22 NOTE — Patient Instructions (Signed)

## 2020-02-22 NOTE — Progress Notes (Signed)
   Wynema Birch, am serving as a Neurosurgeon for Dr. Clementeen Graham.  Mavis Fichera Mancil is a 44 y.o. male who presents to [facility] at [facility] today for f/u of back pain and B foot pain.  He was last seen by Dr. Denyse Amass on 01/24/20 and was shown Alfredson's exercises for the pain in his feet.  He has completed 8 PT sessions for back pain.  Since his last visit, pt reports states the foot pain has moved to the inner arch of left foot. Back is still the same, did see rheumatology and was given Meloxicam which he has been on two days and thinks it may be helping.  He also notes that he is now having some pain in the posterior base of the skull area.  Initially was identified as having psoriasis at the last visit and started on high potency topical steroids which have almost completely resolved his rash.  Diagnostic testing: R and L foot XR- 06/06/19; T-spine XR- 03/30/19   Pertinent review of systems: No fevers or chills  Relevant historical information: Psoriasis   Exam:  BP 122/82 (BP Location: Left Arm, Patient Position: Sitting, Cuff Size: Normal)   Pulse 86   Ht 6' 2.5" (1.892 m)   Wt 195 lb (88.5 kg)   SpO2 97%   BMI 24.70 kg/m  General: Well Developed, well nourished, and in no acute distress.   MSK:  C-spine normal-appearing normal motion.  Tender palpation paraspinal musculature.  Nontender midline.  Pressure me strength is intact. Left lateral hip normal motion normal gait. Ischial tuberosity mildly tender palpation. Feet ankles bilaterally with pronation.  Tender palpation along course of posterior tibialis tendon. Intact strength.      Assessment and Plan: 44 y.o. male with  Pain C-spine: Myofascial strain and spasm.  At this to physical therapy is physical therapy should be very helpful for this problem.  Bilateral medial ankle pain: Posterior tibialis tendinopathy.  Already starting iontophoresis in physical therapy.  Add exercises and physical  therapy and recheck in about 6 weeks.  Trochanteric bursitis and ischial tuberosity: Improving with PT.  Continue exercises and PTH.  Recheck 6 weeks.  Consider MRI if not improved.  Psoriasis dramatically improved with topical steroids.   PDMP not reviewed this encounter. Orders Placed This Encounter  Procedures  . Ambulatory referral to Physical Therapy    Referral Priority:   Routine    Referral Type:   Physical Medicine    Referral Reason:   Specialty Services Required    Requested Specialty:   Physical Therapy   No orders of the defined types were placed in this encounter.    Discussed warning signs or symptoms. Please see discharge instructions. Patient expresses understanding.   The above documentation has been reviewed and is accurate and complete Clementeen Graham, M.D.

## 2020-02-22 NOTE — Patient Instructions (Addendum)
Thank you for coming in today. Reasonable to continue meloxicam as needed.  Continue PT.  Add PT specific for foot and neck.  Continue the topical medicine for the chest. You may need to treat until better and restart it intermittently.   Recheck in 6 weeks.  Let me know sooner if needed.

## 2020-02-29 ENCOUNTER — Ambulatory Visit: Payer: BC Managed Care – PPO | Attending: Family Medicine

## 2020-02-29 ENCOUNTER — Other Ambulatory Visit: Payer: Self-pay

## 2020-02-29 DIAGNOSIS — R252 Cramp and spasm: Secondary | ICD-10-CM

## 2020-02-29 DIAGNOSIS — M546 Pain in thoracic spine: Secondary | ICD-10-CM

## 2020-02-29 DIAGNOSIS — M542 Cervicalgia: Secondary | ICD-10-CM | POA: Diagnosis not present

## 2020-02-29 DIAGNOSIS — M79671 Pain in right foot: Secondary | ICD-10-CM | POA: Diagnosis not present

## 2020-02-29 DIAGNOSIS — M25552 Pain in left hip: Secondary | ICD-10-CM | POA: Insufficient documentation

## 2020-02-29 DIAGNOSIS — M62830 Muscle spasm of back: Secondary | ICD-10-CM | POA: Diagnosis not present

## 2020-02-29 DIAGNOSIS — M79672 Pain in left foot: Secondary | ICD-10-CM

## 2020-02-29 NOTE — Therapy (Signed)
[facility] [address] [place], [place], [address] Phone: [phone]   Fax:  [phone]  Physical Therapy Evaluation  Patient Details  Name: Alan Brooks MRN: [id number] Date of Birth: [birth year] Referring Provider (PT): Clementeen Graham, MD   Encounter Date: 02/29/2020   PT End of Session - 02/29/20 1055    Visit Number 10    Date for PT Re-Evaluation 04/25/20    Authorization Type BCBS    Authorization - Visit Number 10    Authorization - Number of Visits 30    PT Start Time 1017    PT Stop Time 1100    PT Time Calculation (min) 43 min    Activity Tolerance Patient tolerated treatment well    Behavior During Therapy [facility] for tasks assessed/performed           Past Medical History:  Diagnosis Date  . Allergy   . Anxiety   . Asthma     History reviewed. No pertinent surgical history.  There were no vitals filed for this visit.    Subjective Assessment - 02/29/20 1019    Subjective Pt with new diagnosis of neck pain that began 1 month ago.  Pt also with bil foot pain (posterior tibialis tendonitis) that we are currently treating.  Lt hip pain is now 90% better so this will be discharged.    Currently in Pain? Yes    Pain Score 4    up to 9/10   Pain Location Neck    Pain Orientation Right;Left    Pain Descriptors / Indicators Spasm;Sharp;Shooting    Pain Type Chronic pain    Pain Onset More than a month ago    Pain Frequency Intermittent    Aggravating Factors  turning head, quick movements, lifting head    Pain Relieving Factors ice, ibuprofen    Pain Score 7    Pain Location Foot    Pain Orientation Right;Left    Pain Descriptors / Indicators Shooting;Aching;Dull    Pain Type Chronic pain    Pain Onset More than a month ago    Pain Frequency Constant    Aggravating Factors  early morning, walking long periods, standing    Pain Relieving Factors rest, sitting down, wearing shoes (OOFOS),  stretching              [facility] PT Assessment - 02/29/20 0001      Assessment   Medical Diagnosis cervical paraspinal muscle spasm, posterior tibialis tendinitis of bil LEs    Referring Provider (PT) Clementeen Graham, MD      Precautions   Precautions None      Balance Screen   Has the patient fallen in the past 6 months No    Has the patient had a decrease in activity level because of a fear of falling?  No    Is the patient reluctant to leave their home because of a fear of falling?  No      Home Environment   Living Environment Private residence    Living Arrangements Spouse/significant other    Type of Home House      Prior Function   Level of Independence Independent    Vocation Unemployed    Leisure karate, boxing- not able to do due to pain      Cognition   Overall Cognitive Status Within Functional Limits for tasks assessed      Observation/Other Assessments   Focus on Therapeutic Outcomes (FOTO)  41%  limitation (neck)   1% limitation (hip)     AROM   Overall AROM  Deficits    Overall AROM Comments Rt great toe extension 45 deg, Lt 35 deg, UE A/ROM is full    AROM Assessment Site Cervical    Cervical Flexion 50    Cervical Extension 25    Cervical - Right Side Bend 40    Cervical - Left Side Bend 40    Cervical - Right Rotation 35    Cervical - Left Rotation 25      PROM   Overall PROM  Deficits    Overall PROM Comments limited by 25% bil into cervical rotation      Palpation   Spinal mobility decreased PA mobility C2-7 without pain    Palpation comment minor tension in bil cervical paraspinals and suboccipitals      High Level Balance   High Level Balance Comments SLS: difficult on Rt 10 sec, Lt 10+ sec                      Objective measurements completed on examination: See above findings.       [facility] Adult PT Treatment/Exercise - 02/29/20 0001      Iontophoresis   Type of Iontophoresis Dexamethasone    Location Rt and Lt insertion  of post tib. tendon    Dose 1.0 cc   #2   Time 6 hour wear time                    PT Short Term Goals - 02/29/20 1040      PT SHORT TERM GOAL #1   Title be independent in initial HEP    Status Achieved      PT SHORT TERM GOAL #2   Title demonstrate bil neck A/ROM rotation to > or = to 45 degrees to improve safety with driving    Time 8    Period Weeks    Status New    Target Date 03/28/20      PT SHORT TERM GOAL #3   Title report a 25% reduction in neck pain with turning head    Time 4    Period Weeks    Status New    Target Date 03/28/20             PT Long Term Goals - 02/29/20 1024      PT LONG TERM GOAL #1   Title be independent in advanced HEP to include cervical and foot exercises    Time 8    Period Weeks    Status Revised    Target Date 04/25/20      PT LONG TERM GOAL #2   Title reduce FOTO (neck) to < or = to 28% limitation    Time 8    Period Weeks    Status New    Target Date 04/25/20      PT LONG TERM GOAL #3   Title reduce FOTO to < or = to 40% limitation (hip)    Status Achieved      PT LONG TERM GOAL #4   Title report a 60% reduction in Lt LE pain with sitting, standing and walking    Status Achieved      PT LONG TERM GOAL #5   Title Pt will have greater than 55 deg of Lt and Rt great toe extension which will improve his mechanics with walking.    Time 8  Status On-going    Target Date 04/25/20      Additional Long Term Goals   Additional Long Term Goals Yes      PT LONG TERM GOAL #6   Title demonstrate cervical A/ROM rotation to > or = to 65  degrees to improve safety with driving    Time 8    Period Weeks    Status New    Target Date 04/25/20      PT LONG TERM GOAL #7   Title report a 70% reduction in neck pain with turning head with driving    Time 8    Period Weeks    Status New    Target Date 04/25/20                  Plan - 02/29/20 1054    Clinical Impression Statement Pt with continued bil  foot pain at posterior tibialis insertion.  Pt with new diagnosis of neck pain.  Pt reports 90% reduction in Lt hip pain and FOTO is improved to 1%. This area will be discharged.  Pt reports 6-/10 bil foot pain at bil post tib tendon and increases with standing and walking.  Pt demonstrates limited cervical A/ROM in all directions and reports 4-9/10 neck pain with turning head.  Pt reports reduced cervical segmental mobility and tension in bil cervical paraspinals and suboccipitals.  Pt will continue to benefit from skilled PT to address neck pain, stiffness and tissue tension in addition to bil foot pain.    Comorbidities bil plantar fasciitis, widespread pain, neck pain    Stability/Clinical Decision Making Evolving/Moderate complexity    Clinical Decision Making Moderate    Rehab Potential Excellent    PT Frequency 2x / week    PT Duration 8 weeks    PT Treatment/Interventions ADLs/Self Care Home Management;Cryotherapy;Ultrasound;Moist Heat;Iontophoresis 4mg /ml Dexamethasone;Electrical Stimulation;Gait training;Neuromuscular re-education;Therapeutic exercise;Therapeutic activities;Patient/family education;Passive range of motion;Taping;Vasopneumatic Device;Dry needling;Manual techniques;Spinal Manipulations;Joint Manipulations    PT Next Visit Plan Rt foot intrinsic exercises, plantar mobility, dry needling to neck.  Ionto #3    PT Home Exercise Plan [email or web address]    Recommended Other Services recert and neck eval sent 02/29/20    Consulted and Agree with Plan of Care Patient           Patient will benefit from skilled therapeutic intervention in order to improve the following deficits and impairments:  Abnormal gait, Decreased activity tolerance, Impaired flexibility, Decreased range of motion, Decreased endurance, Difficulty walking, Increased muscle spasms  Visit Diagnosis: Cramp and spasm - Plan: PT plan of care cert/re-cert  Pain in thoracic spine - Plan: PT plan of care cert/re-cert  Pain  in right foot - Plan: PT plan of care cert/re-cert  Pain in left foot - Plan: PT plan of care cert/re-cert  Cervicalgia - Plan: PT plan of care cert/re-cert     Problem List Patient Active Problem List   Diagnosis Date Noted  . Psoriasis 01/24/2020  . Thoracic disc herniation 06/11/2018  . Genital warts 10/10/2017  . Rosacea, acne 09/27/2014  . Low back pain 09/27/2014  . Carpal tunnel syndrome of left wrist 01/08/2012  . Anxiety state 05/19/2008  . Asthma 05/19/2008     05/21/2008, PT 02/29/20 11:01 AM  [facility] [address], [address] [place], [place], [place] Phone: [phone]   Fax:  [phone]  Name: Alan Brooks MRN: Beatris Ship Date of Birth: 29-May-1976

## 2020-03-05 ENCOUNTER — Other Ambulatory Visit: Payer: Self-pay

## 2020-03-05 ENCOUNTER — Ambulatory Visit: Payer: BC Managed Care – PPO

## 2020-03-05 DIAGNOSIS — R252 Cramp and spasm: Secondary | ICD-10-CM

## 2020-03-05 DIAGNOSIS — M62830 Muscle spasm of back: Secondary | ICD-10-CM | POA: Diagnosis not present

## 2020-03-05 DIAGNOSIS — M79672 Pain in left foot: Secondary | ICD-10-CM

## 2020-03-05 DIAGNOSIS — M79671 Pain in right foot: Secondary | ICD-10-CM | POA: Diagnosis not present

## 2020-03-05 DIAGNOSIS — M542 Cervicalgia: Secondary | ICD-10-CM

## 2020-03-05 DIAGNOSIS — M546 Pain in thoracic spine: Secondary | ICD-10-CM

## 2020-03-05 DIAGNOSIS — M25552 Pain in left hip: Secondary | ICD-10-CM | POA: Diagnosis not present

## 2020-03-05 NOTE — Therapy (Signed)
[facility] [address], [address] [place], [place], [address] Phone: [phone]   Fax:  [phone]  Physical Therapy Treatment  Patient Details  Name: Alan Brooks MRN: [id number] Date of Birth: 04-19-1976 Referring Provider (PT): Clementeen Graham, MD   Encounter Date: 03/05/2020   PT End of Session - 03/05/20 1056    Visit Number 11    Date for PT Re-Evaluation 04/25/20    Authorization Type BCBS    Authorization - Visit Number 11    Authorization - Number of Visits 30    PT Start Time 1016    PT Stop Time 1058    PT Time Calculation (min) 42 min    Activity Tolerance Patient tolerated treatment well    Behavior During Therapy [facility] for tasks assessed/performed           Past Medical History:  Diagnosis Date  . Allergy   . Anxiety   . Asthma     History reviewed. No pertinent surgical history.  There were no vitals filed for this visit.   Subjective Assessment - 03/05/20 1019    Subjective My neck is still very stiff.  I am doing my stretches.  My Rt foot pain is more noticable    Currently in Pain? Yes    Pain Score 4     Pain Location Neck    Pain Orientation Right;Left    Pain Descriptors / Indicators Aching;Tightness    Pain Onset More than a month ago    Pain Frequency Intermittent    Aggravating Factors  movement, turning head    Pain Relieving Factors ice, ibuprofen    Pain Score 4    Pain Location Foot    Pain Orientation Right;Left    Pain Descriptors / Indicators Shooting;Aching    Pain Onset More than a month ago    Pain Frequency Constant    Aggravating Factors  early morning, walking long periods, standing    Pain Relieving Factors wearing shoes, stretching              [facility] PT Assessment - 03/05/20 0001      AROM   Cervical - Right Side Bend 52    Cervical - Left Side Bend 50    Cervical - Right Rotation 45    Cervical - Left Rotation 40                          [facility] Adult PT Treatment/Exercise - 03/05/20 0001      Exercises   Exercises Neck      Iontophoresis   Type of Iontophoresis Dexamethasone    Location Rt and Lt insertion of post tib. tendon    Dose 1.0 cc   #3   Time 6 hour wear time      Manual Therapy   Manual Therapy Joint mobilization;Soft tissue mobilization    Manual therapy comments elongation to bil neck and upper traps    Joint Mobilization PA mobs grade 3, C3-5      Neck Exercises: Stretches   Upper Trapezius Stretch 3 reps;20 seconds    Neck Stretch 3 reps;20 seconds    Neck Stretch Limitations rotation and flexion    Other Neck Stretches prone cervical rotation            Trigger Point Dry Needling - 03/05/20 0001    Consent Given? Yes    Education Handout Provided Previously provided  Muscles Treated Head and Neck Upper trapezius;Oblique capitus;Cervical multifidi    Upper Trapezius Response Twitch reponse elicited;Palpable increased muscle length    Oblique Capitus Response Twitch response elicited;Palpable increased muscle length    Cervical multifidi Response Twitch reponse elicited;Palpable increased muscle length                  PT Short Term Goals - 02/29/20 1040      PT SHORT TERM GOAL #1   Title be independent in initial HEP    Status Achieved      PT SHORT TERM GOAL #2   Title demonstrate bil neck A/ROM rotation to > or = to 45 degrees to improve safety with driving    Time 8    Period Weeks    Status New    Target Date 03/28/20      PT SHORT TERM GOAL #3   Title report a 25% reduction in neck pain with turning head    Time 4    Period Weeks    Status New    Target Date 03/28/20             PT Long Term Goals - 02/29/20 1024      PT LONG TERM GOAL #1   Title be independent in advanced HEP to include cervical and foot exercises    Time 8    Period Weeks    Status Revised    Target Date 04/25/20      PT LONG TERM GOAL #2   Title  reduce FOTO (neck) to < or = to 28% limitation    Time 8    Period Weeks    Status New    Target Date 04/25/20      PT LONG TERM GOAL #3   Title reduce FOTO to < or = to 40% limitation (hip)    Status Achieved      PT LONG TERM GOAL #4   Title report a 60% reduction in Lt LE pain with sitting, standing and walking    Status Achieved      PT LONG TERM GOAL #5   Title Pt will have greater than 55 deg of Lt and Rt great toe extension which will improve his mechanics with walking.    Time 8    Status On-going    Target Date 04/25/20      Additional Long Term Goals   Additional Long Term Goals Yes      PT LONG TERM GOAL #6   Title demonstrate cervical A/ROM rotation to > or = to 65  degrees to improve safety with driving    Time 8    Period Weeks    Status New    Target Date 04/25/20      PT LONG TERM GOAL #7   Title report a 70% reduction in neck pain with turning head with driving    Time 8    Period Weeks    Status New    Target Date 04/25/20                 Plan - 03/05/20 1059    Clinical Impression Statement Pt with first time follow-up for neck pain.  Session focused on review of HEP for cervical spine and dry needling to address segmental hypomobility and to improve tissue mobility.  Pt demonstrated improved rotational A/ROM in prone and A/ROM in sitting immediately after dry needling and manual therapy.  Pt reports less Lt foot pain  overall.  Rt remains unchanged.  Pt will continue to benefit from skilled PT to address neck pain, limited A/ROM, postural strength and bil foot pain.    PT Frequency 2x / week    PT Duration 8 weeks    PT Treatment/Interventions ADLs/Self Care Home Management;Cryotherapy;Ultrasound;Moist Heat;Iontophoresis 4mg /ml Dexamethasone;Electrical Stimulation;Gait training;Neuromuscular re-education;Therapeutic exercise;Therapeutic activities;Patient/family education;Passive range of motion;Taping;Vasopneumatic Device;Dry needling;Manual  techniques;Spinal Manipulations;Joint Manipulations    PT Next Visit Plan Rt foot intrinsic exercises, plantar mobility,assess  dry needling to neck.  Ionto #4    PT Home Exercise Plan [email or web address]    Consulted and Agree with Plan of Care Patient           Patient will benefit from skilled therapeutic intervention in order to improve the following deficits and impairments:  Abnormal gait, Decreased activity tolerance, Impaired flexibility, Decreased range of motion, Decreased endurance, Difficulty walking, Increased muscle spasms  Visit Diagnosis: Cramp and spasm  Pain in thoracic spine  Pain in right foot  Pain in left foot  Cervicalgia     Problem List Patient Active Problem List   Diagnosis Date Noted  . Psoriasis 01/24/2020  . Thoracic disc herniation 06/11/2018  . Genital warts 10/10/2017  . Rosacea, acne 09/27/2014  . Low back pain 09/27/2014  . Carpal tunnel syndrome of left wrist 01/08/2012  . Anxiety state 05/19/2008  . Asthma 05/19/2008    05/21/2008, PT 03/05/20 11:00 AM  [facility] [address], [address] [place], [place], [place] Phone: [phone]   Fax:  [phone]  Name: Alan Brooks MRN: Beatris Ship Date of Birth: 1976-02-04

## 2020-03-07 ENCOUNTER — Encounter: Payer: Self-pay | Admitting: Physical Therapy

## 2020-03-07 ENCOUNTER — Other Ambulatory Visit: Payer: Self-pay

## 2020-03-07 ENCOUNTER — Ambulatory Visit: Payer: BC Managed Care – PPO | Admitting: Physical Therapy

## 2020-03-07 DIAGNOSIS — M62830 Muscle spasm of back: Secondary | ICD-10-CM | POA: Diagnosis not present

## 2020-03-07 DIAGNOSIS — M79671 Pain in right foot: Secondary | ICD-10-CM

## 2020-03-07 DIAGNOSIS — M542 Cervicalgia: Secondary | ICD-10-CM

## 2020-03-07 DIAGNOSIS — M25552 Pain in left hip: Secondary | ICD-10-CM

## 2020-03-07 DIAGNOSIS — R252 Cramp and spasm: Secondary | ICD-10-CM | POA: Diagnosis not present

## 2020-03-07 DIAGNOSIS — M79672 Pain in left foot: Secondary | ICD-10-CM

## 2020-03-07 DIAGNOSIS — M546 Pain in thoracic spine: Secondary | ICD-10-CM | POA: Diagnosis not present

## 2020-03-07 NOTE — Therapy (Signed)
[facility] [address] [place], [place], [address] Phone: [phone]   Fax:  [phone]  Physical Therapy Treatment  Patient Details  Name: Alan Brooks MRN: [id number] Date of Birth: March 29, 1976 Referring Provider (PT): Clementeen Graham, MD   Encounter Date: 03/07/2020   PT End of Session - 03/07/20 1108    Visit Number 12    Date for PT Re-Evaluation 04/25/20    Authorization Type BCBS    Authorization - Visit Number 12    Authorization - Number of Visits 30    PT Start Time 1016    PT Stop Time 1058    PT Time Calculation (min) 42 min    Activity Tolerance Patient tolerated treatment well    Behavior During Therapy [facility] for tasks assessed/performed           Past Medical History:  Diagnosis Date  . Allergy   . Anxiety   . Asthma     History reviewed. No pertinent surgical history.  There were no vitals filed for this visit.   Subjective Assessment - 03/07/20 1017    Subjective Pt states that the needling helped with his ROM in the neck.    Currently in Pain? Yes    Pain Score 1     Pain Location Neck    Pain Orientation Right;Left;Upper    Pain Descriptors / Indicators Sore    Pain Radiating Towards none    Pain Onset More than a month ago    Pain Frequency Constant    Aggravating Factors  looking Rt and Lt    Pain Onset More than a month ago                             [facility] Adult PT Treatment/Exercise - 03/07/20 0001      Knee/Hip Exercises: Standing   Other Standing Knee Exercises bilateral deadlift      Iontophoresis   Type of Iontophoresis Dexamethasone    Location Rt and Lt insertion of post tib. tendon    Dose 1.0 cc   #4   Time 6 hour wear time      Manual Therapy   Joint Mobilization C1/C2 rotation mobilization Lt; Lt cervical rotation MWM 2x10 reps C3/C4      Neck Exercises: Stretches   Other Neck Stretches Lt and Rt cervical rotation with towel  assistance x10 reps       Ankle Exercises: Seated   Other Seated Ankle Exercises feet on floor: great toe extension x10 reps, great toe flexion x10, gross toe abduction x10    Other Seated Ankle Exercises arch squeeze with sit to stand x10 reps;       Ankle Exercises: Standing   Other Standing Ankle Exercises standing arch squeeze x8 reps                   PT Education - 03/07/20 1056    Education Details technique with therex    Person(s) Educated Patient    Methods Explanation    Comprehension Verbalized understanding            PT Short Term Goals - 02/29/20 1040      PT SHORT TERM GOAL #1   Title be independent in initial HEP    Status Achieved      PT SHORT TERM GOAL #2   Title demonstrate bil neck A/ROM rotation to > or = to  45 degrees to improve safety with driving    Time 8    Period Weeks    Status New    Target Date 03/28/20      PT SHORT TERM GOAL #3   Title report a 25% reduction in neck pain with turning head    Time 4    Period Weeks    Status New    Target Date 03/28/20             PT Long Term Goals - 02/29/20 1024      PT LONG TERM GOAL #1   Title be independent in advanced HEP to include cervical and foot exercises    Time 8    Period Weeks    Status Revised    Target Date 04/25/20      PT LONG TERM GOAL #2   Title reduce FOTO (neck) to < or = to 28% limitation    Time 8    Period Weeks    Status New    Target Date 04/25/20      PT LONG TERM GOAL #3   Title reduce FOTO to < or = to 40% limitation (hip)    Status Achieved      PT LONG TERM GOAL #4   Title report a 60% reduction in Lt LE pain with sitting, standing and walking    Status Achieved      PT LONG TERM GOAL #5   Title Pt will have greater than 55 deg of Lt and Rt great toe extension which will improve his mechanics with walking.    Time 8    Status On-going    Target Date 04/25/20      Additional Long Term Goals   Additional Long Term Goals Yes      PT  LONG TERM GOAL #6   Title demonstrate cervical A/ROM rotation to > or = to 65  degrees to improve safety with driving    Time 8    Period Weeks    Status New    Target Date 04/25/20      PT LONG TERM GOAL #7   Title report a 70% reduction in neck pain with turning head with driving    Time 8    Period Weeks    Status New    Target Date 04/25/20                 Plan - 03/07/20 1108    Clinical Impression Statement Pt had improved cervical ROM following dry needling treatment last session. PT completed upper cervical rotation mobilization with improved pain rotating to the Lt. Pt had questions regarding hamstring stretch for home and PT address this. Remainder of the session focused on therex to improve intrinsic foot strength. Pt has limited Lt great to extension but has only been working on the Rt at home. He had difficulty with great toe flexion and extension more on the Lt than on the Rt. He demonstrated good understanding of HEP updates.    PT Frequency 2x / week    PT Duration 8 weeks    PT Treatment/Interventions ADLs/Self Care Home Management;Cryotherapy;Ultrasound;Moist Heat;Iontophoresis 4mg /ml Dexamethasone;Electrical Stimulation;Gait training;Neuromuscular re-education;Therapeutic exercise;Therapeutic activities;Patient/family education;Passive range of motion;Taping;Vasopneumatic Device;Dry needling;Manual techniques;Spinal Manipulations;Joint Manipulations    PT Next Visit Plan Rt foot intrinsic exercises, ankle strength, plantar mobility,assess  dry needling to neck.  Ionto #5    PT Home Exercise Plan [email or web address]    Consulted and Agree with Plan of Care  Patient           Patient will benefit from skilled therapeutic intervention in order to improve the following deficits and impairments:  Abnormal gait, Decreased activity tolerance, Impaired flexibility, Decreased range of motion, Decreased endurance, Difficulty walking, Increased muscle spasms  Visit Diagnosis: Cramp  and spasm  Pain in thoracic spine  Pain in right foot  Pain in left foot  Cervicalgia  Pain in left hip  Muscle spasm of back     Problem List Patient Active Problem List   Diagnosis Date Noted  . Psoriasis 01/24/2020  . Thoracic disc herniation 06/11/2018  . Genital warts 10/10/2017  . Rosacea, acne 09/27/2014  . Low back pain 09/27/2014  . Carpal tunnel syndrome of left wrist 01/08/2012  . Anxiety state 05/19/2008  . Asthma 05/19/2008    11:15 AM,03/07/20 Donita Brooks PT, DPT [facility]  [phone]  [facility] [address], [address] [place], [place], [address] Phone: [phone]   Fax:  [phone]  Name: Alan Brooks MRN: [id number] Date of Birth: Jul 25, 1976

## 2020-03-07 NOTE — Patient Instructions (Signed)
Access Code: [email or web address]: [email or web address]: 07/14/2021Prepared by: [facility]  Deadlift with Kettlebell - 1 x daily - 7 x weekly - 2 sets - 10 reps  Bridge with Heels on [employer] - 1 x daily - 7 x weekly - 2-3 sets - 10 reps  Arch Lifting - 1 x daily - 7 x weekly - 10 reps - 5 seconds hold  Seated Self Great Toe Stretch - 1 x daily - 7 x weekly - 5-10 reps - 10 sec hold  Supine Hip Internal and External Rotation - 2 x daily - 7 x weekly - 15 reps  Seated Cervical Flexion AROM - 3 x daily - 7 x weekly - 3 reps - 1 sets - 20 hold  Seated Cervical Sidebending AROM - 3 x daily - 7 x weekly - 3 reps - 1 sets - 20 hold  Seated Correct Posture - 1 x daily - 7 x weekly - 10 reps - 3 sets  Cervical Rotation Prone on Elbows - 3 x daily - 7 x weekly - 1 sets - 10 reps - 3-5 hold  [facility] [address], [address] [place], [place] [address] Phone # [phone] Fax [phone]

## 2020-03-12 ENCOUNTER — Other Ambulatory Visit: Payer: Self-pay

## 2020-03-12 ENCOUNTER — Ambulatory Visit: Payer: BC Managed Care – PPO

## 2020-03-12 DIAGNOSIS — M62830 Muscle spasm of back: Secondary | ICD-10-CM | POA: Diagnosis not present

## 2020-03-12 DIAGNOSIS — R252 Cramp and spasm: Secondary | ICD-10-CM

## 2020-03-12 DIAGNOSIS — M546 Pain in thoracic spine: Secondary | ICD-10-CM | POA: Diagnosis not present

## 2020-03-12 DIAGNOSIS — M79671 Pain in right foot: Secondary | ICD-10-CM | POA: Diagnosis not present

## 2020-03-12 DIAGNOSIS — M25552 Pain in left hip: Secondary | ICD-10-CM | POA: Diagnosis not present

## 2020-03-12 DIAGNOSIS — M79672 Pain in left foot: Secondary | ICD-10-CM

## 2020-03-12 DIAGNOSIS — M542 Cervicalgia: Secondary | ICD-10-CM | POA: Diagnosis not present

## 2020-03-12 NOTE — Therapy (Signed)
[facility] [address], [address] [place], [place], [address] Phone: [phone]   Fax:  [phone]  Physical Therapy Treatment  Patient Details  Name: Alan Brooks MRN: [id number] Date of Birth: 01-Apr-1976 Referring Provider (PT): Clementeen Graham, MD   Encounter Date: 03/12/2020   PT End of Session - 03/12/20 1059    Visit Number 13    Date for PT Re-Evaluation 04/25/20    Authorization Type BCBS    Authorization - Visit Number 13    Authorization - Number of Visits 30    PT Start Time 1017    PT Stop Time 1059    PT Time Calculation (min) 42 min    Activity Tolerance Patient tolerated treatment well    Behavior During Therapy [facility] for tasks assessed/performed           Past Medical History:  Diagnosis Date  . Allergy   . Anxiety   . Asthma     History reviewed. No pertinent surgical history.  There were no vitals filed for this visit.   Subjective Assessment - 03/12/20 1016    Subjective My neck feels better overall. It feels 20% better overall.  I lifted a heavy dresser yesterday and my feet didn't like that.    Currently in Pain? Yes    Pain Score 5     Pain Location Neck    Pain Orientation Left;Right    Pain Descriptors / Indicators Tightness;Aching;Shooting;Constant    Pain Onset More than a month ago    Pain Frequency Constant    Aggravating Factors  turning head    Pain Relieving Factors ice, ibuprofen, stretching    Pain Score 2   with standing 7/10   Pain Location Foot    Pain Orientation Right;Left    Pain Descriptors / Indicators Shooting;Aching    Pain Type Chronic pain    Pain Onset More than a month ago    Pain Frequency Constant    Aggravating Factors  walking, standing    Pain Relieving Factors wearing shoes, stretching                             [facility] Adult PT Treatment/Exercise - 03/12/20 0001      Knee/Hip Exercises: Aerobic   Nustep Level 3 arms and legs  x 9 minutes    PT present to discuss progress     Iontophoresis   Type of Iontophoresis Dexamethasone    Location Rt and Lt insertion of post tib. tendon    Dose 1.0 cc   #5   Time 6 hour wear time      Manual Therapy   Manual Therapy Joint mobilization;Soft tissue mobilization    Manual therapy comments elongation to bil neck and upper traps    Joint Mobilization --      Neck Exercises: Stretches   Other Neck Stretches Lt and Rt cervical rotation with towel assistance x10 reps       Ankle Exercises: Seated   Other Seated Ankle Exercises feet on floor: great toe extension x10 reps, great toe flexion x10, gross toe abduction x10    Other Seated Ankle Exercises marble pick up- 1 cup each foot            Trigger Point Dry Needling - 03/12/20 0001    Consent Given? Yes    Education Handout Provided Previously provided    Muscles Treated Head and Neck  Upper trapezius;Oblique capitus;Cervical multifidi    Other Dry Needling thoracic multifidi    Upper Trapezius Response Twitch reponse elicited;Palpable increased muscle length    Oblique Capitus Response Twitch response elicited;Palpable increased muscle length    Cervical multifidi Response Twitch reponse elicited;Palpable increased muscle length                  PT Short Term Goals - 02/29/20 1040      PT SHORT TERM GOAL #1   Title be independent in initial HEP    Status Achieved      PT SHORT TERM GOAL #2   Title demonstrate bil neck A/ROM rotation to > or = to 45 degrees to improve safety with driving    Time 8    Period Weeks    Status New    Target Date 03/28/20      PT SHORT TERM GOAL #3   Title report a 25% reduction in neck pain with turning head    Time 4    Period Weeks    Status New    Target Date 03/28/20             PT Long Term Goals - 02/29/20 1024      PT LONG TERM GOAL #1   Title be independent in advanced HEP to include cervical and foot exercises    Time 8    Period Weeks     Status Revised    Target Date 04/25/20      PT LONG TERM GOAL #2   Title reduce FOTO (neck) to < or = to 28% limitation    Time 8    Period Weeks    Status New    Target Date 04/25/20      PT LONG TERM GOAL #3   Title reduce FOTO to < or = to 40% limitation (hip)    Status Achieved      PT LONG TERM GOAL #4   Title report a 60% reduction in Lt LE pain with sitting, standing and walking    Status Achieved      PT LONG TERM GOAL #5   Title Pt will have greater than 55 deg of Lt and Rt great toe extension which will improve his mechanics with walking.    Time 8    Status On-going    Target Date 04/25/20      Additional Long Term Goals   Additional Long Term Goals Yes      PT LONG TERM GOAL #6   Title demonstrate cervical A/ROM rotation to > or = to 65  degrees to improve safety with driving    Time 8    Period Weeks    Status New    Target Date 04/25/20      PT LONG TERM GOAL #7   Title report a 70% reduction in neck pain with turning head with driving    Time 8    Period Weeks    Status New    Target Date 04/25/20                 Plan - 03/12/20 1024    Clinical Impression Statement Pt reports 20-25% overall improvement in neck pain since the start of care.  Pt with localized bil foot pain with up to 7/10 pain with standing.  Pt continues to work on foot intrinsic exercises and flexibility.  Pt wears shoes with an arch support all day now.  Pt with improved  cervical A/ROM overall and is still with limited A/ROM.  Pt with tension and trigger points in bil neck and demonstrated improved mobility and segmental movement after manual therapy today.  Pt will continue to benefit from skilled PT to address bil foot pain, cervical mobility and postural strength.    Rehab Potential Excellent    PT Frequency 2x / week    PT Duration 8 weeks    PT Treatment/Interventions ADLs/Self Care Home Management;Cryotherapy;Ultrasound;Moist Heat;Iontophoresis 4mg /ml  Dexamethasone;Electrical Stimulation;Gait training;Neuromuscular re-education;Therapeutic exercise;Therapeutic activities;Patient/family education;Passive range of motion;Taping;Vasopneumatic Device;Dry needling;Manual techniques;Spinal Manipulations;Joint Manipulations    PT Next Visit Plan Rt foot intrinsic exercises, ankle strength, plantar mobility,assess  dry needling to neck.  Ionto #6.  measure cervical A/ROM    PT Home Exercise Plan [id number]    Recommended Other Services recert signed           Patient will benefit from skilled therapeutic intervention in order to improve the following deficits and impairments:  Abnormal gait, Decreased activity tolerance, Impaired flexibility, Decreased range of motion, Decreased endurance, Difficulty walking, Increased muscle spasms  Visit Diagnosis: Cramp and spasm  Pain in thoracic spine  Pain in right foot  Pain in left foot  Cervicalgia     Problem List Patient Active Problem List   Diagnosis Date Noted  . Psoriasis 01/24/2020  . Thoracic disc herniation 06/11/2018  . Genital warts 10/10/2017  . Rosacea, acne 09/27/2014  . Low back pain 09/27/2014  . Carpal tunnel syndrome of left wrist 01/08/2012  . Anxiety state 05/19/2008  . Asthma 05/19/2008    05/21/2008, PT 03/12/20 11:01 AM  [facility] [address], [address] [place], [place], [place] Phone: [phone]   Fax:  [phone]  Name: KAIREN HALLINAN MRN: Beatris Ship Date of Birth: 1975/11/02

## 2020-03-13 DIAGNOSIS — R768 Other specified abnormal immunological findings in serum: Secondary | ICD-10-CM | POA: Diagnosis not present

## 2020-03-13 DIAGNOSIS — M2550 Pain in unspecified joint: Secondary | ICD-10-CM | POA: Diagnosis not present

## 2020-03-13 DIAGNOSIS — L401 Generalized pustular psoriasis: Secondary | ICD-10-CM | POA: Diagnosis not present

## 2020-03-14 ENCOUNTER — Ambulatory Visit: Payer: BC Managed Care – PPO | Admitting: Physical Therapy

## 2020-03-14 ENCOUNTER — Other Ambulatory Visit: Payer: Self-pay

## 2020-03-14 ENCOUNTER — Encounter: Payer: Self-pay | Admitting: Physical Therapy

## 2020-03-14 DIAGNOSIS — M79672 Pain in left foot: Secondary | ICD-10-CM | POA: Diagnosis not present

## 2020-03-14 DIAGNOSIS — M25552 Pain in left hip: Secondary | ICD-10-CM | POA: Diagnosis not present

## 2020-03-14 DIAGNOSIS — M546 Pain in thoracic spine: Secondary | ICD-10-CM

## 2020-03-14 DIAGNOSIS — R252 Cramp and spasm: Secondary | ICD-10-CM

## 2020-03-14 DIAGNOSIS — M79671 Pain in right foot: Secondary | ICD-10-CM

## 2020-03-14 DIAGNOSIS — M62830 Muscle spasm of back: Secondary | ICD-10-CM | POA: Diagnosis not present

## 2020-03-14 DIAGNOSIS — M542 Cervicalgia: Secondary | ICD-10-CM | POA: Diagnosis not present

## 2020-03-14 NOTE — Therapy (Signed)
[facility] [address] [place], [place], [address] Phone: [phone]   Fax:  [phone]  Physical Therapy Treatment  Patient Details  Name: Alan Brooks MRN: [id number] Date of Birth: 10-13-1975 Referring Provider (PT): Clementeen Graham, MD   Encounter Date: 03/14/2020   PT End of Session - 03/14/20 1711    Visit Number 14    Date for PT Re-Evaluation 04/25/20    Authorization Type BCBS    Authorization - Visit Number 14    Authorization - Number of Visits 30    PT Start Time 1531    PT Stop Time 1610    PT Time Calculation (min) 39 min    Activity Tolerance Patient tolerated treatment well    Behavior During Therapy [facility] for tasks assessed/performed           Past Medical History:  Diagnosis Date  . Allergy   . Anxiety   . Asthma     History reviewed. No pertinent surgical history.  There were no vitals filed for this visit.   Subjective Assessment - 03/14/20 1532    Subjective Pt states that things are going well. Everything seems to be improving except for his feet.    Currently in Pain? No/denies   no while sitting her   Pain Onset More than a month ago    Pain Onset More than a month ago              [facility] PT Assessment - 03/14/20 0001      AROM   Cervical - Right Rotation 45    Cervical - Left Rotation 40                         [facility] Adult PT Treatment/Exercise - 03/14/20 0001      Manual Therapy   Joint Mobilization Rt and Lt great toe extension mobilization grade IV x2 bouts; Rt and Lt navicular/cuneiform and 1st metatarsal/cuneiform mobilization x2 bouts       Ankle Exercises: Seated   Heel Raises Both;10 reps   x3 sets, focus on proper push off technique    Other Seated Ankle Exercises Rt and Lt ankle inversion isometric hold 10x5 sec hold     Other Seated Ankle Exercises Rt and Lt plantar flexion 2x20 reps with combined great toe flexion- blue TB        Ankle Exercises: Standing   Other Standing Ankle Exercises unable to complete B LE heel raise without increase in pain                   PT Education - 03/14/20 1711    Education Details updated HEP    Person(s) Educated Patient    Methods Explanation;Demonstration;Handout    Comprehension Verbalized understanding;Returned demonstration            PT Short Term Goals - 02/29/20 1040      PT SHORT TERM GOAL #1   Title be independent in initial HEP    Status Achieved      PT SHORT TERM GOAL #2   Title demonstrate bil neck A/ROM rotation to > or = to 45 degrees to improve safety with driving    Time 8    Period Weeks    Status New    Target Date 03/28/20      PT SHORT TERM GOAL #3   Title report a 25% reduction in neck pain with turning  head    Time 4    Period Weeks    Status New    Target Date 03/28/20             PT Long Term Goals - 02/29/20 1024      PT LONG TERM GOAL #1   Title be independent in advanced HEP to include cervical and foot exercises    Time 8    Period Weeks    Status Revised    Target Date 04/25/20      PT LONG TERM GOAL #2   Title reduce FOTO (neck) to < or = to 28% limitation    Time 8    Period Weeks    Status New    Target Date 04/25/20      PT LONG TERM GOAL #3   Title reduce FOTO to < or = to 40% limitation (hip)    Status Achieved      PT LONG TERM GOAL #4   Title report a 60% reduction in Lt LE pain with sitting, standing and walking    Status Achieved      PT LONG TERM GOAL #5   Title Pt will have greater than 55 deg of Lt and Rt great toe extension which will improve his mechanics with walking.    Time 8    Status On-going    Target Date 04/25/20      Additional Long Term Goals   Additional Long Term Goals Yes      PT LONG TERM GOAL #6   Title demonstrate cervical A/ROM rotation to > or = to 65  degrees to improve safety with driving    Time 8    Period Weeks    Status New    Target Date 04/25/20       PT LONG TERM GOAL #7   Title report a 70% reduction in neck pain with turning head with driving    Time 8    Period Weeks    Status New    Target Date 04/25/20                 Plan - 03/14/20 1712    Clinical Impression Statement Pt continues to have foot pain bilaterally with daily activity. He noted improvements in pain today after waking up but increased his activity which causes pain prior to arriving to his appointment. PT educated pt on the importance of easing back into higher activity that he doesn't usually do during the week to avoid aggravating his feet and delaying recovery. Session focused on progressing ankle strength. Pt is unable to complete bilateral heel raises in standing but was able to complete plantar flexion with theraband resistance without increase in pain. PT completed manual techniques to address midfoot and great toe flexibility. Pt denied increase in pain following today's exercises and demonstrated understanding of all HEP updates. He would continue to benefit from therex progression to increase ankle strength and endurance.    Rehab Potential Excellent    PT Frequency 2x / week    PT Duration 8 weeks    PT Treatment/Interventions ADLs/Self Care Home Management;Cryotherapy;Ultrasound;Moist Heat;Iontophoresis 4mg /ml Dexamethasone;Electrical Stimulation;Gait training;Neuromuscular re-education;Therapeutic exercise;Therapeutic activities;Patient/family education;Passive range of motion;Taping;Vasopneumatic Device;Dry needling;Manual techniques;Spinal Manipulations;Joint Manipulations    PT Next Visit Plan progress PF/inversion strength; dry needling to neck and flexibility/strength progression.  Ionto #6.    PT Home Exercise Plan [id number]           Patient will benefit from skilled therapeutic  intervention in order to improve the following deficits and impairments:  Abnormal gait, Decreased activity tolerance, Impaired flexibility, Decreased range of motion,  Decreased endurance, Difficulty walking, Increased muscle spasms  Visit Diagnosis: Cramp and spasm  Pain in thoracic spine  Pain in right foot     Problem List Patient Active Problem List   Diagnosis Date Noted  . Psoriasis 01/24/2020  . Thoracic disc herniation 06/11/2018  . Genital warts 10/10/2017  . Rosacea, acne 09/27/2014  . Low back pain 09/27/2014  . Carpal tunnel syndrome of left wrist 01/08/2012  . Anxiety state 05/19/2008  . Asthma 05/19/2008    5:17 PM,03/14/20 Donita Brooks PT, DPT [facility]  [phone]  [facility] [address], [address] [place], [place], [address] Phone: [phone]   Fax:  [phone]  Name: Alan Brooks MRN: [id number] Date of Birth: 1976/01/13

## 2020-03-16 ENCOUNTER — Other Ambulatory Visit: Payer: Self-pay | Admitting: Adult Health

## 2020-03-16 MED FILL — FLUOCINONIDE 0.05% SOLUTION: 0.05 | 20 days supply | Qty: 60 | Fill #1

## 2020-03-16 MED FILL — MELOXICAM 15 MG TABLET: 15 | 30 days supply | Qty: 30 | Fill #1

## 2020-03-20 NOTE — Telephone Encounter (Signed)
Denied.  Pt is using Wixela and rescue inhaler.

## 2020-03-23 ENCOUNTER — Other Ambulatory Visit: Payer: Self-pay | Admitting: Adult Health

## 2020-03-23 ENCOUNTER — Telehealth: Payer: Self-pay | Admitting: Adult Health

## 2020-03-23 MED ORDER — FLUTICASONE-SALMETEROL 250-50 MCG/DOSE IN AEPB: 1.0000 | INHALATION_SPRAY | Freq: Two times a day (BID) | RESPIRATORY_TRACT | 11 refills | Status: DC

## 2020-03-23 MED FILL — ADVAIR 250/50 DISKUS: 250-50 | 30 days supply | Qty: 60 | Fill #0

## 2020-03-23 NOTE — Telephone Encounter (Signed)
Rx sent in. Patient has been made aware.  

## 2020-03-23 NOTE — Telephone Encounter (Signed)
Pt is calling to have his medication   Fluticasone-Salmeterol (WIXELA INHUB) 250-50 MCG/DOSE AEPB   Refilled. It expired 07.07.2021  Send to [facility]  Please advise

## 2020-04-04 ENCOUNTER — Ambulatory Visit: Payer: Self-pay

## 2020-04-04 ENCOUNTER — Ambulatory Visit: Payer: BC Managed Care – PPO | Admitting: Family Medicine

## 2020-04-04 ENCOUNTER — Ambulatory Visit: Payer: BC Managed Care – PPO | Attending: Family Medicine | Admitting: Physical Therapy

## 2020-04-04 ENCOUNTER — Other Ambulatory Visit: Payer: Self-pay | Admitting: Family Medicine

## 2020-04-04 ENCOUNTER — Encounter: Payer: Self-pay | Admitting: Family Medicine

## 2020-04-04 ENCOUNTER — Other Ambulatory Visit: Payer: Self-pay

## 2020-04-04 VITALS — BP 110/80 | HR 80 | Ht 74.5 in | Wt 195.4 lb

## 2020-04-04 DIAGNOSIS — R252 Cramp and spasm: Secondary | ICD-10-CM | POA: Insufficient documentation

## 2020-04-04 DIAGNOSIS — M76822 Posterior tibial tendinitis, left leg: Secondary | ICD-10-CM

## 2020-04-04 DIAGNOSIS — M79671 Pain in right foot: Secondary | ICD-10-CM

## 2020-04-04 DIAGNOSIS — M546 Pain in thoracic spine: Secondary | ICD-10-CM | POA: Diagnosis not present

## 2020-04-04 DIAGNOSIS — M76821 Posterior tibial tendinitis, right leg: Secondary | ICD-10-CM

## 2020-04-04 DIAGNOSIS — M542 Cervicalgia: Secondary | ICD-10-CM | POA: Diagnosis not present

## 2020-04-04 DIAGNOSIS — M79672 Pain in left foot: Secondary | ICD-10-CM

## 2020-04-04 MED ORDER — NITROGLYCERIN 0.2 MG/HR TD PT24: MEDICATED_PATCH | TRANSDERMAL | 1 refills | Status: DC

## 2020-04-04 MED FILL — NITROGLYCERIN 0.2 MG/HR PTC: 0.2 | 28 days supply | Qty: 7 | Fill #0

## 2020-04-04 NOTE — Therapy (Signed)
[facility] [address] [place], [place], [address] Phone: [phone]   Fax:  [phone]  Physical Therapy Treatment  Patient Details  Name: Alan Brooks MRN: [id number] Date of Birth: 27-Jun-1976 Referring Provider (PT): Clementeen Graham, MD   Encounter Date: 04/04/2020   PT End of Session - 04/04/20 0932    Visit Number 15    Date for PT Re-Evaluation 04/25/20    Authorization Type BCBS    Authorization - Visit Number 14    Authorization - Number of Visits 30    PT Start Time 0800    PT Stop Time 0844    PT Time Calculation (min) 44 min    Activity Tolerance Patient tolerated treatment well;No increased pain    Behavior During Therapy [facility] for tasks assessed/performed           Past Medical History:  Diagnosis Date  . Allergy   . Anxiety   . Asthma     No past surgical history on file.  There were no vitals filed for this visit.   Subjective Assessment - 04/04/20 0934    Subjective Pt stopped taking his medication for 2 days last week. He had increase in pain with karate and felt this was more intense than previously. Pain with walking more on the Rt. The Lt may be getting better.    Currently in Pain? Other (Comment)   only when walking   Pain Onset More than a month ago    Pain Onset More than a month ago                             [facility] Adult PT Treatment/Exercise - 04/04/20 0001      Manual Therapy   Joint Mobilization Rt and Lt great toe extension mobilization grade IV x2 bouts; Rt and Lt navicular/cuneiform and 1st metatarsal/cuneiform mobilization x2 bouts       Ankle Exercises: Seated   BAPS Level 1;Sitting;5 reps   each direction    Other Seated Ankle Exercises Rt and Lt slider under table with great toe stretch    Other Seated Ankle Exercises Rt plantarflexion/toe flexion Rt red TB 2x10 reps, Lt black TB 2x10 reps                   PT Education -  04/04/20 0932    Education Details change in theraband    Person(s) Educated Patient    Methods Explanation    Comprehension Verbalized understanding            PT Short Term Goals - 02/29/20 1040      PT SHORT TERM GOAL #1   Title be independent in initial HEP    Status Achieved      PT SHORT TERM GOAL #2   Title demonstrate bil neck A/ROM rotation to > or = to 45 degrees to improve safety with driving    Time 8    Period Weeks    Status New    Target Date 03/28/20      PT SHORT TERM GOAL #3   Title report a 25% reduction in neck pain with turning head    Time 4    Period Weeks    Status New    Target Date 03/28/20             PT Long Term Goals - 02/29/20 1024  PT LONG TERM GOAL #1   Title be independent in advanced HEP to include cervical and foot exercises    Time 8    Period Weeks    Status Revised    Target Date 04/25/20      PT LONG TERM GOAL #2   Title reduce FOTO (neck) to < or = to 28% limitation    Time 8    Period Weeks    Status New    Target Date 04/25/20      PT LONG TERM GOAL #3   Title reduce FOTO to < or = to 40% limitation (hip)    Status Achieved      PT LONG TERM GOAL #4   Title report a 60% reduction in Lt LE pain with sitting, standing and walking    Status Achieved      PT LONG TERM GOAL #5   Title Pt will have greater than 55 deg of Lt and Rt great toe extension which will improve his mechanics with walking.    Time 8    Status On-going    Target Date 04/25/20      Additional Long Term Goals   Additional Long Term Goals Yes      PT LONG TERM GOAL #6   Title demonstrate cervical A/ROM rotation to > or = to 65  degrees to improve safety with driving    Time 8    Period Weeks    Status New    Target Date 04/25/20      PT LONG TERM GOAL #7   Title report a 70% reduction in neck pain with turning head with driving    Time 8    Period Weeks    Status New    Target Date 04/25/20                 Plan -  04/04/20 0932    Clinical Impression Statement Pt has been doing ok since his last appointment. He stopped taking his prescription and noticed more intense pain with karate participation. PT educated pt on the benefits of activity modification and encouraged decreasing the amount of time spent in karate class regardless of whether he is taking his pain medication or not. Pt was able to progress resistance with plantarflexion on the Lt but had to decrease resistance on the Rt secondary to pain. He would continue to benefit from skilled PT moving forward.    Rehab Potential Excellent    PT Frequency 2x / week    PT Duration 8 weeks    PT Treatment/Interventions ADLs/Self Care Home Management;Cryotherapy;Ultrasound;Moist Heat;Iontophoresis 4mg /ml Dexamethasone;Electrical Stimulation;Gait training;Neuromuscular re-education;Therapeutic exercise;Therapeutic activities;Patient/family education;Passive range of motion;Taping;Vasopneumatic Device;Dry needling;Manual techniques;Spinal Manipulations;Joint Manipulations    PT Next Visit Plan progress PF/inversion strength; dry needling to neck and flexibility/strength progression.  Ionto #6.    PT Home Exercise Plan [id number]           Patient will benefit from skilled therapeutic intervention in order to improve the following deficits and impairments:  Abnormal gait, Decreased activity tolerance, Impaired flexibility, Decreased range of motion, Decreased endurance, Difficulty walking, Increased muscle spasms  Visit Diagnosis: Cramp and spasm  Pain in thoracic spine  Pain in right foot  Pain in left foot  Cervicalgia     Problem List Patient Active Problem List   Diagnosis Date Noted  . Psoriasis 01/24/2020  . Thoracic disc herniation 06/11/2018  . Genital warts 10/10/2017  . Rosacea, acne 09/27/2014  . Low back  pain 09/27/2014  . Carpal tunnel syndrome of left wrist 01/08/2012  . Anxiety state 05/19/2008  . Asthma 05/19/2008    9:35  AM,04/04/20 Donita Brooks PT, DPT [facility]  [phone]  [facility] [address] [place], [place], [address] Phone: [phone]   Fax:  [phone]  Name: Alan Brooks MRN: [id number] Date of Birth: [birth year]

## 2020-04-04 NOTE — Patient Instructions (Signed)
Thank you for coming in today.  Plan to continue the exercises.  Use arch support.  Use nitro patches.  Recheck in 6 weeks.  Nitroglycerin Protocol   Apply 1/4 nitroglycerin patch to affected area daily.  Change position of patch within the affected area every 24 hours.  You may experience a headache during the first 1-2 weeks of using the patch, these should subside.  If you experience headaches after beginning nitroglycerin patch treatment, you may take your preferred over the counter pain reliever.  Another side effect of the nitroglycerin patch is skin irritation or rash related to patch adhesive.  Please notify our office if you develop more severe headaches or rash, and stop the patch.  Tendon healing with nitroglycerin patch may require 12 to 24 weeks depending on the extent of injury.  Men should not use if taking Viagra, Cialis, or Levitra.   Do not use if you have migraines or rosacea.

## 2020-04-04 NOTE — Progress Notes (Signed)
   I, Christoper Fabian, LAT, ATC, am serving as scribe for Dr. Clementeen Graham.  Alan Brooks is a 44 y.o. male who presents to [facility] today for f/u of neck, back and B foot pain.  He was last seen by Dr. Denyse Amass on 02/22/20 and was advised to con't PT of which he has completed 14 visits.  He was advised to use Meloxicam prn.  Since his last visit, pt reports B foot pain, R >L.  He locates his pain to his medial feet, just distal to his medial malleolus.  He describes his pain as constant but is worse w/ weight-bearing.  He is currently doing PT and feels like his L foot is getting better but thinks his R foot is worse.  He states that his neck has improved.  Pain is located at medial midfoot near the navicular prominence.  He has pain worsened when he did a lot of barefoot karate.  He notes to be attending school [employer] and left to do a lot of walking if he cannot get a handicap parking sticker.  He does prolonged walking exacerbates his pain.  Diagnostic imaging: R and L foot XR- 06/06/19  Pertinent review of systems: No fevers or chills  Relevant historical information: Psoriasis   Exam:  BP 110/80 (BP Location: Right Arm, Patient Position: Sitting, Cuff Size: Normal)   Pulse 80   Ht 6' 2.5" (1.892 m)   Wt 195 lb 6.4 oz (88.6 kg)   SpO2 98%   BMI 24.75 kg/m  General: Well Developed, well nourished, and in no acute distress.   MSK:  Feet bilaterally significant pronation with standing. Right foot tender palpation navicular prominence.  Normal foot and ankle motion.  Normal strength.  Left foot tender palpation navicular prominence.  Normal foot and ankle motion.  Normal strength.    Lab and Radiology Results  Diagnostic Limited MSK Ultrasound of: Bilateral midfoot posterior tibialis tendon insertion Normal-appearing tendon medial malleolus bilaterally however near the plantar insertion site and navicular prominence some hypoechoic change surrounds  tendon sheath consistent with insertional tendinitis.  This is present bilaterally. Impression: Insertional posterior tibialis tendinitis bilaterally    Assessment and Plan: 44 y.o. male with insertional posterior tibialis tendinitis bilaterally.  Most of the issues have improved significantly.  We will plan to continue eccentric exercises but add nitroglycerin patch protocol.  Recommend against barefoot activity without good arch support.  Recheck back in about 6 weeks.    Orders Placed This Encounter  Procedures  . [place] LIMITED JOINT SPACE STRUCTURES LOW BILAT(NO LINKED CHARGES)    Order Specific Question:   Reason for Exam (SYMPTOM  OR DIAGNOSIS REQUIRED)    Answer:   B foot pain    Order Specific Question:   Preferred imaging location?    Answer:   [facility] ordered this encounter  Medications  . nitroGLYCERIN (NITRODUR - DOSED IN MG/24 HR) 0.2 mg/hr patch    Sig: Apply 1/4 patch daily to tendon for tendonitis.    Dispense:  30 patch    Refill:  1     Discussed warning signs or symptoms. Please see discharge instructions. Patient expresses understanding.   The above documentation has been reviewed and is accurate and complete Clementeen Graham, M.D.

## 2020-04-08 ENCOUNTER — Encounter: Payer: Self-pay | Admitting: Adult Health

## 2020-04-09 ENCOUNTER — Ambulatory Visit: Payer: BC Managed Care – PPO

## 2020-04-09 ENCOUNTER — Other Ambulatory Visit: Payer: Self-pay

## 2020-04-09 DIAGNOSIS — M79671 Pain in right foot: Secondary | ICD-10-CM | POA: Diagnosis not present

## 2020-04-09 DIAGNOSIS — R252 Cramp and spasm: Secondary | ICD-10-CM | POA: Diagnosis not present

## 2020-04-09 DIAGNOSIS — M546 Pain in thoracic spine: Secondary | ICD-10-CM | POA: Diagnosis not present

## 2020-04-09 DIAGNOSIS — M79672 Pain in left foot: Secondary | ICD-10-CM

## 2020-04-09 DIAGNOSIS — M542 Cervicalgia: Secondary | ICD-10-CM

## 2020-04-09 NOTE — Therapy (Signed)
[facility] [address] [place], [place], [address] Phone: [phone]   Fax:  [phone]  Physical Therapy Treatment  Patient Details  Name: Alan Brooks MRN: [id number] Date of Birth: 1976-03-19 Referring Provider (PT): Lynne Leader, MD   Encounter Date: 04/09/2020   PT End of Session - 04/09/20 0918    Visit Number 16    Date for PT Re-Evaluation 04/25/20    Authorization Type BCBS    PT Start Time 0847    PT Stop Time 0911    PT Time Calculation (min) 24 min    Activity Tolerance Patient tolerated treatment well;No increased pain    Behavior During Therapy WFL for tasks assessed/performed           Past Medical History:  Diagnosis Date  . Allergy   . Anxiety   . Asthma     History reviewed. No pertinent surgical history.  There were no vitals filed for this visit.   Subjective Assessment - 04/09/20 0851    Subjective I saw the MD last week.  I am using nitro patches now and considering PRP injections in the future. My neck is 90% better now.  No change in my feet overall.    Currently in Pain? Yes    Pain Score 1     Pain Location Neck    Pain Orientation Left;Right    Pain Descriptors / Indicators Tightness;Aching    Pain Type Chronic pain    Pain Onset More than a month ago    Pain Frequency Intermittent    Aggravating Factors  turning head    Pain Relieving Factors it isn't bothering me enough to do anything    Pain Score 2   up to 8/10 with standing and walking   Pain Location Foot    Pain Orientation Left;Right    Pain Descriptors / Indicators Aching;Shooting    Pain Type Chronic pain    Pain Onset More than a month ago    Pain Frequency Constant    Aggravating Factors  morning and evening, standing/walking    Pain Relieving Factors wearing shoes with orthotics              [facility] PT Assessment - 04/09/20 0001      Assessment   Medical Diagnosis cervical paraspinal muscle  spasm, posterior tibialis tendinitis of bil LEs    Referring Provider (PT) Lynne Leader, MD    Onset Date/Surgical Date 04/22/19    Hand Dominance Right      Prior Function   Level of Independence Independent      Cognition   Overall Cognitive Status Within Functional Limits for tasks assessed      Observation/Other Assessments   Focus on Therapeutic Outcomes (FOTO)  29% limitation (neck)      AROM   Cervical - Right Side Bend 50    Cervical - Left Side Bend 50    Cervical - Right Rotation 60    Cervical - Left Rotation 55                         [facility] Adult PT Treatment/Exercise - 04/09/20 0001      Neck Exercises: Seated   Other Seated Exercise cervical A/ROM 3 ways; 3x20 seconds each.  PT provided tactile and demo cues for alignment.                  PT Education - 04/09/20  [place]    Education Details review of all HEP using visuals    Person(s) Educated Patient    Methods Explanation    Comprehension Verbalized understanding            PT Short Term Goals - 04/09/20 0853      PT SHORT TERM GOAL #3   Title report a 25% reduction in neck pain with turning head    Status Achieved             PT Long Term Goals - 04/09/20 0854      PT LONG TERM GOAL #1   Title be independent in advanced HEP to include cervical and foot exercises    Status Achieved      PT LONG TERM GOAL #2   Title reduce FOTO (neck) to < or = to 28% limitation    Baseline 29%    Status Partially Met      PT LONG TERM GOAL #3   Title reduce FOTO to < or = to 40% limitation (hip)    Status Achieved      PT LONG TERM GOAL #4   Title report a 60% reduction in Lt LE pain with sitting, standing and walking    Status Achieved      PT LONG TERM GOAL #5   Title Pt will have greater than 55 deg of Lt and Rt great toe extension which will improve his mechanics with walking.    Status Not Met      PT LONG TERM GOAL #6   Title demonstrate cervical A/ROM rotation to > or  = to 65  degrees to improve safety with driving    Status Partially Met      PT LONG TERM GOAL #7   Title report a 70% reduction in neck pain with turning head with driving    Baseline 32%    Status Achieved                 Plan - 04/09/20 0916    Clinical Impression Statement Pt reports 90% overall improvement in neck pain since the start of care.  No change in foot pain.  Pt will D/C today to HEP for both regions and continue to follow-up with MD as needed.  See objective measures in objective section.    PT Next Visit Plan D/C PT to HEP    PT Home Exercise Plan [place] and Agree with Plan of Care Patient           Patient will benefit from skilled therapeutic intervention in order to improve the following deficits and impairments:     Visit Diagnosis: Cramp and spasm  Pain in thoracic spine  Pain in right foot  Pain in left foot  Cervicalgia     Problem List Patient Active Problem List   Diagnosis Date Noted  . Posterior tibialis tendinitis of both lower extremities 04/04/2020  . Psoriasis 01/24/2020  . Thoracic disc herniation 06/11/2018  . Genital warts 10/10/2017  . Rosacea, acne 09/27/2014  . Low back pain 09/27/2014  . Carpal tunnel syndrome of left wrist 01/08/2012  . Anxiety state 05/19/2008  . Asthma 05/19/2008   PHYSICAL THERAPY DISCHARGE SUMMARY  Visits from Start of Care: 16  Current functional level related to goals / functional outcomes: See above for current PT status.    Remaining deficits: See above for current status.  Pt reports 90% improvement in neck pain.  No change in  foot pain.     Education / Equipment: HEP Plan: Patient agrees to discharge.  Patient goals were partially met. Patient is being discharged due to being pleased with the current functional level.  ?????        Sigurd Sos, PT 04/09/20 9:19 AM  [facility] [address] [place]  [place], [place], [address] Phone: [phone]   Fax:  [phone]  Name: Alan Brooks MRN: [id number] Date of Birth: 25-Aug-1976

## 2020-04-11 ENCOUNTER — Ambulatory Visit: Payer: BC Managed Care – PPO | Admitting: Physical Therapy

## 2020-04-16 ENCOUNTER — Other Ambulatory Visit: Payer: Self-pay | Admitting: Adult Health

## 2020-04-16 MED FILL — MELOXICAM 15 MG TABLET: 15 | 30 days supply | Qty: 30 | Fill #2

## 2020-04-16 MED FILL — ADVAIR 250/50 DISKUS: 250-50 | 30 days supply | Qty: 60 | Fill #1

## 2020-04-17 MED FILL — BREO ELLIPTA 200-25 MCG INH: 200-25 | 30 days supply | Qty: 60 | Fill #0

## 2020-04-25 ENCOUNTER — Ambulatory Visit: Payer: BC Managed Care – PPO

## 2020-05-02 MED FILL — NITROGLYCERIN 0.2 MG/HR PTC: 0.2 | 28 days supply | Qty: 7 | Fill #1

## 2020-05-14 ENCOUNTER — Other Ambulatory Visit (HOSPITAL_COMMUNITY): Payer: Self-pay | Admitting: Physician Assistant

## 2020-05-14 MED FILL — FLUOCINONIDE 0.05% SOLUTION: 0.05 | 20 days supply | Qty: 60 | Fill #2

## 2020-05-14 MED FILL — CLOBETASOL PROPIONATE 0.05: 0.05 | 30 days supply | Qty: 60 | Fill #1

## 2020-05-14 MED FILL — MELOXICAM 15 MG TABLET: 15 | 30 days supply | Qty: 30 | Fill #0

## 2020-05-14 MED FILL — PANTOPRAZOLE SOD DR 40 MG T: 40 | 90 days supply | Qty: 90 | Fill #1

## 2020-05-14 MED FILL — CITALOPRAM HBR 40 MG TABLET: 40 | 90 days supply | Qty: 90 | Fill #1

## 2020-05-16 ENCOUNTER — Encounter: Payer: Self-pay | Admitting: Family Medicine

## 2020-05-16 ENCOUNTER — Ambulatory Visit: Payer: BC Managed Care – PPO | Admitting: Family Medicine

## 2020-05-16 ENCOUNTER — Other Ambulatory Visit: Payer: Self-pay

## 2020-05-16 VITALS — BP 110/78 | HR 82 | Ht 74.5 in | Wt 194.0 lb

## 2020-05-16 DIAGNOSIS — M2550 Pain in unspecified joint: Secondary | ICD-10-CM

## 2020-05-16 NOTE — Patient Instructions (Signed)
Thank you for coming in today.  I think this is rheumatoid.  I recommend checking back with rheumatology.  Make sure they send the notes to me and your PCP [facility].  I am happy to see you again as needed for this or other issues.   Continue exercise.

## 2020-05-16 NOTE — Progress Notes (Signed)
   I, Wendy Poet, LAT, ATC, am serving as scribe for Dr. Lynne Leader.  Alan Brooks is a 44 y.o. male who presents to [place] at [facility] today for f/u of neck, back and B foot pain , R>L.  He was last seen by Dr. Georgina Snell on 04/04/20 mainly for his feet and was prescribed nitroglycerin patches and advised to purchase/use arch supports in his shoes.  He has completed a lengthy course of PT.  Since his last visit, pt reports that his feet con't to hurt, R>L.  He has been sparingly using the nitroglycerin patches and doesn't feel that they have helped w/ his symptoms.  He has developed some new L knee and R thumb pain which makes him think that his issue may be more systemic.  Was referred to [facility] in June. He notes that they are suspicious that he has psoriatic arthritis. They did offer disease modifying antirheumatic's however he did not start them as his disease and symptoms are pretty mild at the time. He is considering them at this point. Meloxicam has not been helpful.  Diagnostic testing: Pelvis XR- 12/12/19; R and L foot XR- 06/06/19  Pertinent review of systems: No fevers or chills  Relevant historical information: Psoriasis, asthma   Exam:  BP 110/78 (BP Location: Right Arm, Patient Position: Sitting, Cuff Size: Normal)   Pulse 82   Ht 6' 2.5" (1.892 m)   Wt 194 lb (88 kg)   SpO2 97%   BMI 24.57 kg/m  General: Well Developed, well nourished, and in no acute distress.   MSK: Normal gait    Lab and Radiology Results  Component     Latest Ref Rng & Units 01/06/2020 01/24/2020  ANA Titer 1     titer  1:40 (H)  ANA Pattern 1       Cytoplasmic (A)  TSH     0.35 - 4.50 uIU/mL 1.56   RPR     NON-REACTI NON-REACTIVE   Compl, Total (CH50)     31 - 60 U/mL  >60 (H)  HLA-B27 Antigen     NEGATIVE  NEGATIVE  RA Latex Turbid.     <14 IU/mL  <14  Anti Nuclear Antibody (ANA)     NEGATIVE  POSITIVE (A)  Sed Rate     0 - 15  mm/hr  7  CK Total     7.0 - 232.0 U/L  100   Labs reviewed with patient in clinic    Assessment and Plan: 44 y.o. male with polyarthralgia. Concerning for psoriatic arthritis. Symptoms are clearly pretty mild at this point however they are persistent and worsening. I think it is reasonable to check back with rheumatology to see if it is reasonable to start disease modifying antirheumatic's or monoclonal antibody therapy.  Certainly happy to proceed with discrete joint treatment such as injections etc. in the future if needed however I am concerned this is more of a systemic issue and probably should be treated in a systemic manner.    Discussed warning signs or symptoms. Please see discharge instructions. Patient expresses understanding.   The above documentation has been reviewed and is accurate and complete Lynne Leader, M.D.  Total encounter time 20 minutes including face-to-face time with the patient and, reviewing past medical record, and charting on the date of service.

## 2020-05-23 DIAGNOSIS — L401 Generalized pustular psoriasis: Secondary | ICD-10-CM | POA: Diagnosis not present

## 2020-05-23 DIAGNOSIS — R768 Other specified abnormal immunological findings in serum: Secondary | ICD-10-CM | POA: Diagnosis not present

## 2020-05-23 DIAGNOSIS — M2550 Pain in unspecified joint: Secondary | ICD-10-CM | POA: Diagnosis not present

## 2020-05-23 DIAGNOSIS — L4059 Other psoriatic arthropathy: Secondary | ICD-10-CM | POA: Diagnosis not present

## 2020-06-01 ENCOUNTER — Other Ambulatory Visit: Payer: Self-pay | Admitting: Adult Health

## 2020-06-01 DIAGNOSIS — K625 Hemorrhage of anus and rectum: Secondary | ICD-10-CM

## 2020-06-01 MED FILL — NITROGLYCERIN 0.2 MG/HR PTC: 0.2 | 28 days supply | Qty: 7 | Fill #2

## 2020-06-03 ENCOUNTER — Other Ambulatory Visit: Payer: Self-pay | Admitting: Adult Health

## 2020-06-04 MED FILL — HYDROCORTISONE AC 25 MG SUP: 25 | 6 days supply | Qty: 12 | Fill #0

## 2020-06-14 MED FILL — MELOXICAM 15 MG TABLET: 15 | 30 days supply | Qty: 30 | Fill #1

## 2020-07-24 DIAGNOSIS — M2550 Pain in unspecified joint: Secondary | ICD-10-CM | POA: Diagnosis not present

## 2020-07-24 DIAGNOSIS — L4059 Other psoriatic arthropathy: Secondary | ICD-10-CM | POA: Diagnosis not present

## 2020-07-24 DIAGNOSIS — L401 Generalized pustular psoriasis: Secondary | ICD-10-CM | POA: Diagnosis not present

## 2020-07-24 DIAGNOSIS — R768 Other specified abnormal immunological findings in serum: Secondary | ICD-10-CM | POA: Diagnosis not present

## 2020-07-24 MED FILL — FLUOCINONIDE 0.05% SOLUTION: 0.05 | 20 days supply | Qty: 60 | Fill #3

## 2020-07-24 MED FILL — MELOXICAM 15 MG TABLET: 15 | 30 days supply | Qty: 30 | Fill #2

## 2020-08-08 DIAGNOSIS — R5382 Chronic fatigue, unspecified: Secondary | ICD-10-CM | POA: Diagnosis not present

## 2020-08-08 DIAGNOSIS — R768 Other specified abnormal immunological findings in serum: Secondary | ICD-10-CM | POA: Diagnosis not present

## 2020-08-08 DIAGNOSIS — M2550 Pain in unspecified joint: Secondary | ICD-10-CM | POA: Diagnosis not present

## 2020-08-08 DIAGNOSIS — L4059 Other psoriatic arthropathy: Secondary | ICD-10-CM | POA: Diagnosis not present

## 2020-08-08 DIAGNOSIS — L401 Generalized pustular psoriasis: Secondary | ICD-10-CM | POA: Diagnosis not present

## 2020-08-13 ENCOUNTER — Other Ambulatory Visit: Payer: Self-pay | Admitting: Adult Health

## 2020-08-13 MED FILL — PANTOPRAZOLE SOD DR 40 MG T: 40 | 90 days supply | Qty: 90 | Fill #2

## 2020-08-13 MED FILL — CITALOPRAM HBR 40 MG TABLET: 40 | 90 days supply | Qty: 90 | Fill #0

## 2020-08-13 MED FILL — ADVAIR 250/50 DISKUS: 250-50 | 30 days supply | Qty: 60 | Fill #2

## 2020-08-15 ENCOUNTER — Other Ambulatory Visit (HOSPITAL_COMMUNITY): Payer: Self-pay | Admitting: Physician Assistant

## 2020-08-15 MED FILL — FOLIC ACID 1 MG TABS: 1 | 30 days supply | Qty: 30 | Fill #0

## 2020-08-15 MED FILL — METHOTREXATE SODIUM 2.5 MG: 2.5 | 28 days supply | Qty: 24 | Fill #0

## 2020-09-07 MED FILL — METHOTREXATE SODIUM 2.5 MG: 2.5 | 28 days supply | Qty: 24 | Fill #1

## 2020-09-07 MED FILL — FOLIC ACID 1 MG TABS: 1 | 30 days supply | Qty: 30 | Fill #1

## 2020-09-07 MED FILL — ADVAIR 250/50 DISKUS: 250-50 | 30 days supply | Qty: 60 | Fill #3

## 2020-10-11 MED FILL — ADVAIR 250/50 DISKUS: 250-50 | 30 days supply | Qty: 60 | Fill #4

## 2020-10-11 MED FILL — METHOTREXATE SODIUM 2.5 MG: 2.5 | 28 days supply | Qty: 24 | Fill #2

## 2020-10-11 MED FILL — FOLIC ACID 1 MG TABS: 1 | 30 days supply | Qty: 30 | Fill #2

## 2020-10-17 ENCOUNTER — Other Ambulatory Visit (HOSPITAL_COMMUNITY): Payer: Self-pay | Admitting: Physician Assistant

## 2020-10-17 DIAGNOSIS — L4059 Other psoriatic arthropathy: Secondary | ICD-10-CM | POA: Diagnosis not present

## 2020-10-17 DIAGNOSIS — M2550 Pain in unspecified joint: Secondary | ICD-10-CM | POA: Diagnosis not present

## 2020-10-17 DIAGNOSIS — R768 Other specified abnormal immunological findings in serum: Secondary | ICD-10-CM | POA: Diagnosis not present

## 2020-10-17 DIAGNOSIS — L401 Generalized pustular psoriasis: Secondary | ICD-10-CM | POA: Diagnosis not present

## 2020-10-27 MED FILL — METHOTREXATE SODIUM 2.5 MG: 2.5 | 28 days supply | Qty: 32 | Fill #0

## 2020-11-12 MED FILL — PANTOPRAZOLE SOD DR 40 MG T: 40 | 90 days supply | Qty: 90 | Fill #3

## 2020-11-12 MED FILL — CITALOPRAM HBR 40 MG TABLET: 40 | 90 days supply | Qty: 90 | Fill #1

## 2020-11-12 MED FILL — FLUOCINONIDE 0.05% SOLUTION: 0.05 | 20 days supply | Qty: 60 | Fill #4

## 2020-11-12 MED FILL — FOLIC ACID 1 MG TABS: 1 | 30 days supply | Qty: 30 | Fill #3

## 2020-11-16 ENCOUNTER — Other Ambulatory Visit (HOSPITAL_BASED_OUTPATIENT_CLINIC_OR_DEPARTMENT_OTHER): Payer: Self-pay

## 2020-12-03 ENCOUNTER — Other Ambulatory Visit: Payer: Self-pay | Admitting: Adult Health

## 2020-12-03 ENCOUNTER — Other Ambulatory Visit (HOSPITAL_COMMUNITY): Payer: Self-pay

## 2020-12-03 MED FILL — Methotrexate Sodium Tab 2.5 MG (Base Equiv): ORAL | 28 days supply | Qty: 32 | Fill #0 | Status: AC

## 2020-12-06 ENCOUNTER — Other Ambulatory Visit (HOSPITAL_COMMUNITY): Payer: Self-pay

## 2020-12-06 MED ORDER — FOLIC ACID 1 MG PO TABS
ORAL_TABLET | Freq: Every day | ORAL | 0 refills | Status: DC
  Filled 2020-12-06: qty 30, 30d supply, fill #0

## 2020-12-18 DIAGNOSIS — R768 Other specified abnormal immunological findings in serum: Secondary | ICD-10-CM | POA: Diagnosis not present

## 2020-12-18 DIAGNOSIS — M2550 Pain in unspecified joint: Secondary | ICD-10-CM | POA: Diagnosis not present

## 2020-12-18 DIAGNOSIS — L401 Generalized pustular psoriasis: Secondary | ICD-10-CM | POA: Diagnosis not present

## 2020-12-18 DIAGNOSIS — L4059 Other psoriatic arthropathy: Secondary | ICD-10-CM | POA: Diagnosis not present

## 2020-12-24 ENCOUNTER — Other Ambulatory Visit: Payer: Self-pay

## 2020-12-24 ENCOUNTER — Other Ambulatory Visit (HOSPITAL_COMMUNITY): Payer: Self-pay

## 2020-12-24 ENCOUNTER — Other Ambulatory Visit: Payer: Self-pay | Admitting: Adult Health

## 2020-12-24 MED FILL — Methotrexate Sodium Tab 2.5 MG (Base Equiv): ORAL | 28 days supply | Qty: 32 | Fill #1 | Status: AC

## 2020-12-25 ENCOUNTER — Other Ambulatory Visit (HOSPITAL_COMMUNITY): Payer: Self-pay

## 2020-12-25 MED ORDER — FOLIC ACID 1 MG PO TABS
ORAL_TABLET | ORAL | 5 refills | Status: DC
  Filled 2020-12-25: qty 30, 30d supply, fill #0
  Filled 2021-02-12: qty 30, 30d supply, fill #1
  Filled 2021-03-15: qty 30, 30d supply, fill #2

## 2020-12-27 ENCOUNTER — Other Ambulatory Visit (HOSPITAL_COMMUNITY): Payer: Self-pay

## 2021-01-05 ENCOUNTER — Other Ambulatory Visit (HOSPITAL_COMMUNITY): Payer: Self-pay

## 2021-01-24 ENCOUNTER — Other Ambulatory Visit: Payer: Self-pay | Admitting: Physician Assistant

## 2021-01-24 ENCOUNTER — Other Ambulatory Visit (HOSPITAL_COMMUNITY): Payer: Self-pay

## 2021-01-25 ENCOUNTER — Other Ambulatory Visit (HOSPITAL_COMMUNITY): Payer: Self-pay

## 2021-01-28 ENCOUNTER — Other Ambulatory Visit (HOSPITAL_COMMUNITY): Payer: Self-pay

## 2021-01-28 ENCOUNTER — Other Ambulatory Visit: Payer: Self-pay | Admitting: Physician Assistant

## 2021-01-28 MED ORDER — METHOTREXATE 2.5 MG PO TABS
ORAL_TABLET | ORAL | 1 refills | Status: DC
  Filled 2021-01-28: qty 32, 28d supply, fill #0
  Filled 2021-02-18: qty 32, 28d supply, fill #1

## 2021-02-12 ENCOUNTER — Other Ambulatory Visit (HOSPITAL_COMMUNITY): Payer: Self-pay

## 2021-02-12 ENCOUNTER — Other Ambulatory Visit: Payer: Self-pay | Admitting: Adult Health

## 2021-02-12 DIAGNOSIS — Z0000 Encounter for general adult medical examination without abnormal findings: Secondary | ICD-10-CM

## 2021-02-13 ENCOUNTER — Other Ambulatory Visit (HOSPITAL_COMMUNITY): Payer: Self-pay

## 2021-02-13 MED ORDER — CITALOPRAM HYDROBROMIDE 40 MG PO TABS
ORAL_TABLET | Freq: Every day | ORAL | 1 refills | Status: DC
  Filled 2021-02-13: qty 90, 90d supply, fill #0
  Filled 2021-05-20: qty 90, 90d supply, fill #1

## 2021-02-13 MED ORDER — PANTOPRAZOLE SODIUM 40 MG PO TBEC
DELAYED_RELEASE_TABLET | Freq: Every day | ORAL | 3 refills | Status: DC
  Filled 2021-02-13: qty 90, 90d supply, fill #0
  Filled 2021-05-20: qty 90, 90d supply, fill #1
  Filled 2021-08-26: qty 90, 90d supply, fill #2
  Filled 2021-11-28: qty 90, 90d supply, fill #3

## 2021-02-14 ENCOUNTER — Other Ambulatory Visit (HOSPITAL_COMMUNITY): Payer: Self-pay

## 2021-02-18 ENCOUNTER — Other Ambulatory Visit (HOSPITAL_COMMUNITY): Payer: Self-pay

## 2021-03-04 ENCOUNTER — Telehealth: Payer: BC Managed Care – PPO | Admitting: Family

## 2021-03-04 DIAGNOSIS — N4889 Other specified disorders of penis: Secondary | ICD-10-CM

## 2021-03-04 MED ORDER — DICLOFENAC SODIUM 75 MG PO TBEC
75.0000 mg | DELAYED_RELEASE_TABLET | Freq: Two times a day (BID) | ORAL | 0 refills | Status: DC
  Filled 2021-03-04: qty 60, 30d supply, fill #0

## 2021-03-04 NOTE — Progress Notes (Signed)
Virtual Visit Consent   Alan Brooks, you are scheduled for a virtual visit with a [facility] provider today.     Just as with appointments in the office, your consent must be obtained to participate.  Your consent will be active for this visit and any virtual visit you may have with one of our providers in the next 365 days.     If you have a MyChart account, a copy of this consent can be sent to you electronically.  All virtual visits are billed to your insurance company just like a traditional visit in the office.    As this is a virtual visit, video technology does not allow for your provider to perform a traditional examination.  This may limit your provider's ability to fully assess your condition.  If your provider identifies any concerns that need to be evaluated in person or the need to arrange testing (such as labs, EKG, etc.), we will make arrangements to do so.     Although advances in technology are sophisticated, we cannot ensure that it will always work on either your end or our end.  If the connection with a video visit is poor, the visit may have to be switched to a telephone visit.  With either a video or telephone visit, we are not always able to ensure that we have a secure connection.     I need to obtain your verbal consent now.   Are you willing to proceed with your visit today?    Alan Brooks has provided verbal consent on 03/04/2021 for a virtual visit (video or telephone).   Alan Rodney, FNP   Date: 03/04/2021 6:25 PM   Virtual Visit via Video Note   I, Alan Brooks, connected with  Alan Brooks  ([id number], May 06, 1976) on 03/04/21 at  6:30 PM EDT by a video-enabled telemedicine application and verified that I am speaking with the correct person using two identifiers.  Location: Patient: Virtual Visit Location Patient: Home Provider: Virtual Visit Location Provider: Home   I discussed the limitations of evaluation and management by  telemedicine and the availability of in person appointments. The patient expressed understanding and agreed to proceed.    History of Present Illness: Alan Brooks is a 45 y.o. who identifies as a male who was assigned male at birth, and is being seen today for penis pain that started a few weeks. He reports several weeks ago he got an erection and started having pain in the base of his penis. He reports he has intermittent pain in the base of his penis and if he gets an erection he has pain from the base to shaft that 5 out 10.   Denies any injury, rash. Reports the same partner for the last 6 months. No discharge. No bruising or swelling present. Denies any fever or warmth to the area.   HPI: HPI  Problems:  Patient Active Problem List   Diagnosis Date Noted   Posterior tibialis tendinitis of both lower extremities 04/04/2020   Psoriasis 01/24/2020   Thoracic disc herniation 06/11/2018   Genital warts 10/10/2017   Rosacea, acne 09/27/2014   Low back pain 09/27/2014   Carpal tunnel syndrome of left wrist 01/08/2012   Anxiety state 05/19/2008   Asthma 05/19/2008    Allergies: No Known Allergies Medications:  Current Outpatient Medications:    diclofenac (VOLTAREN) 75 MG EC tablet, Take 1 tablet (75 mg total) by mouth 2 (two) times daily., Disp: 60  tablet, Rfl: 0   BREO ELLIPTA 200-25 MCG/INH AEPB, INHALE 1 PUFF INTO THE LUNGS DAILY., Disp: 60 each, Rfl: 4   citalopram (CELEXA) 40 MG tablet, TAKE 1 TABLET BY MOUTH ONCE A DAY, Disp: 90 tablet, Rfl: 1   clobetasol ointment (TEMOVATE) 0.05 %, Apply 1 application topically 2 (two) times daily. To rash on body, Disp: 60 g, Rfl: 4   Fluticasone-Salmeterol (ADVAIR) 250-50 MCG/DOSE AEPB, INHALE 1 PUFF INTO THE LUNGS 2 TIMES DAILY., Disp: 60 each, Rfl: 11   folic acid (FOLVITE) 1 MG tablet, TAKE 1 TABLET BY MOUTH ONCE A DAY, Disp: 30 tablet, Rfl: 0   folic acid (FOLVITE) 1 MG tablet, TAKE 1 TABLET BY MOUTH ONCE DAILY, Disp: 30 tablet, Rfl:  5   hydrocortisone (ANUSOL-HC) 25 MG suppository, UNWRAP AND INSERT 1 SUPPOSITORY RECTALLY 2 TIMES DAILY AS NEEDED FOR HEMORRHOIDS OR ANAL ITCHING., Disp: 12 suppository, Rfl: 3   hydrocortisone cream 1 %, Apply 1 application topically 2 (two) times daily., Disp: 30 g, Rfl: 0   meloxicam (MOBIC) 7.5 MG tablet, Take 7.5 mg by mouth daily., Disp: , Rfl:    methotrexate (RHEUMATREX) 2.5 MG tablet, Take 8  tablets by mouth once a week on the same day, Disp: 32 tablet, Rfl: 1   methotrexate 2.5 MG tablet, TAKE 8 TABLETS BY MOUTH ONCE A WEEK ON THE SAME DAY, Disp: 32 tablet, Rfl: 2   methotrexate 2.5 MG tablet, TAKE 6 TABLETS BY MOUTH ONCE A WEEK ON THE SAME DAY, Disp: 24 tablet, Rfl: 2   nitroGLYCERIN (NITRODUR - DOSED IN MG/24 HR) 0.2 mg/hr patch, APPLY 1/4 PATCH TOPICALLY TO TENDON FOR TENDONITIS, Disp: 30 patch, Rfl: 1   pantoprazole (PROTONIX) 40 MG tablet, TAKE 1 TABLET BY MOUTH ONCE DAILY, Disp: 90 tablet, Rfl: 3   VENTOLIN HFA 108 (90 Base) MCG/ACT inhaler, INHALE 2 PUFFS BY MOUTH INTO THE LUNGS EVERY 6 HOURS AS NEEDED FOR WHEEZING OR SHORTNESS OF BREATH, Disp: 18 g, Rfl: 2  Observations/Objective: Patient is well-developed, well-nourished in no acute distress.  Resting comfortably  at home.  Head is normocephalic, atraumatic.  No labored breathing.  Speech is clear and coherent with logical content.  Patient is alert and oriented at baseline.    Assessment and Plan: 1. Penis pain - diclofenac (VOLTAREN) 75 MG EC tablet; Take 1 tablet (75 mg total) by mouth 2 (two) times daily.  Dispense: 60 tablet; Refill: 0 Ligament pain? No s/s of infection and pain with movement Ice No other NSAID's while taking diclofenac  Avoid erection for next few weeks.  If pain continues and does not improve, needs to be seen   Follow Up Instructions: I discussed the assessment and treatment plan with the patient. The patient was provided an opportunity to ask questions and all were answered. The patient  agreed with the plan and demonstrated an understanding of the instructions.  A copy of instructions were sent to the patient via MyChart.  The patient was advised to call back or seek an in-person evaluation if the symptoms worsen or if the condition fails to improve as anticipated.  Time:  I spent 16 minutes with the patient via telehealth technology discussing the above problems/concerns.    Alan Rodney, FNP

## 2021-03-05 ENCOUNTER — Other Ambulatory Visit (HOSPITAL_COMMUNITY): Payer: Self-pay

## 2021-03-15 ENCOUNTER — Other Ambulatory Visit (HOSPITAL_COMMUNITY): Payer: Self-pay

## 2021-03-15 MED ORDER — METHOTREXATE 2.5 MG PO TABS
ORAL_TABLET | ORAL | 0 refills | Status: DC
  Filled 2021-03-15: qty 32, 28d supply, fill #0

## 2021-03-18 ENCOUNTER — Other Ambulatory Visit (HOSPITAL_COMMUNITY): Payer: Self-pay

## 2021-03-19 ENCOUNTER — Other Ambulatory Visit (HOSPITAL_COMMUNITY): Payer: Self-pay

## 2021-03-19 MED ORDER — FLUTICASONE-SALMETEROL 250-50 MCG/ACT IN AEPB
INHALATION_SPRAY | RESPIRATORY_TRACT | 6 refills | Status: DC
  Filled 2021-03-19: qty 60, 30d supply, fill #0

## 2021-03-20 ENCOUNTER — Other Ambulatory Visit (HOSPITAL_COMMUNITY): Payer: Self-pay

## 2021-03-21 DIAGNOSIS — R768 Other specified abnormal immunological findings in serum: Secondary | ICD-10-CM | POA: Diagnosis not present

## 2021-03-21 DIAGNOSIS — L4059 Other psoriatic arthropathy: Secondary | ICD-10-CM | POA: Diagnosis not present

## 2021-03-21 DIAGNOSIS — M2550 Pain in unspecified joint: Secondary | ICD-10-CM | POA: Diagnosis not present

## 2021-03-21 DIAGNOSIS — L401 Generalized pustular psoriasis: Secondary | ICD-10-CM | POA: Diagnosis not present

## 2021-05-20 ENCOUNTER — Other Ambulatory Visit: Payer: Self-pay | Admitting: Adult Health

## 2021-05-20 ENCOUNTER — Other Ambulatory Visit (HOSPITAL_COMMUNITY): Payer: Self-pay

## 2021-05-21 ENCOUNTER — Other Ambulatory Visit (HOSPITAL_COMMUNITY): Payer: Self-pay

## 2021-05-23 ENCOUNTER — Other Ambulatory Visit: Payer: Self-pay

## 2021-05-24 ENCOUNTER — Telehealth: Payer: Self-pay | Admitting: Adult Health

## 2021-05-24 ENCOUNTER — Other Ambulatory Visit: Payer: Self-pay | Admitting: Adult Health

## 2021-05-24 ENCOUNTER — Other Ambulatory Visit (HOSPITAL_COMMUNITY): Payer: Self-pay

## 2021-05-24 ENCOUNTER — Encounter: Payer: Self-pay | Admitting: Adult Health

## 2021-05-24 MED ORDER — FLUTICASONE-SALMETEROL 250-50 MCG/ACT IN AEPB
INHALATION_SPRAY | RESPIRATORY_TRACT | 6 refills | Status: AC
  Filled 2021-05-24: qty 60, 30d supply, fill #0
  Filled 2021-07-29: qty 60, 30d supply, fill #1
  Filled 2021-08-26: qty 60, 30d supply, fill #2
  Filled 2021-09-30: qty 60, 30d supply, fill #3
  Filled 2021-11-28: qty 60, 30d supply, fill #4
  Filled 2022-03-07: qty 60, 30d supply, fill #5

## 2021-05-24 NOTE — Telephone Encounter (Signed)
Medication has been refilled electronically.

## 2021-05-24 NOTE — Telephone Encounter (Signed)
Patient needs a refill on Advair sent in.  He had to r/s his appointment due to the inclement weather.  He will be out after today.

## 2021-05-27 ENCOUNTER — Other Ambulatory Visit (HOSPITAL_COMMUNITY): Payer: Self-pay

## 2021-05-29 ENCOUNTER — Encounter: Payer: Self-pay | Admitting: Adult Health

## 2021-05-29 NOTE — Progress Notes (Deleted)
Subjective:    Patient ID: Alan Brooks, male    DOB: 10/25/1975, 45 y.o.   MRN: [id number]  HPI Patient presents for yearly preventative medicine examination. He is a pleasant 45 year old male who  has a past medical history of Allergy, Anxiety, and Asthma.  Asthma-controlled with Advair.  He does have a rescue inhaler that he uses infrequently  Anxiety-uses Celexa 40 mg.  Feels as though his symptoms are well controlled  Polyarticular psoriatic arthritis-managed by rheumatology.  Currently prescribed methotrexate 20 mg weekly and Voltaren 75 mg twice daily as needed.  All immunizations and health maintenance protocols were reviewed with the patient and needed orders were placed.  Appropriate screening laboratory values were ordered for the patient including screening of hyperlipidemia, renal function and hepatic function. If indicated by BPH, a PSA was ordered.  Medication reconciliation,  past medical history, social history, problem list and allergies were reviewed in detail with the patient  Goals were established with regard to weight loss, exercise, and  diet in compliance with medications  Review of Systems  Constitutional: Negative.   HENT: Negative.    Eyes: Negative.   Respiratory: Negative.    Cardiovascular: Negative.   Gastrointestinal: Negative.   Endocrine: Negative.   Genitourinary: Negative.   Musculoskeletal:  Positive for arthralgias.  Skin: Negative.   Allergic/Immunologic: Negative.   Neurological: Negative.   Hematological: Negative.   Psychiatric/Behavioral: Negative.    All other systems reviewed and are negative.  Past Medical History:  Diagnosis Date   Allergy    Anxiety    Asthma     Social History   Socioeconomic History   Marital status: Single    Spouse name: Not on file   Number of children: Not on file   Years of education: Not on file   Highest education level: Not on file  Occupational History   Not on file  Tobacco  Use   Smoking status: Former    Packs/day: 1.00    Years: 15.00    Pack years: 15.00    Types: Cigarettes   Smokeless tobacco: Never  Substance and Sexual Activity   Alcohol use: No   Drug use: No   Sexual activity: Not on file  Other Topics Concern   Not on file  Social History Narrative   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  Family History  Problem Relation Age of Onset   Heart attack Maternal Grandfather    Heart attack Paternal Grandfather    Asthma Brother    Depression Other        entire family    Anxiety disorder Other        entire family     No Known Allergies  Current Outpatient Medications on File Prior to Visit  Medication Sig Dispense Refill   BREO ELLIPTA 200-25 MCG/INH AEPB INHALE 1 PUFF INTO THE LUNGS DAILY. 60 each 4   citalopram (CELEXA) 40 MG tablet TAKE 1 TABLET BY MOUTH ONCE A DAY 90 tablet 1   clobetasol ointment (  TEMOVATE) 0.05 % Apply 1 application topically 2 (two) times daily. To rash on body 60 g 4   diclofenac (VOLTAREN) 75 MG EC tablet Take 1 tablet (75 mg total) by mouth 2 (two) times daily. 60 tablet 0   fluticasone-salmeterol (ADVAIR) 250-50 MCG/ACT AEPB Inhale 1 puff into the lungs two times daily 60 each 6   folic acid (FOLVITE) 1 MG tablet TAKE 1 TABLET BY MOUTH ONCE A DAY 30 tablet 0   folic acid (FOLVITE) 1 MG tablet TAKE 1 TABLET BY MOUTH ONCE DAILY 30 tablet 5   hydrocortisone (ANUSOL-HC) 25 MG suppository UNWRAP AND INSERT 1 SUPPOSITORY RECTALLY 2 TIMES DAILY AS NEEDED FOR HEMORRHOIDS OR ANAL ITCHING. 12 suppository 3   hydrocortisone cream 1 % Apply 1  application topically 2 (two) times daily. 30 g 0   meloxicam (MOBIC) 7.5 MG tablet Take 7.5 mg by mouth daily.     methotrexate (RHEUMATREX) 2.5 MG tablet Take 8  tablets by mouth once a week on the same day 32 tablet 0   nitroGLYCERIN (NITRODUR - DOSED IN MG/24 HR) 0.2 mg/hr patch APPLY 1/4 PATCH TOPICALLY TO TENDON FOR TENDONITIS 30 patch 1   pantoprazole (PROTONIX) 40 MG tablet TAKE 1 TABLET BY MOUTH ONCE DAILY 90 tablet 3   VENTOLIN HFA 108 (90 Base) MCG/ACT inhaler INHALE 2 PUFFS BY MOUTH INTO THE LUNGS EVERY 6 HOURS AS NEEDED FOR WHEEZING OR SHORTNESS OF BREATH 18 g 2   No current facility-administered medications on file prior to visit.    There were no vitals taken for this visit.       Objective:   Physical Exam Vitals and nursing note reviewed.  Constitutional:      General: He is not in acute distress.    Appearance: Normal appearance. He is well-developed and normal weight.  HENT:     Head: Normocephalic and atraumatic.     Right Ear: Tympanic membrane, ear canal and external ear normal. There is no impacted cerumen.     Left Ear: Tympanic membrane, ear canal and external ear normal. There is no impacted cerumen.     Nose: Nose normal. No congestion or rhinorrhea.     Mouth/Throat:     Mouth: Mucous membranes are moist.     Pharynx: Oropharynx is clear. No oropharyngeal exudate or posterior oropharyngeal erythema.  Eyes:     General:        Right eye: No discharge.        Left eye: No discharge.     Extraocular Movements: Extraocular movements intact.     Conjunctiva/sclera: Conjunctivae normal.     Pupils: Pupils are equal, round, and reactive to light.  Neck:     Vascular: No carotid bruit.     Trachea: No tracheal deviation.  Cardiovascular:     Rate and Rhythm: Normal rate and regular rhythm.     Pulses: Normal pulses.     Heart sounds: Normal heart sounds. No murmur heard.   No friction rub. No gallop.  Pulmonary:     Effort: Pulmonary effort is normal.  No respiratory distress.     Breath sounds: Normal breath sounds. No stridor. No wheezing, rhonchi or rales.  Chest:     Chest wall: No tenderness.  Abdominal:     General: Bowel sounds are normal. There is no distension.     Palpations: Abdomen is soft. There is no mass.     Tenderness: There is no abdominal tenderness. There is no right  CVA tenderness, left CVA tenderness, guarding or rebound.     Hernia: No hernia is present.  Musculoskeletal:        General: No swelling, tenderness, deformity or signs of injury. Normal range of motion.     Right lower leg: No edema.     Left lower leg: No edema.  Lymphadenopathy:     Cervical: No cervical adenopathy.  Skin:    General: Skin is warm and dry.     Capillary Refill: Capillary refill takes less than 2 seconds.     Coloration: Skin is not jaundiced or pale.     Findings: No bruising, erythema, lesion or rash.  Neurological:     General: No focal deficit present.     Mental Status: He is alert and oriented to person, place, and time.     Cranial Nerves: No cranial nerve deficit.     Sensory: No sensory deficit.     Motor: No weakness.     Coordination: Coordination normal.     Gait: Gait normal.     Deep Tendon Reflexes: Reflexes normal.  Psychiatric:        Mood and Affect: Mood normal.        Behavior: Behavior normal.        Thought Content: Thought content normal.        Judgment: Judgment normal.      Assessment & Plan:

## 2021-05-30 ENCOUNTER — Other Ambulatory Visit: Payer: Self-pay

## 2021-05-31 ENCOUNTER — Encounter: Payer: Self-pay | Admitting: Adult Health

## 2021-06-03 IMAGING — DX DG PELVIS 1-2V
1 series · 1 of 1 positions shown · non-contrast
Comparison: None.

CLINICAL DATA: Left hip pain

EXAM:
PELVIS - 1-2 VIEW

[hip ap]
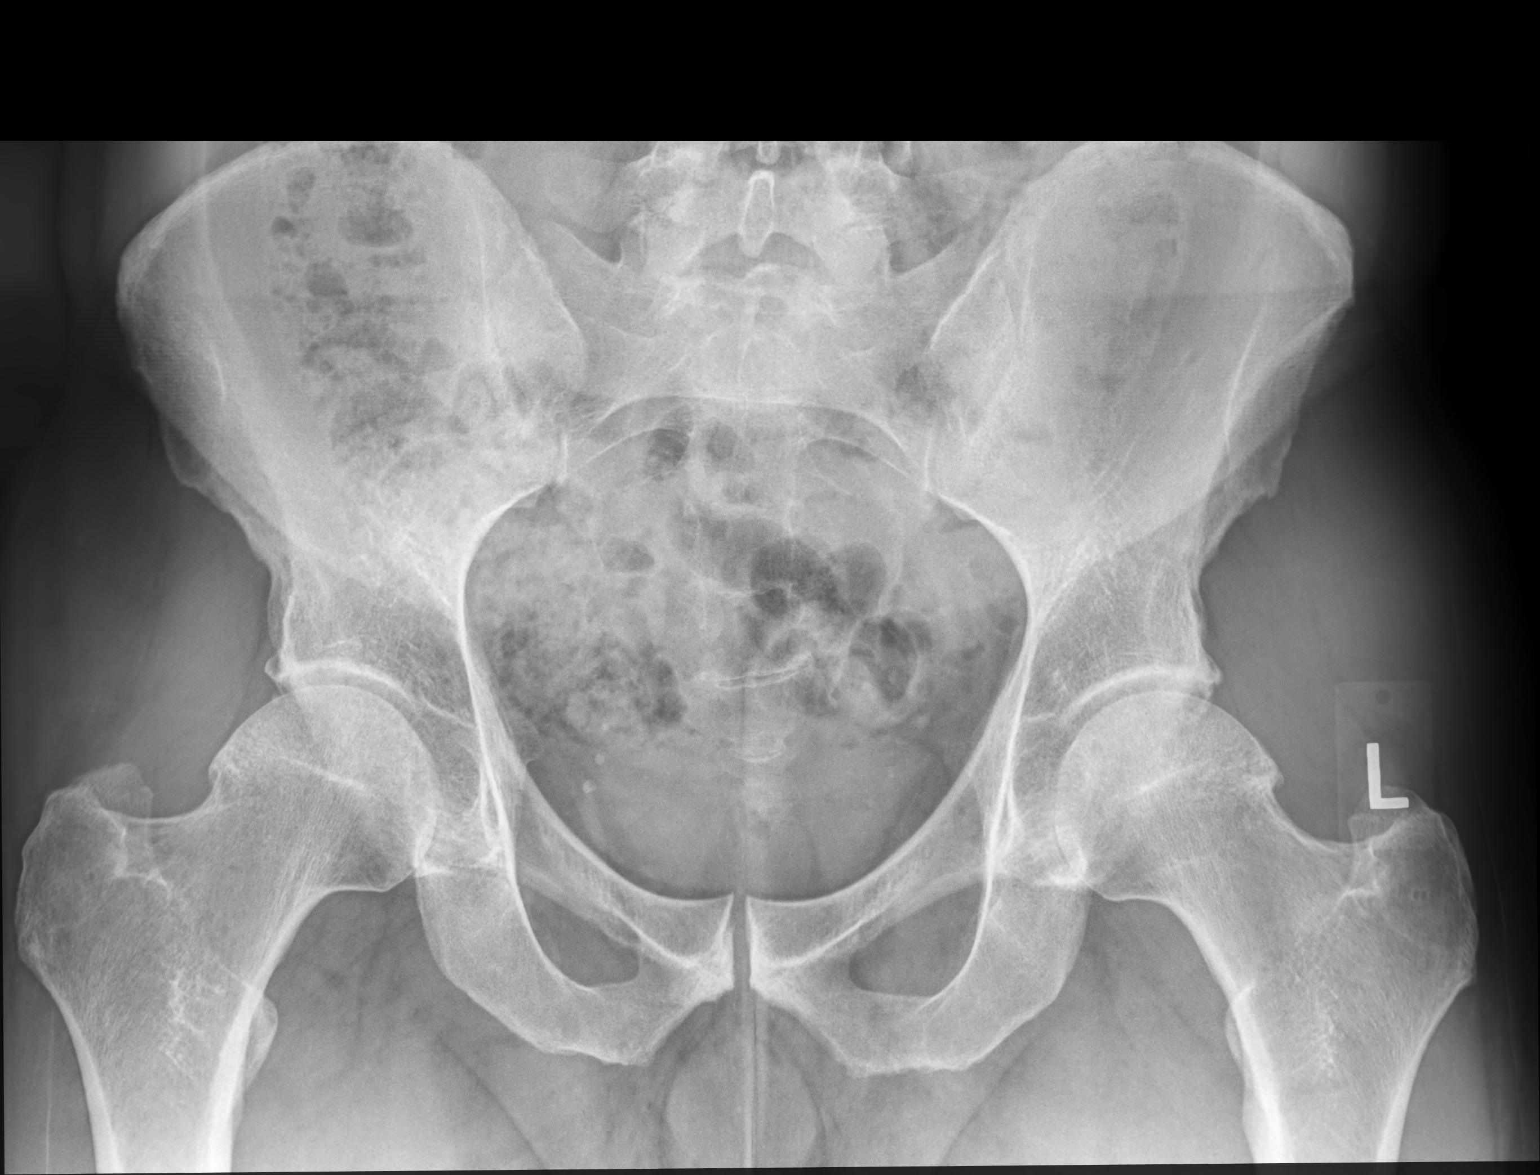

[1 of 1 positions shown; findings below may reference images not displayed]

FINDINGS: Early spurring in the hip joints bilaterally. SI joints symmetric
and unremarkable. No acute bony abnormality. Specifically, no
fracture, subluxation, or dislocation.
IMPRESSION: Early spurring within the hip joints bilaterally. No acute bony
abnormality.

## 2021-06-26 ENCOUNTER — Other Ambulatory Visit: Payer: Self-pay

## 2021-06-27 ENCOUNTER — Ambulatory Visit (INDEPENDENT_AMBULATORY_CARE_PROVIDER_SITE_OTHER): Payer: BLUE CROSS/BLUE SHIELD | Admitting: Adult Health

## 2021-06-27 ENCOUNTER — Encounter: Payer: Self-pay | Admitting: Adult Health

## 2021-06-27 VITALS — BP 110/80 | HR 74 | Temp 97.8°F | Ht 73.0 in | Wt 195.0 lb

## 2021-06-27 DIAGNOSIS — L4050 Arthropathic psoriasis, unspecified: Secondary | ICD-10-CM

## 2021-06-27 DIAGNOSIS — J4520 Mild intermittent asthma, uncomplicated: Secondary | ICD-10-CM | POA: Diagnosis not present

## 2021-06-27 DIAGNOSIS — F411 Generalized anxiety disorder: Secondary | ICD-10-CM | POA: Diagnosis not present

## 2021-06-27 DIAGNOSIS — Z0000 Encounter for general adult medical examination without abnormal findings: Secondary | ICD-10-CM

## 2021-06-27 NOTE — Patient Instructions (Signed)
Health Maintenance Due  Topic Date Due   COVID-19 Vaccine (1) Never done   Hepatitis C Screening  Never done   Pneumococcal Vaccine 11-43 Years old (2 - PPSV23 if available, else PCV20) 07/12/2016   INFLUENZA VACCINE  03/25/2021    Depression screen [facility] 2/9 08/05/2017 06/26/2017 07/12/2015  Decreased Interest 0 0 0  Down, Depressed, Hopeless 0 0 0  PHQ - 2 Score 0 0 0

## 2021-06-27 NOTE — Progress Notes (Signed)
Subjective:    Patient ID: Alan Brooks, male    DOB: 02-10-1976, 45 y.o.   MRN: [id number]  HPI Patient presents for yearly preventative medicine examination. He is a pleasant 45 year old male who  has a past medical history of Anxiety, GERD (gastroesophageal reflux disease), Mild asthma, and Psoriatic arthritis (HCC).  Asthma -controlled with Advair daily, does have an albuterol inhaler that he uses infrequently.  Anxiety - takes Celexa 40 mg - well controlled.   Psoriatic Arthritis -managed by [facility].  Currently prescribed Humira 40 mg every other week. Reports feeling " 99% better"  GERD - Takes Protonix 40 mg daily. Feels controlled.   All immunizations and health maintenance protocols were reviewed with the patient and needed orders were placed.  Appropriate screening laboratory values were ordered for the patient including screening of hyperlipidemia, renal function and hepatic function.  Medication reconciliation,  past medical history, social history, problem list and allergies were reviewed in detail with the patient  Goals were established with regard to weight loss, exercise, and  diet in compliance with medications. He is staying very active  Review of Systems  Constitutional: Negative.   HENT: Negative.    Eyes: Negative.   Respiratory: Negative.    Cardiovascular: Negative.   Gastrointestinal: Negative.   Endocrine: Negative.   Genitourinary: Negative.   Musculoskeletal: Negative.   Skin: Negative.   Allergic/Immunologic: Negative.   Neurological: Negative.   Hematological: Negative.   Psychiatric/Behavioral: Negative.    All other systems reviewed and are negative.  Past Medical History:  Diagnosis Date   Anxiety    GERD (gastroesophageal reflux disease)    Mild asthma    Psoriatic arthritis (HCC)     Social History   Socioeconomic History   Marital status: Single    Spouse name: Not on file   Number of children: Not on  file   Years of education: Not on file   Highest education level: Not on file  Occupational History   Not on file  Tobacco Use   Smoking status: Former    Packs/day: 1.00    Years: 15.00    Pack years: 15.00    Types: Cigarettes   Smokeless tobacco: Never  Substance and Sexual Activity   Alcohol use: No   Drug use: No   Sexual activity: Not on file  Other Topics Concern   Not on file  Social History Narrative   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 No pertinent surgical history.  Family History  Problem Relation Age of Onset   Heart attack Maternal Grandfather    Heart attack Paternal Grandfather    Asthma Brother    Depression Other        entire family    Anxiety disorder Other        entire family     No Known Allergies  Current Outpatient Medications on File Prior to Visit  Medication Sig Dispense Refill   citalopram (CELEXA) 40 MG tablet TAKE 1 TABLET BY MOUTH ONCE A DAY 90 tablet 1  clobetasol ointment (TEMOVATE) 0.05 % Apply 1 application topically 2 (two) times daily. To rash on body 60 g 4   diclofenac (VOLTAREN) 75 MG EC tablet Take 1 tablet (75 mg total) by mouth 2 (two) times daily. 60 tablet 0   fluticasone-salmeterol (ADVAIR) 250-50 MCG/ACT AEPB Inhale 1 puff into the lungs two times daily 60 each 6   hydrocortisone cream 1 % Apply 1 application topically 2 (two) times daily. 30 g 0   meloxicam (MOBIC) 7.5 MG tablet Take 7.5 mg by mouth daily.     pantoprazole (PROTONIX) 40 MG tablet TAKE 1 TABLET BY MOUTH ONCE DAILY 90 tablet 3   VENTOLIN HFA 108 (90 Base) MCG/ACT inhaler INHALE 2 PUFFS BY MOUTH  INTO THE LUNGS EVERY 6 HOURS AS NEEDED FOR WHEEZING OR SHORTNESS OF BREATH 18 g 2   HUMIRA PEN 40 MG/0.4ML PNKT SMARTSIG:40 Milligram(s) SUB-Q Every 2 Weeks     No current facility-administered medications on file prior to visit.    BP 110/80   Pulse 74   Temp 97.8 F (36.6 C) (Oral)   Ht 6\' 1"  (1.854 m)   Wt 195 lb (88.5 kg)   SpO2 97%   BMI 25.73 kg/m        Objective:   Physical Exam Vitals and nursing note reviewed.  Constitutional:      General: He is not in acute distress.    Appearance: Normal appearance. He is well-developed and normal weight.  HENT:     Head: Normocephalic and atraumatic.     Right Ear: Tympanic membrane, ear canal and external ear normal. There is no impacted cerumen.     Left Ear: Tympanic membrane, ear canal and external ear normal. There is no impacted cerumen.     Nose: Nose normal. No congestion or rhinorrhea.     Mouth/Throat:     Mouth: Mucous membranes are moist.     Pharynx: Oropharynx is clear. No oropharyngeal exudate or posterior oropharyngeal erythema.  Eyes:     General:        Right eye: No discharge.        Left eye: No discharge.     Extraocular Movements: Extraocular movements intact.     Conjunctiva/sclera: Conjunctivae normal.     Pupils: Pupils are equal, round, and reactive to light.  Neck:     Vascular: No carotid bruit.     Trachea: No tracheal deviation.  Cardiovascular:     Rate and Rhythm: Normal rate and regular rhythm.     Pulses: Normal pulses.     Heart sounds: Normal heart sounds. No murmur heard.   No friction rub. No gallop.  Pulmonary:     Effort: Pulmonary effort is normal. No respiratory distress.     Breath sounds: Normal breath sounds. No stridor. No wheezing, rhonchi or rales.  Chest:     Chest wall: No tenderness.  Abdominal:     General: Bowel sounds are normal. There is no distension.     Palpations: Abdomen is soft. There is no mass.     Tenderness: There is no abdominal tenderness. There is  no right CVA tenderness, left CVA tenderness, guarding or rebound.     Hernia: No hernia is present.  Musculoskeletal:        General: No swelling, tenderness, deformity or signs of injury. Normal range of motion.     Right lower leg: No edema.     Left lower leg: No edema.  Lymphadenopathy:  Cervical: No cervical adenopathy.  Skin:    General: Skin is warm and dry.     Capillary Refill: Capillary refill takes less than 2 seconds.     Coloration: Skin is not jaundiced or pale.     Findings: No bruising, erythema, lesion or rash.  Neurological:     General: No focal deficit present.     Mental Status: He is alert and oriented to person, place, and time.     Cranial Nerves: No cranial nerve deficit.     Sensory: No sensory deficit.     Motor: No weakness.     Coordination: Coordination normal.     Gait: Gait normal.     Deep Tendon Reflexes: Reflexes normal.  Psychiatric:        Mood and Affect: Mood normal.        Behavior: Behavior normal.        Thought Content: Thought content normal.        Judgment: Judgment normal.       Assessment & Plan:  1. Routine general medical examination at a health care facility - Benign exam  - Will forgo labs this year, he has routine labs done at Rheumatology and has started having syncopal episodes during labs draw  - Follow up in one year or sooner if needed  2. Anxiety state - Continue with Celexa 40 mg   3. Mild intermittent asthma without complication - Continue with Advair daily.   4. Psoriatic arthritis (HCC) - Doing well with Humira  - Follow up with Rheumatology as directed  Shirline Frees, NP

## 2021-07-29 ENCOUNTER — Other Ambulatory Visit (HOSPITAL_COMMUNITY): Payer: Self-pay

## 2021-08-26 ENCOUNTER — Other Ambulatory Visit (HOSPITAL_COMMUNITY): Payer: Self-pay

## 2021-08-26 ENCOUNTER — Other Ambulatory Visit: Payer: Self-pay | Admitting: Adult Health

## 2021-08-27 ENCOUNTER — Other Ambulatory Visit (HOSPITAL_COMMUNITY): Payer: Self-pay

## 2021-08-27 MED ORDER — CITALOPRAM HYDROBROMIDE 40 MG PO TABS
ORAL_TABLET | Freq: Every day | ORAL | 1 refills | Status: DC
  Filled 2021-08-27: qty 90, 90d supply, fill #0
  Filled 2021-11-28: qty 90, 90d supply, fill #1

## 2021-08-27 NOTE — Telephone Encounter (Signed)
Patient called to follow up on Citalopram Hydrobromide 40 MG being sent to  [facility] Phone:  [phone]  Fax:  [phone]         Please advise

## 2021-08-29 ENCOUNTER — Other Ambulatory Visit (HOSPITAL_COMMUNITY): Payer: Self-pay

## 2021-09-30 ENCOUNTER — Other Ambulatory Visit (HOSPITAL_COMMUNITY): Payer: Self-pay

## 2021-10-01 ENCOUNTER — Other Ambulatory Visit (HOSPITAL_COMMUNITY): Payer: Self-pay

## 2021-10-02 ENCOUNTER — Other Ambulatory Visit (HOSPITAL_COMMUNITY): Payer: Self-pay

## 2021-11-28 ENCOUNTER — Other Ambulatory Visit (HOSPITAL_COMMUNITY): Payer: Self-pay

## 2022-03-07 ENCOUNTER — Other Ambulatory Visit (HOSPITAL_COMMUNITY): Payer: Self-pay

## 2022-03-07 ENCOUNTER — Other Ambulatory Visit: Payer: Self-pay

## 2022-03-07 ENCOUNTER — Other Ambulatory Visit: Payer: Self-pay | Admitting: Adult Health

## 2022-03-07 DIAGNOSIS — Z0000 Encounter for general adult medical examination without abnormal findings: Secondary | ICD-10-CM

## 2022-03-11 ENCOUNTER — Other Ambulatory Visit: Payer: Self-pay | Admitting: Adult Health

## 2022-03-11 ENCOUNTER — Other Ambulatory Visit (HOSPITAL_COMMUNITY): Payer: Self-pay

## 2022-03-11 DIAGNOSIS — Z0000 Encounter for general adult medical examination without abnormal findings: Secondary | ICD-10-CM

## 2022-03-12 ENCOUNTER — Other Ambulatory Visit (HOSPITAL_COMMUNITY): Payer: Self-pay

## 2022-03-12 MED ORDER — CITALOPRAM HYDROBROMIDE 40 MG PO TABS
ORAL_TABLET | Freq: Every day | ORAL | 1 refills | Status: AC
  Filled 2022-03-12: qty 90, 90d supply, fill #0
  Filled 2022-06-16: qty 90, 90d supply, fill #1

## 2022-03-12 MED ORDER — PANTOPRAZOLE SODIUM 40 MG PO TBEC
DELAYED_RELEASE_TABLET | Freq: Every day | ORAL | 3 refills | Status: AC
  Filled 2022-03-12: qty 90, 90d supply, fill #0
  Filled 2022-06-16: qty 90, 90d supply, fill #1

## 2022-03-14 ENCOUNTER — Other Ambulatory Visit (HOSPITAL_COMMUNITY): Payer: Self-pay

## 2022-03-14 MED ORDER — PANTOPRAZOLE SODIUM 40 MG PO TBEC
40.0000 mg | DELAYED_RELEASE_TABLET | Freq: Every day | ORAL | 1 refills | Status: DC
  Filled 2022-03-14 – 2022-09-08 (×2): qty 90, 90d supply, fill #0
  Filled 2022-12-16: qty 90, 90d supply, fill #1

## 2022-03-14 MED ORDER — FLUTICASONE-SALMETEROL 250-50 MCG/ACT IN AEPB
INHALATION_SPRAY | RESPIRATORY_TRACT | 1 refills | Status: DC
  Filled 2022-03-14 – 2022-05-27 (×2): qty 60, 30d supply, fill #0
  Filled 2022-06-16 – 2022-07-29 (×2): qty 60, 30d supply, fill #1

## 2022-03-14 MED ORDER — CITALOPRAM HYDROBROMIDE 40 MG PO TABS
40.0000 mg | ORAL_TABLET | Freq: Every day | ORAL | 0 refills | Status: DC
  Filled 2022-03-14 – 2022-09-08 (×2): qty 90, 90d supply, fill #0

## 2022-04-18 ENCOUNTER — Other Ambulatory Visit (HOSPITAL_COMMUNITY): Payer: Self-pay

## 2022-04-18 ENCOUNTER — Other Ambulatory Visit: Payer: Self-pay | Admitting: Adult Health

## 2022-04-19 ENCOUNTER — Other Ambulatory Visit (HOSPITAL_COMMUNITY): Payer: Self-pay

## 2022-04-22 ENCOUNTER — Other Ambulatory Visit (HOSPITAL_COMMUNITY): Payer: Self-pay

## 2022-04-22 MED ORDER — ALBUTEROL SULFATE HFA 108 (90 BASE) MCG/ACT IN AERS
2.0000 | INHALATION_SPRAY | Freq: Four times a day (QID) | RESPIRATORY_TRACT | 2 refills | Status: AC | PRN
  Filled 2022-04-22: qty 6.7, 25d supply, fill #0
  Filled 2023-04-22: qty 6.7, 25d supply, fill #1

## 2022-05-27 ENCOUNTER — Other Ambulatory Visit (HOSPITAL_COMMUNITY): Payer: Self-pay

## 2022-06-16 ENCOUNTER — Other Ambulatory Visit (HOSPITAL_COMMUNITY): Payer: Self-pay

## 2022-06-17 ENCOUNTER — Other Ambulatory Visit (HOSPITAL_COMMUNITY): Payer: Self-pay

## 2022-07-07 ENCOUNTER — Other Ambulatory Visit (HOSPITAL_COMMUNITY): Payer: Self-pay

## 2022-07-07 MED ORDER — HYDROXYZINE HCL 10 MG PO TABS
10.0000 mg | ORAL_TABLET | Freq: Every evening | ORAL | 2 refills | Status: AC
  Filled 2022-07-07: qty 30, 30d supply, fill #0
  Filled 2022-09-08: qty 30, 30d supply, fill #1

## 2022-07-29 ENCOUNTER — Other Ambulatory Visit (HOSPITAL_COMMUNITY): Payer: Self-pay

## 2022-07-30 ENCOUNTER — Other Ambulatory Visit (HOSPITAL_COMMUNITY): Payer: Self-pay

## 2022-09-08 ENCOUNTER — Other Ambulatory Visit (HOSPITAL_COMMUNITY): Payer: Self-pay

## 2022-09-08 ENCOUNTER — Other Ambulatory Visit: Payer: Self-pay

## 2022-09-09 ENCOUNTER — Other Ambulatory Visit: Payer: Self-pay

## 2022-09-09 ENCOUNTER — Other Ambulatory Visit (HOSPITAL_COMMUNITY): Payer: Self-pay

## 2022-09-09 MED ORDER — FLUTICASONE-SALMETEROL 250-50 MCG/ACT IN AEPB
1.0000 | INHALATION_SPRAY | Freq: Every day | RESPIRATORY_TRACT | 1 refills | Status: AC | PRN
  Filled 2022-09-09: qty 60, 60d supply, fill #0
  Filled 2022-12-16: qty 60, 60d supply, fill #1

## 2022-11-03 ENCOUNTER — Other Ambulatory Visit (HOSPITAL_COMMUNITY): Payer: Self-pay

## 2022-11-03 MED ORDER — PREDNISONE 20 MG PO TABS
40.0000 mg | ORAL_TABLET | Freq: Every day | ORAL | 0 refills | Status: AC
  Filled 2022-11-03: qty 10, 5d supply, fill #0

## 2022-12-16 ENCOUNTER — Other Ambulatory Visit (HOSPITAL_COMMUNITY): Payer: Self-pay

## 2022-12-16 MED ORDER — CITALOPRAM HYDROBROMIDE 40 MG PO TABS
40.0000 mg | ORAL_TABLET | Freq: Every day | ORAL | 0 refills | Status: DC
  Filled 2022-12-16: qty 90, 90d supply, fill #0

## 2023-02-23 ENCOUNTER — Other Ambulatory Visit (HOSPITAL_COMMUNITY): Payer: Self-pay

## 2023-02-24 ENCOUNTER — Other Ambulatory Visit (HOSPITAL_COMMUNITY): Payer: Self-pay

## 2023-02-27 ENCOUNTER — Other Ambulatory Visit (HOSPITAL_COMMUNITY): Payer: Self-pay

## 2023-02-27 MED ORDER — FLUTICASONE-SALMETEROL 250-50 MCG/ACT IN AEPB
1.0000 | INHALATION_SPRAY | RESPIRATORY_TRACT | 0 refills | Status: DC
  Filled 2023-02-27: qty 60, 30d supply, fill #0

## 2023-03-18 ENCOUNTER — Other Ambulatory Visit (HOSPITAL_COMMUNITY): Payer: Self-pay

## 2023-03-19 ENCOUNTER — Other Ambulatory Visit (HOSPITAL_COMMUNITY): Payer: Self-pay

## 2023-03-19 ENCOUNTER — Other Ambulatory Visit: Payer: Self-pay

## 2023-03-19 MED ORDER — CITALOPRAM HYDROBROMIDE 40 MG PO TABS
40.0000 mg | ORAL_TABLET | Freq: Every day | ORAL | 1 refills | Status: AC
  Filled 2023-03-19: qty 90, 90d supply, fill #0
  Filled 2023-11-10 – 2023-11-12 (×3): qty 90, 90d supply, fill #1

## 2023-03-19 MED ORDER — FLUTICASONE-SALMETEROL 250-50 MCG/ACT IN AEPB
1.0000 | INHALATION_SPRAY | Freq: Two times a day (BID) | RESPIRATORY_TRACT | 0 refills | Status: DC
  Filled 2023-03-19 – 2023-03-26 (×3): qty 60, 30d supply, fill #0

## 2023-03-19 MED ORDER — PANTOPRAZOLE SODIUM 40 MG PO TBEC
40.0000 mg | DELAYED_RELEASE_TABLET | Freq: Every day | ORAL | 1 refills | Status: DC
  Filled 2023-03-19: qty 90, 90d supply, fill #0
  Filled 2023-08-03: qty 90, 90d supply, fill #1

## 2023-03-20 ENCOUNTER — Other Ambulatory Visit (HOSPITAL_COMMUNITY): Payer: Self-pay

## 2023-03-26 ENCOUNTER — Other Ambulatory Visit (HOSPITAL_COMMUNITY): Payer: Self-pay

## 2023-03-26 ENCOUNTER — Other Ambulatory Visit: Payer: Self-pay

## 2023-04-22 ENCOUNTER — Other Ambulatory Visit (HOSPITAL_COMMUNITY): Payer: Self-pay

## 2023-07-09 ENCOUNTER — Other Ambulatory Visit (HOSPITAL_COMMUNITY): Payer: Self-pay

## 2023-07-09 ENCOUNTER — Other Ambulatory Visit: Payer: Self-pay

## 2023-07-09 MED ORDER — HYDROXYZINE HCL 10 MG PO TABS: 10.0000 mg | ORAL_TABLET | Freq: Every evening | ORAL | 2 refills | Status: AC | PRN

## 2023-07-09 MED ORDER — CITALOPRAM HYDROBROMIDE 40 MG PO TABS
40.0000 mg | ORAL_TABLET | Freq: Every day | ORAL | 1 refills | Status: DC
  Filled 2023-07-09 (×2): qty 90, 90d supply, fill #0
  Filled 2024-02-23 (×2): qty 90, 90d supply, fill #1

## 2023-07-09 MED ORDER — LISDEXAMFETAMINE DIMESYLATE 10 MG PO CAPS
10.0000 mg | ORAL_CAPSULE | Freq: Every day | ORAL | 0 refills | Status: AC | PRN
  Filled 2023-07-09 (×2): qty 30, 30d supply, fill #0

## 2023-07-09 MED ORDER — HYDROXYZINE HCL 10 MG PO TABS
10.0000 mg | ORAL_TABLET | Freq: Every evening | ORAL | 2 refills | Status: DC | PRN
  Filled 2023-07-09 (×2): qty 30, 30d supply, fill #0

## 2023-07-09 MED ORDER — DESONIDE 0.05 % EX CREA
1.0000 | TOPICAL_CREAM | Freq: Two times a day (BID) | CUTANEOUS | 0 refills | Status: DC
  Filled 2023-07-09 (×2): qty 15, 8d supply, fill #0

## 2023-07-20 ENCOUNTER — Other Ambulatory Visit (HOSPITAL_COMMUNITY): Payer: Self-pay

## 2023-08-03 ENCOUNTER — Other Ambulatory Visit (HOSPITAL_COMMUNITY): Payer: Self-pay

## 2023-09-14 ENCOUNTER — Other Ambulatory Visit (HOSPITAL_COMMUNITY): Payer: Self-pay

## 2023-09-15 ENCOUNTER — Other Ambulatory Visit (HOSPITAL_COMMUNITY): Payer: Self-pay

## 2023-09-15 ENCOUNTER — Other Ambulatory Visit: Payer: Self-pay

## 2023-09-15 MED ORDER — FLUTICASONE-SALMETEROL 250-50 MCG/ACT IN AEPB
1.0000 | INHALATION_SPRAY | Freq: Two times a day (BID) | RESPIRATORY_TRACT | 3 refills | Status: AC
  Filled 2023-09-15: qty 60, 30d supply, fill #0

## 2023-11-10 ENCOUNTER — Other Ambulatory Visit (HOSPITAL_COMMUNITY): Payer: Self-pay

## 2023-11-10 ENCOUNTER — Other Ambulatory Visit: Payer: Self-pay

## 2023-11-10 MED ORDER — PANTOPRAZOLE SODIUM 40 MG PO TBEC
40.0000 mg | DELAYED_RELEASE_TABLET | Freq: Every day | ORAL | 1 refills | Status: DC
  Filled 2023-11-10: qty 90, 90d supply, fill #0
  Filled 2024-02-23 (×2): qty 90, 90d supply, fill #1

## 2023-11-12 ENCOUNTER — Other Ambulatory Visit (HOSPITAL_COMMUNITY): Payer: Self-pay

## 2023-11-13 ENCOUNTER — Other Ambulatory Visit (HOSPITAL_COMMUNITY): Payer: Self-pay

## 2024-02-23 ENCOUNTER — Other Ambulatory Visit (HOSPITAL_COMMUNITY): Payer: Self-pay

## 2024-05-28 ENCOUNTER — Other Ambulatory Visit (HOSPITAL_COMMUNITY): Payer: Self-pay

## 2024-05-28 MED ORDER — CITALOPRAM HYDROBROMIDE 40 MG PO TABS
40.0000 mg | ORAL_TABLET | Freq: Every day | ORAL | 0 refills | Status: AC
  Filled 2024-05-28 – 2024-05-30 (×2): qty 30, 30d supply, fill #0

## 2024-05-28 MED ORDER — PANTOPRAZOLE SODIUM 40 MG PO TBEC
40.0000 mg | DELAYED_RELEASE_TABLET | Freq: Every day | ORAL | 1 refills | Status: AC
  Filled 2024-05-28 – 2024-05-30 (×2): qty 90, 90d supply, fill #0

## 2024-05-30 ENCOUNTER — Other Ambulatory Visit (HOSPITAL_COMMUNITY): Payer: Self-pay

## 2024-05-30 ENCOUNTER — Other Ambulatory Visit: Payer: Self-pay

## 2024-05-31 ENCOUNTER — Other Ambulatory Visit: Payer: Self-pay

## 2024-05-31 ENCOUNTER — Other Ambulatory Visit (HOSPITAL_COMMUNITY): Payer: Self-pay

## 2024-06-01 ENCOUNTER — Other Ambulatory Visit: Payer: Self-pay
